# Patient Record
Sex: Female | Born: 1935 | Race: White | Hispanic: No | Marital: Married | Smoking: Never smoker
Health system: Southern US, Community
[De-identification: ages and names within clinical notes are randomized; demographics above are authoritative.]

## PROBLEM LIST (undated history)

## (undated) DIAGNOSIS — N189 Chronic kidney disease, unspecified: Secondary | ICD-10-CM

## (undated) DIAGNOSIS — K5900 Constipation, unspecified: Secondary | ICD-10-CM

## (undated) DIAGNOSIS — E079 Disorder of thyroid, unspecified: Secondary | ICD-10-CM

## (undated) DIAGNOSIS — G4700 Insomnia, unspecified: Secondary | ICD-10-CM

## (undated) DIAGNOSIS — E039 Hypothyroidism, unspecified: Secondary | ICD-10-CM

## (undated) DIAGNOSIS — H409 Unspecified glaucoma: Secondary | ICD-10-CM

## (undated) DIAGNOSIS — D649 Anemia, unspecified: Secondary | ICD-10-CM

## (undated) DIAGNOSIS — C539 Malignant neoplasm of cervix uteri, unspecified: Secondary | ICD-10-CM

## (undated) DIAGNOSIS — K219 Gastro-esophageal reflux disease without esophagitis: Secondary | ICD-10-CM

## (undated) DIAGNOSIS — I10 Essential (primary) hypertension: Secondary | ICD-10-CM

## (undated) DIAGNOSIS — M4800 Spinal stenosis, site unspecified: Secondary | ICD-10-CM

## (undated) DIAGNOSIS — I509 Heart failure, unspecified: Secondary | ICD-10-CM

## (undated) HISTORY — DX: Gastro-esophageal reflux disease without esophagitis: K21.9

## (undated) HISTORY — DX: Malignant neoplasm of cervix uteri, unspecified: C53.9

## (undated) HISTORY — DX: Chronic kidney disease, unspecified: N18.9

## (undated) HISTORY — PX: JOINT REPLACEMENT: SHX530

## (undated) HISTORY — DX: Constipation, unspecified: K59.00

## (undated) HISTORY — PX: ABDOMINAL HYSTERECTOMY: SHX81

## (undated) HISTORY — PX: HIP SURGERY: SHX245

## (undated) HISTORY — DX: Insomnia, unspecified: G47.00

## (undated) HISTORY — DX: Heart failure, unspecified: I50.9

## (undated) HISTORY — PX: BACK SURGERY: SHX140

## (undated) HISTORY — PX: PACEMAKER INSERTION: SHX728

## (undated) HISTORY — PX: SPINE SURGERY: SHX786

---

## 2003-06-04 ENCOUNTER — Encounter: Payer: Self-pay | Admitting: Neurosurgery

## 2003-06-04 ENCOUNTER — Encounter: Admission: RE | Admit: 2003-06-04 | Discharge: 2003-06-04 | Payer: Self-pay | Admitting: Neurosurgery

## 2003-06-19 ENCOUNTER — Encounter: Admission: RE | Admit: 2003-06-19 | Discharge: 2003-06-19 | Payer: Self-pay | Admitting: Neurosurgery

## 2003-06-19 ENCOUNTER — Encounter: Payer: Self-pay | Admitting: Neurosurgery

## 2004-03-11 ENCOUNTER — Ambulatory Visit (HOSPITAL_COMMUNITY): Admission: RE | Admit: 2004-03-11 | Discharge: 2004-03-11 | Payer: Self-pay | Admitting: Neurosurgery

## 2004-05-10 ENCOUNTER — Inpatient Hospital Stay (HOSPITAL_COMMUNITY): Admission: AD | Admit: 2004-05-10 | Discharge: 2004-05-12 | Payer: Self-pay | Admitting: Neurosurgery

## 2004-11-22 ENCOUNTER — Ambulatory Visit: Payer: Self-pay | Admitting: Gastroenterology

## 2005-06-21 ENCOUNTER — Ambulatory Visit: Payer: Self-pay | Admitting: Gastroenterology

## 2005-07-21 ENCOUNTER — Ambulatory Visit: Payer: Self-pay | Admitting: Internal Medicine

## 2006-07-23 ENCOUNTER — Ambulatory Visit: Payer: Self-pay | Admitting: Internal Medicine

## 2006-10-02 ENCOUNTER — Other Ambulatory Visit: Payer: Self-pay

## 2006-10-02 ENCOUNTER — Ambulatory Visit: Payer: Self-pay | Admitting: Unknown Physician Specialty

## 2007-07-04 ENCOUNTER — Ambulatory Visit: Payer: Self-pay

## 2007-08-05 ENCOUNTER — Ambulatory Visit: Payer: Self-pay

## 2007-09-10 ENCOUNTER — Ambulatory Visit: Payer: Self-pay | Admitting: Internal Medicine

## 2007-12-12 ENCOUNTER — Ambulatory Visit: Payer: Self-pay

## 2008-10-07 ENCOUNTER — Ambulatory Visit: Payer: Self-pay | Admitting: Internal Medicine

## 2009-02-25 ENCOUNTER — Ambulatory Visit: Payer: Self-pay | Admitting: Internal Medicine

## 2009-07-07 ENCOUNTER — Ambulatory Visit: Payer: Self-pay | Admitting: Internal Medicine

## 2009-12-08 ENCOUNTER — Ambulatory Visit: Payer: Self-pay | Admitting: Internal Medicine

## 2010-07-28 ENCOUNTER — Ambulatory Visit: Payer: Self-pay | Admitting: Internal Medicine

## 2010-08-02 ENCOUNTER — Ambulatory Visit: Payer: Self-pay | Admitting: Internal Medicine

## 2010-11-10 ENCOUNTER — Ambulatory Visit: Payer: Self-pay | Admitting: Internal Medicine

## 2011-01-30 ENCOUNTER — Ambulatory Visit: Payer: Self-pay | Admitting: Internal Medicine

## 2011-04-13 ENCOUNTER — Ambulatory Visit: Payer: Self-pay | Admitting: Vascular Surgery

## 2011-04-19 ENCOUNTER — Ambulatory Visit: Payer: Self-pay | Admitting: Vascular Surgery

## 2011-04-26 ENCOUNTER — Inpatient Hospital Stay: Payer: Self-pay | Admitting: Vascular Surgery

## 2011-04-28 LAB — PATHOLOGY REPORT

## 2011-08-04 ENCOUNTER — Ambulatory Visit: Payer: Self-pay | Admitting: Internal Medicine

## 2011-09-20 ENCOUNTER — Ambulatory Visit: Payer: Self-pay | Admitting: Internal Medicine

## 2011-09-25 ENCOUNTER — Ambulatory Visit: Payer: Self-pay | Admitting: Internal Medicine

## 2011-09-30 ENCOUNTER — Ambulatory Visit: Payer: Self-pay | Admitting: Internal Medicine

## 2011-11-20 ENCOUNTER — Ambulatory Visit: Payer: Self-pay | Admitting: Internal Medicine

## 2011-11-20 LAB — IRON AND TIBC
Iron Saturation: 27 %
Unbound Iron-Bind.Cap.: 275 ug/dL

## 2011-11-20 LAB — FERRITIN: Ferritin (ARMC): 66 ng/mL (ref 8–388)

## 2011-11-20 LAB — CANCER CENTER HEMOGLOBIN: HGB: 12.4 g/dL (ref 12.0–16.0)

## 2011-12-01 ENCOUNTER — Ambulatory Visit: Payer: Self-pay | Admitting: Internal Medicine

## 2011-12-24 ENCOUNTER — Observation Stay: Payer: Self-pay | Admitting: Internal Medicine

## 2011-12-24 LAB — CBC
HCT: 35.3 % (ref 35.0–47.0)
MCH: 30.6 pg (ref 26.0–34.0)
MCHC: 34 g/dL (ref 32.0–36.0)
MCV: 90 fL (ref 80–100)

## 2011-12-24 LAB — BASIC METABOLIC PANEL
BUN: 26 mg/dL — ABNORMAL HIGH (ref 7–18)
Chloride: 100 mmol/L (ref 98–107)
Co2: 25 mmol/L (ref 21–32)
Glucose: 95 mg/dL (ref 65–99)

## 2011-12-24 LAB — URINALYSIS, COMPLETE
Blood: NEGATIVE
Nitrite: NEGATIVE
Ph: 6 (ref 4.5–8.0)
RBC,UR: 1 /HPF (ref 0–5)
Squamous Epithelial: 10

## 2011-12-24 LAB — TROPONIN I: Troponin-I: <0.02 ng/mL

## 2011-12-25 LAB — LIPID PANEL
Cholesterol: 202 mg/dL — ABNORMAL HIGH (ref 0–200)
Ldl Cholesterol, Calc: 103 mg/dL — ABNORMAL HIGH (ref 0–100)
Triglycerides: 48 mg/dL (ref 0–200)

## 2011-12-25 LAB — BASIC METABOLIC PANEL
BUN: 16 mg/dL (ref 7–18)
Calcium, Total: 8.6 mg/dL (ref 8.5–10.1)
Chloride: 103 mmol/L (ref 98–107)
EGFR (Non-African Amer.): 59 — ABNORMAL LOW
Osmolality: 277 (ref 275–301)
Potassium: 4.5 mmol/L (ref 3.5–5.1)
Sodium: 139 mmol/L (ref 136–145)

## 2011-12-25 LAB — TROPONIN I: Troponin-I: <0.02 ng/mL

## 2012-01-15 ENCOUNTER — Ambulatory Visit: Payer: Self-pay | Admitting: Internal Medicine

## 2012-01-15 LAB — CBC CANCER CENTER
Basophil #: 0 x10 3/mm (ref 0.0–0.1)
Eosinophil #: 0.2 x10 3/mm (ref 0.0–0.7)
Lymphocyte #: 1.1 x10 3/mm (ref 1.0–3.6)
MCH: 30.8 pg (ref 26.0–34.0)
MCHC: 34.3 g/dL (ref 32.0–36.0)
MCV: 90 fL (ref 80–100)
Monocyte #: 0.4 x10 3/mm (ref 0.0–0.7)
Neutrophil #: 3.9 x10 3/mm (ref 1.4–6.5)
Platelet: 269 x10 3/mm (ref 150–440)
RDW: 13.7 % (ref 11.5–14.5)
WBC: 5.6 x10 3/mm (ref 3.6–11.0)

## 2012-01-15 LAB — RETICULOCYTES
Absolute Retic Count: 0.025 10*6/uL (ref 0.024–0.084)
Reticulocyte: 0.7 % (ref 0.5–1.5)

## 2012-01-15 LAB — IRON AND TIBC
Iron Saturation: 37 %
Iron: 136 ug/dL (ref 50–170)

## 2012-01-15 LAB — FERRITIN: Ferritin (ARMC): 64 ng/mL (ref 8–388)

## 2012-01-29 ENCOUNTER — Ambulatory Visit: Payer: Self-pay | Admitting: Internal Medicine

## 2012-01-31 ENCOUNTER — Ambulatory Visit: Payer: Self-pay | Admitting: Internal Medicine

## 2012-03-14 ENCOUNTER — Ambulatory Visit: Payer: Self-pay | Admitting: Internal Medicine

## 2012-06-21 IMAGING — CT CT HEAD WITHOUT CONTRAST
2 series · 16 of 30 positions shown, 20 images · non-contrast
Comparison: none

REASON FOR EXAM: syncope
COMMENTS:

PROCEDURE:     CT  - CT HEAD WITHOUT CONTRAST  - December 24, 2011 [DATE]
RESULT:     Comparison:  MRI of the brain 07/07/2009
TECHNIQUE: Multiple axial images from the foramen magnum to the vertex were
obtained without IV contrast.

[Series 2: without · axial · non-contrast · 0.40mm/px · z∈[+314,+434]mm · 13 of 30 slices shown, 17 images]
[im 3/30  brain]
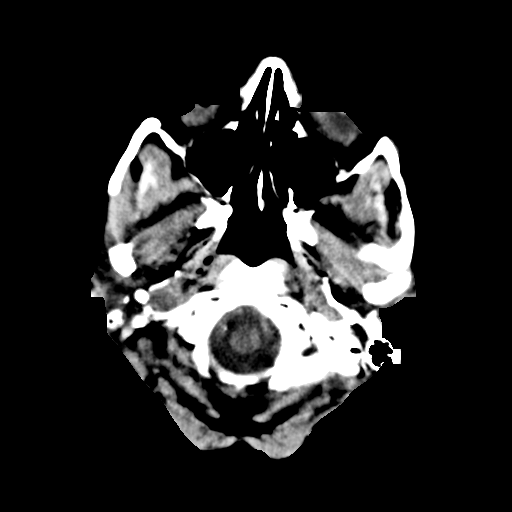
[im 3/30  bone]
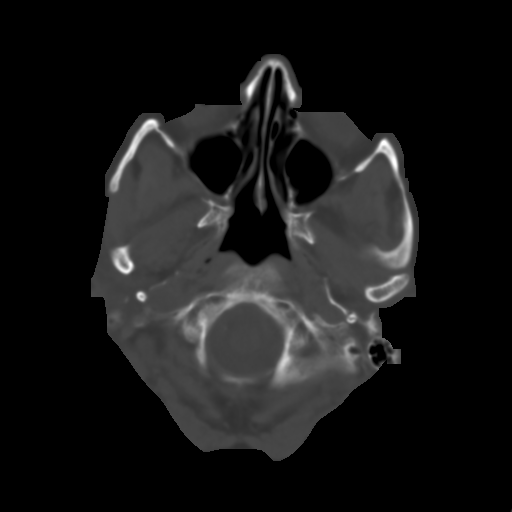
[im 5/30  brain]
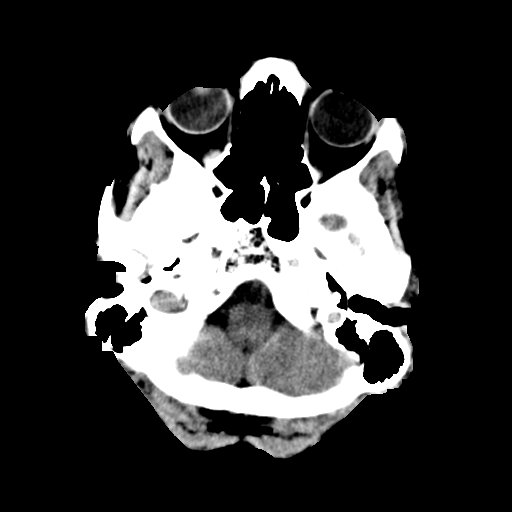
[im 7/30  brain]
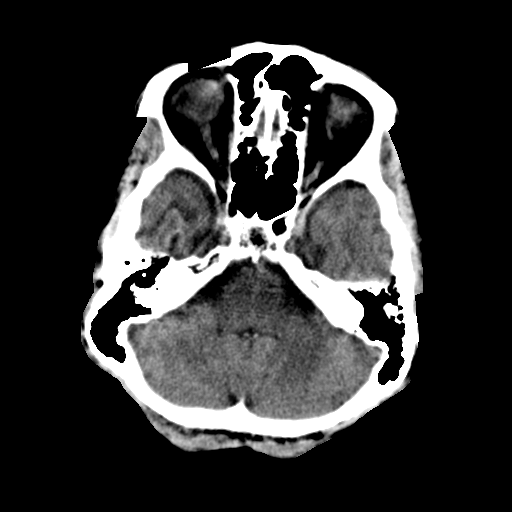
[im 9/30  brain]
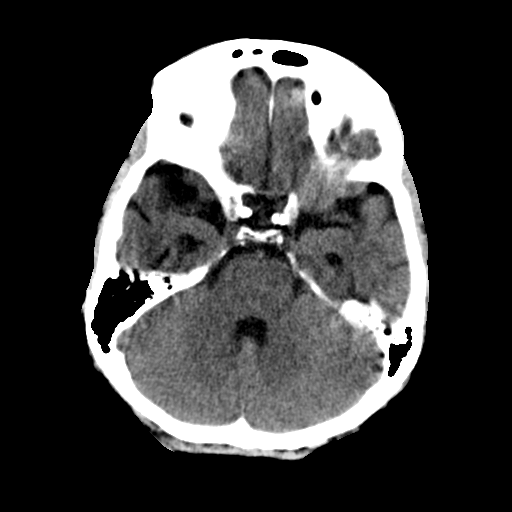
[im 11/30  brain]
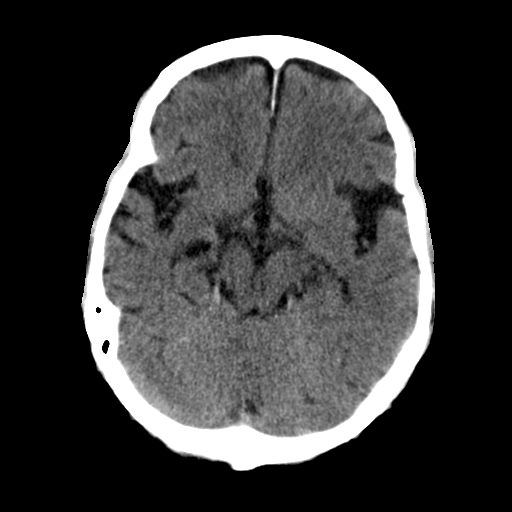
[im 11/30  bone]
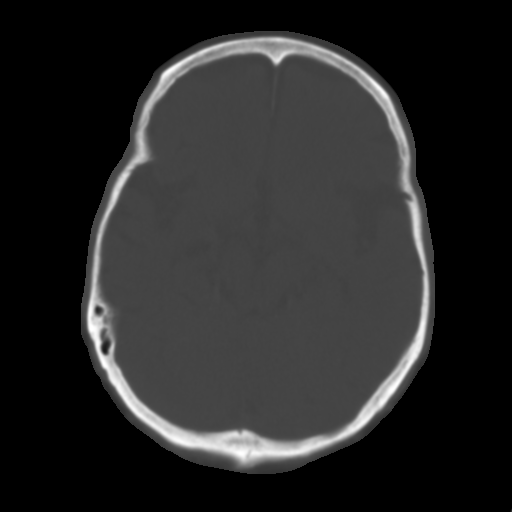
[im 13/30  brain]
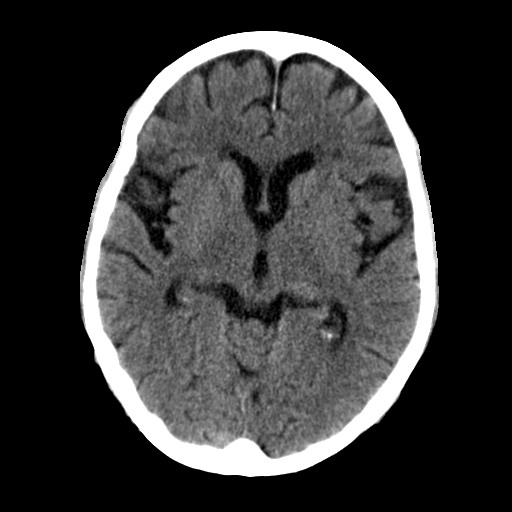
[im 15/30  brain]
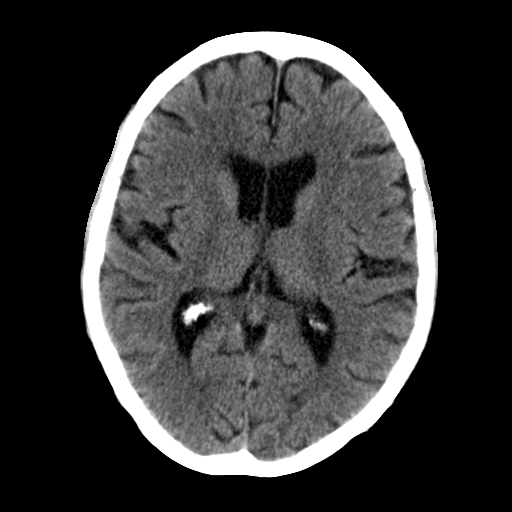
[im 17/30  brain]
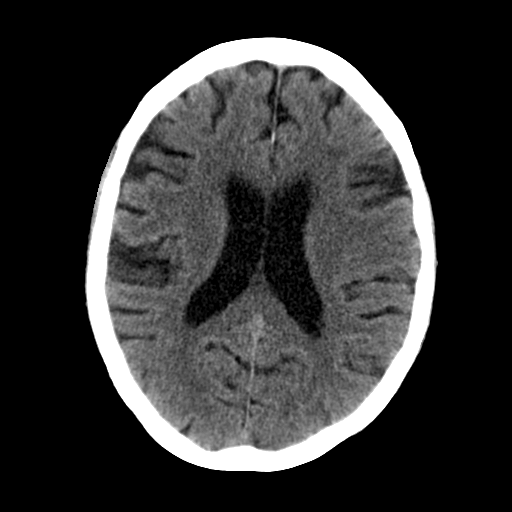
[im 19/30  brain]
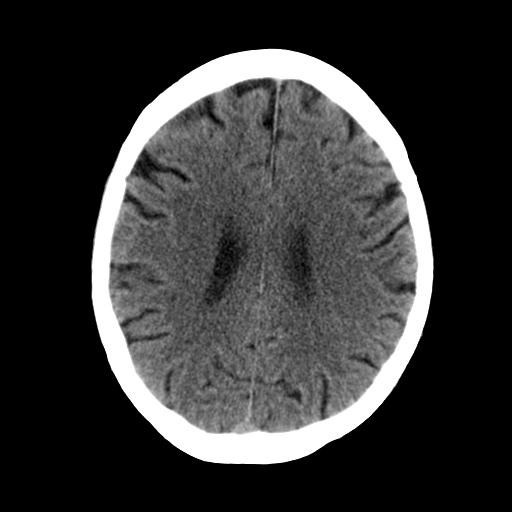
[im 19/30  bone]
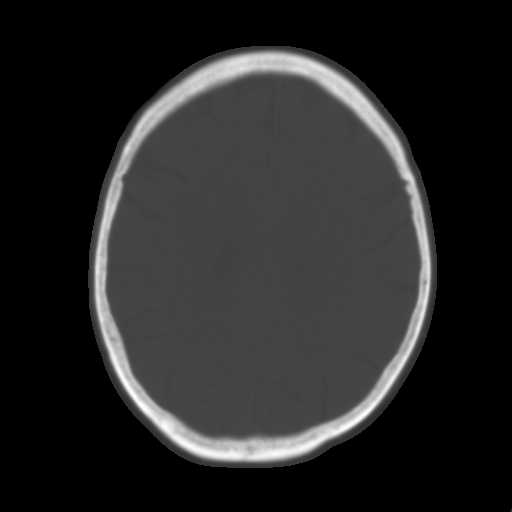
[im 21/30  brain]
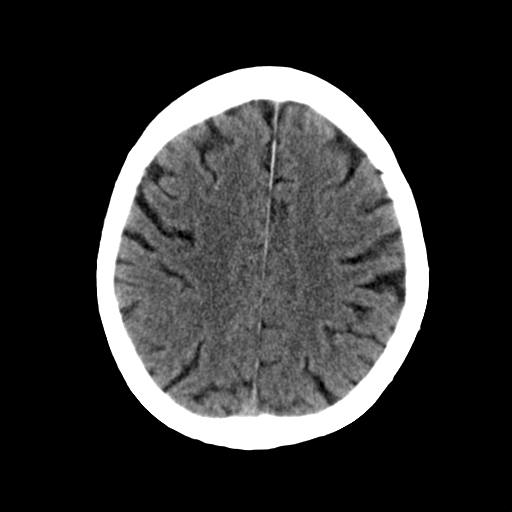
[im 23/30  brain]
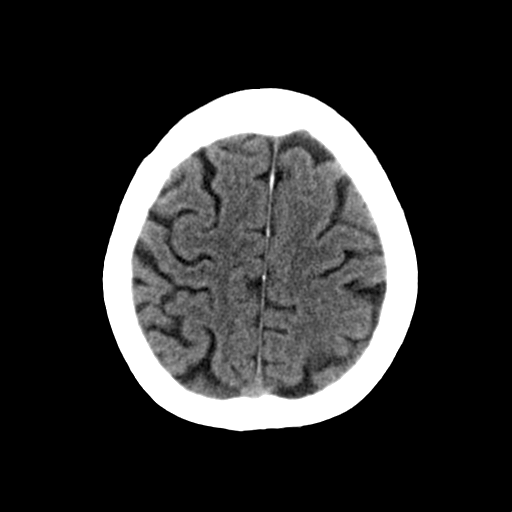
[im 25/30  brain]
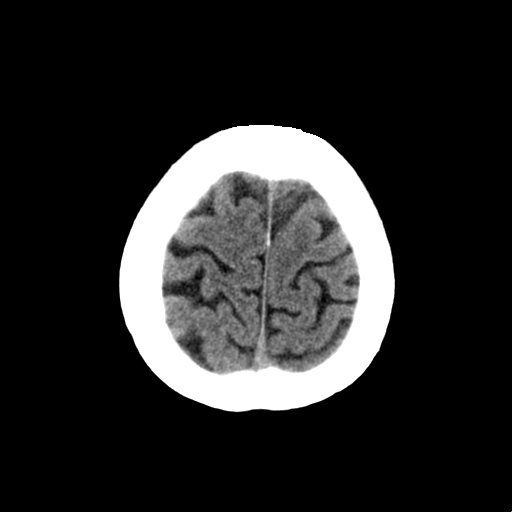
[im 27/30  brain]
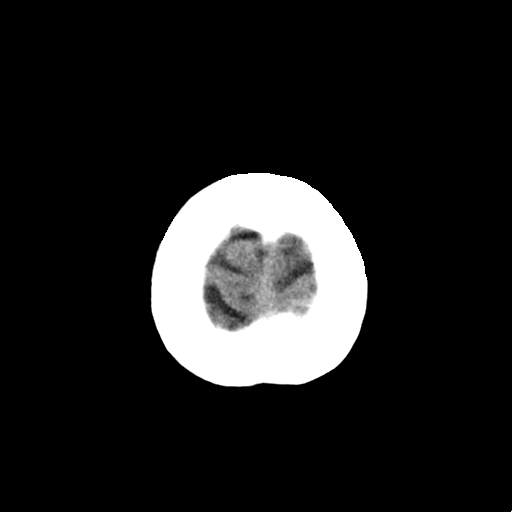
[im 27/30  bone]
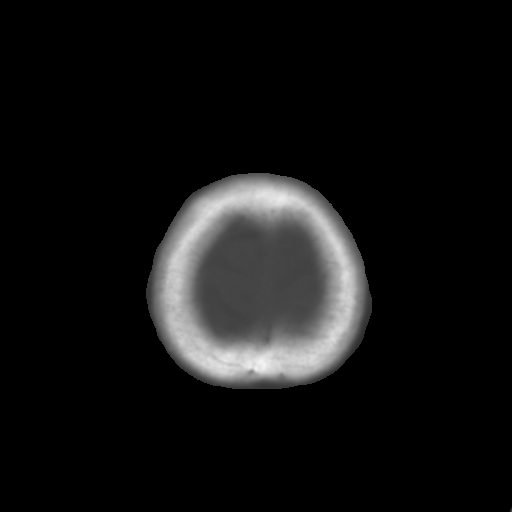

[Series 3: bone · axial · 0.40mm/px · z∈[+314,+354]mm · 3 of 30 slices shown]
[im 3/30  bone]
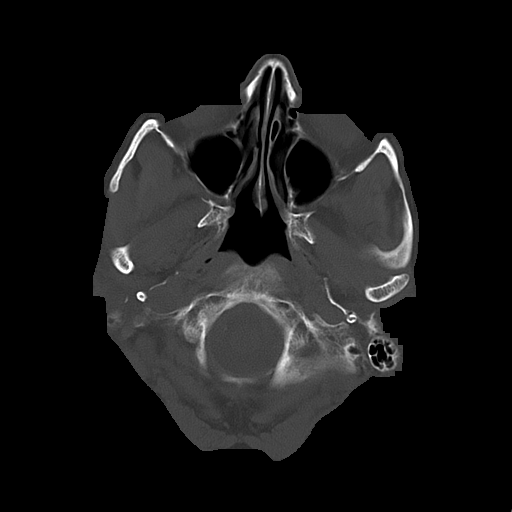
[im 7/30  bone]
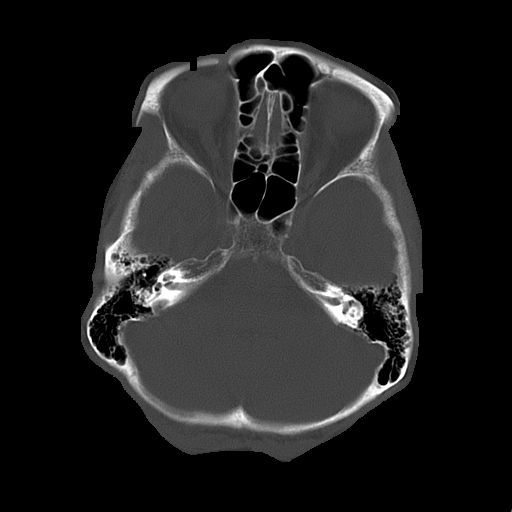
[im 11/30  bone]
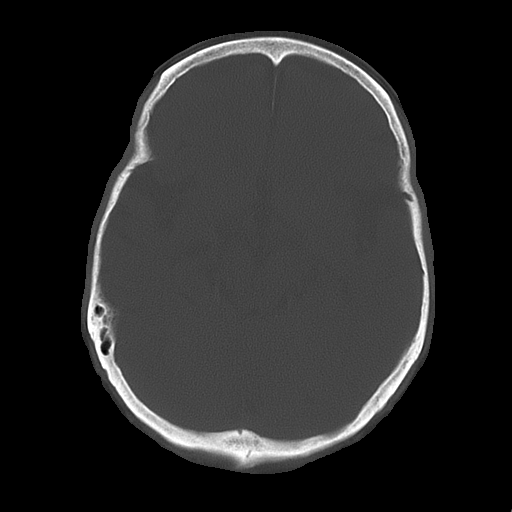

[16 of 30 positions shown; findings below may reference images not displayed]

FINDINGS: There is no evidence for mass effect, midline shift, or extra-axial fluid
collections. There is no evidence for space-occupying lesion, intracranial
hemorrhage, or cortical-based area of infarction. Mild periventricular and
subcortical hypoattenuation is likely sequela of chronic microangiopathy.

The osseous structures are unremarkable.
IMPRESSION: No acute intracranial process.

## 2012-06-24 ENCOUNTER — Ambulatory Visit: Payer: Self-pay | Admitting: Gastroenterology

## 2013-01-28 DEATH — deceased

## 2013-02-03 ENCOUNTER — Ambulatory Visit: Payer: Self-pay | Admitting: Internal Medicine

## 2013-08-13 ENCOUNTER — Encounter: Payer: Self-pay | Admitting: Otolaryngology

## 2013-08-30 ENCOUNTER — Encounter: Payer: Self-pay | Admitting: Otolaryngology

## 2013-10-01 ENCOUNTER — Ambulatory Visit: Payer: Self-pay | Admitting: Podiatry

## 2013-10-15 ENCOUNTER — Ambulatory Visit: Payer: Self-pay | Admitting: Physician Assistant

## 2013-12-26 ENCOUNTER — Inpatient Hospital Stay: Payer: Self-pay | Admitting: Internal Medicine

## 2013-12-26 LAB — CBC
HCT: 31.1 % — ABNORMAL LOW (ref 35.0–47.0)
HGB: 10.6 g/dL — AB (ref 12.0–16.0)
MCH: 30.6 pg (ref 26.0–34.0)
MCHC: 34.2 g/dL (ref 32.0–36.0)
MCV: 89 fL (ref 80–100)
Platelet: 265 10*3/uL (ref 150–440)
RBC: 3.48 10*6/uL — ABNORMAL LOW (ref 3.80–5.20)
RDW: 13.8 % (ref 11.5–14.5)
WBC: 15.5 10*3/uL — AB (ref 3.6–11.0)

## 2013-12-26 LAB — COMPREHENSIVE METABOLIC PANEL
ALBUMIN: 3 g/dL — AB (ref 3.4–5.0)
ALK PHOS: 157 U/L — AB
ALT: 58 U/L (ref 12–78)
ANION GAP: 8 (ref 7–16)
BUN: 23 mg/dL — ABNORMAL HIGH (ref 7–18)
Bilirubin,Total: 0.6 mg/dL (ref 0.2–1.0)
CHLORIDE: 94 mmol/L — AB (ref 98–107)
CO2: 24 mmol/L (ref 21–32)
Calcium, Total: 8.5 mg/dL (ref 8.5–10.1)
Creatinine: 1.3 mg/dL (ref 0.60–1.30)
EGFR (African American): 44 — ABNORMAL LOW
EGFR (Non-African Amer.): 38 — ABNORMAL LOW
Glucose: 154 mg/dL — ABNORMAL HIGH (ref 65–99)
Osmolality: 260 (ref 275–301)
POTASSIUM: 4.1 mmol/L (ref 3.5–5.1)
SGOT(AST): 73 U/L — ABNORMAL HIGH (ref 15–37)
Sodium: 126 mmol/L — ABNORMAL LOW (ref 136–145)
Total Protein: 7 g/dL (ref 6.4–8.2)

## 2013-12-26 LAB — URINALYSIS, COMPLETE
BILIRUBIN, UR: NEGATIVE
GLUCOSE, UR: NEGATIVE mg/dL (ref 0–75)
Ketone: NEGATIVE
NITRITE: NEGATIVE
Ph: 5 (ref 4.5–8.0)
RBC,UR: 25 /HPF (ref 0–5)
SPECIFIC GRAVITY: 1.011 (ref 1.003–1.030)
Squamous Epithelial: 1
WBC UR: 363 /HPF (ref 0–5)

## 2013-12-26 LAB — TROPONIN I
TROPONIN-I: 0.1 ng/mL — AB
TROPONIN-I: 0.22 ng/mL — AB

## 2013-12-26 LAB — CK TOTAL AND CKMB (NOT AT ARMC)
CK, TOTAL: 84 U/L
CK, TOTAL: 106 U/L
CK-MB: 0.7 ng/mL (ref 0.5–3.6)
CK-MB: 1.4 ng/mL (ref 0.5–3.6)

## 2013-12-27 LAB — CBC WITH DIFFERENTIAL/PLATELET
BASOS PCT: 0.3 %
Basophil #: 0 10*3/uL (ref 0.0–0.1)
EOS PCT: 0.2 %
Eosinophil #: 0 10*3/uL (ref 0.0–0.7)
HCT: 27.5 % — AB (ref 35.0–47.0)
HGB: 9.1 g/dL — ABNORMAL LOW (ref 12.0–16.0)
LYMPHS PCT: 8 %
Lymphocyte #: 1.2 10*3/uL (ref 1.0–3.6)
MCH: 29.6 pg (ref 26.0–34.0)
MCHC: 33.1 g/dL (ref 32.0–36.0)
MCV: 89 fL (ref 80–100)
MONOS PCT: 9.7 %
Monocyte #: 1.4 x10 3/mm — ABNORMAL HIGH (ref 0.2–0.9)
NEUTROS PCT: 81.8 %
Neutrophil #: 11.9 10*3/uL — ABNORMAL HIGH (ref 1.4–6.5)
PLATELETS: 219 10*3/uL (ref 150–440)
RBC: 3.07 10*6/uL — AB (ref 3.80–5.20)
RDW: 13.7 % (ref 11.5–14.5)
WBC: 14.5 10*3/uL — ABNORMAL HIGH (ref 3.6–11.0)

## 2013-12-27 LAB — BASIC METABOLIC PANEL
Anion Gap: 8 (ref 7–16)
BUN: 22 mg/dL — AB (ref 7–18)
CALCIUM: 7.9 mg/dL — AB (ref 8.5–10.1)
CHLORIDE: 99 mmol/L (ref 98–107)
Co2: 21 mmol/L (ref 21–32)
Creatinine: 1.31 mg/dL — ABNORMAL HIGH (ref 0.60–1.30)
GFR CALC AF AMER: 44 — AB
GFR CALC NON AF AMER: 38 — AB
GLUCOSE: 117 mg/dL — AB (ref 65–99)
OSMOLALITY: 261 (ref 275–301)
Potassium: 4.1 mmol/L (ref 3.5–5.1)
SODIUM: 128 mmol/L — AB (ref 136–145)

## 2013-12-27 LAB — CK TOTAL AND CKMB (NOT AT ARMC)
CK, Total: 139 U/L
CK-MB: 2.1 ng/mL (ref 0.5–3.6)

## 2013-12-27 LAB — TROPONIN I: TROPONIN-I: 0.14 ng/mL — AB

## 2013-12-28 LAB — BASIC METABOLIC PANEL
ANION GAP: 9 (ref 7–16)
BUN: 11 mg/dL (ref 7–18)
CALCIUM: 8.1 mg/dL — AB (ref 8.5–10.1)
CO2: 21 mmol/L (ref 21–32)
CREATININE: 1.06 mg/dL (ref 0.60–1.30)
Chloride: 100 mmol/L (ref 98–107)
EGFR (African American): 57 — ABNORMAL LOW
EGFR (Non-African Amer.): 49 — ABNORMAL LOW
Glucose: 89 mg/dL (ref 65–99)
OSMOLALITY: 260 (ref 275–301)
POTASSIUM: 3.7 mmol/L (ref 3.5–5.1)
Sodium: 130 mmol/L — ABNORMAL LOW (ref 136–145)

## 2013-12-28 LAB — CBC WITH DIFFERENTIAL/PLATELET
BASOS ABS: 0 10*3/uL (ref 0.0–0.1)
Basophil %: 0.3 %
Eosinophil #: 0.1 10*3/uL (ref 0.0–0.7)
Eosinophil %: 0.7 %
HCT: 27.4 % — AB (ref 35.0–47.0)
HGB: 9.1 g/dL — AB (ref 12.0–16.0)
Lymphocyte #: 1 10*3/uL (ref 1.0–3.6)
Lymphocyte %: 8.3 %
MCH: 29.7 pg (ref 26.0–34.0)
MCHC: 33.3 g/dL (ref 32.0–36.0)
MCV: 89 fL (ref 80–100)
MONO ABS: 1 x10 3/mm — AB (ref 0.2–0.9)
MONOS PCT: 8.6 %
Neutrophil #: 9.7 10*3/uL — ABNORMAL HIGH (ref 1.4–6.5)
Neutrophil %: 82.1 %
PLATELETS: 251 10*3/uL (ref 150–440)
RBC: 3.07 10*6/uL — ABNORMAL LOW (ref 3.80–5.20)
RDW: 14 % (ref 11.5–14.5)
WBC: 11.8 10*3/uL — ABNORMAL HIGH (ref 3.6–11.0)

## 2013-12-28 LAB — URINE CULTURE

## 2013-12-29 LAB — BASIC METABOLIC PANEL
ANION GAP: 7 (ref 7–16)
BUN: 11 mg/dL (ref 7–18)
CALCIUM: 8.5 mg/dL (ref 8.5–10.1)
CO2: 24 mmol/L (ref 21–32)
CREATININE: 1.05 mg/dL (ref 0.60–1.30)
Chloride: 104 mmol/L (ref 98–107)
EGFR (Non-African Amer.): 49 — ABNORMAL LOW
GFR CALC AF AMER: 57 — AB
GLUCOSE: 82 mg/dL (ref 65–99)
OSMOLALITY: 269 (ref 275–301)
Potassium: 3.6 mmol/L (ref 3.5–5.1)
Sodium: 135 mmol/L — ABNORMAL LOW (ref 136–145)

## 2013-12-29 LAB — CBC WITH DIFFERENTIAL/PLATELET
BASOS ABS: 0 10*3/uL (ref 0.0–0.1)
BASOS PCT: 0.4 %
EOS ABS: 0.4 10*3/uL (ref 0.0–0.7)
EOS PCT: 5.2 %
HCT: 26.3 % — ABNORMAL LOW (ref 35.0–47.0)
HGB: 9.2 g/dL — ABNORMAL LOW (ref 12.0–16.0)
Lymphocyte #: 1.2 10*3/uL (ref 1.0–3.6)
Lymphocyte %: 14.8 %
MCH: 30.9 pg (ref 26.0–34.0)
MCHC: 35 g/dL (ref 32.0–36.0)
MCV: 88 fL (ref 80–100)
Monocyte #: 0.9 x10 3/mm (ref 0.2–0.9)
Monocyte %: 11.1 %
NEUTROS ABS: 5.8 10*3/uL (ref 1.4–6.5)
NEUTROS PCT: 68.5 %
PLATELETS: 240 10*3/uL (ref 150–440)
RBC: 2.98 10*6/uL — AB (ref 3.80–5.20)
RDW: 13.8 % (ref 11.5–14.5)
WBC: 8.4 10*3/uL (ref 3.6–11.0)

## 2013-12-31 LAB — CULTURE, BLOOD (SINGLE)

## 2014-03-26 ENCOUNTER — Encounter: Payer: Self-pay | Admitting: Internal Medicine

## 2014-03-30 ENCOUNTER — Encounter: Payer: Self-pay | Admitting: Internal Medicine

## 2014-03-31 LAB — COMPREHENSIVE METABOLIC PANEL
ALBUMIN: 2.8 g/dL — AB (ref 3.4–5.0)
ALK PHOS: 74 U/L
ANION GAP: 8 (ref 7–16)
BUN: 17 mg/dL (ref 7–18)
Bilirubin,Total: 0.7 mg/dL (ref 0.2–1.0)
CALCIUM: 8.9 mg/dL (ref 8.5–10.1)
CHLORIDE: 103 mmol/L (ref 98–107)
Co2: 26 mmol/L (ref 21–32)
Creatinine: 1.02 mg/dL (ref 0.60–1.30)
EGFR (African American): 59 — ABNORMAL LOW
GFR CALC NON AF AMER: 51 — AB
Glucose: 91 mg/dL (ref 65–99)
Osmolality: 275 (ref 275–301)
POTASSIUM: 4 mmol/L (ref 3.5–5.1)
SGOT(AST): 82 U/L — ABNORMAL HIGH (ref 15–37)
SGPT (ALT): 108 U/L — ABNORMAL HIGH (ref 12–78)
SODIUM: 137 mmol/L (ref 136–145)
TOTAL PROTEIN: 6.1 g/dL — AB (ref 6.4–8.2)

## 2014-03-31 LAB — CBC WITH DIFFERENTIAL/PLATELET
Basophil #: 0 10*3/uL (ref 0.0–0.1)
Basophil %: 0.4 %
EOS ABS: 0.1 10*3/uL (ref 0.0–0.7)
EOS PCT: 2.4 %
HCT: 26.9 % — AB (ref 35.0–47.0)
HGB: 9.1 g/dL — ABNORMAL LOW (ref 12.0–16.0)
LYMPHS PCT: 28 %
Lymphocyte #: 1.8 10*3/uL (ref 1.0–3.6)
MCH: 30.2 pg (ref 26.0–34.0)
MCHC: 33.8 g/dL (ref 32.0–36.0)
MCV: 89 fL (ref 80–100)
MONO ABS: 0.6 x10 3/mm (ref 0.2–0.9)
Monocyte %: 9.2 %
Neutrophil #: 3.8 10*3/uL (ref 1.4–6.5)
Neutrophil %: 60 %
PLATELETS: 366 10*3/uL (ref 150–440)
RBC: 3.01 10*6/uL — AB (ref 3.80–5.20)
RDW: 13.6 % (ref 11.5–14.5)
WBC: 6.3 10*3/uL (ref 3.6–11.0)

## 2014-03-31 LAB — TSH: Thyroid Stimulating Horm: 0.697 u[IU]/mL

## 2014-04-28 ENCOUNTER — Encounter: Payer: Self-pay | Admitting: Orthopedic Surgery

## 2014-04-29 ENCOUNTER — Encounter: Payer: Self-pay | Admitting: Orthopedic Surgery

## 2014-05-30 ENCOUNTER — Encounter: Payer: Self-pay | Admitting: Orthopedic Surgery

## 2014-06-30 ENCOUNTER — Encounter: Payer: Self-pay | Admitting: Orthopedic Surgery

## 2014-07-28 ENCOUNTER — Ambulatory Visit: Payer: Self-pay | Admitting: Internal Medicine

## 2014-07-30 ENCOUNTER — Encounter: Payer: Self-pay | Admitting: Orthopedic Surgery

## 2014-08-13 ENCOUNTER — Ambulatory Visit: Payer: Self-pay | Admitting: Internal Medicine

## 2014-12-09 ENCOUNTER — Ambulatory Visit: Payer: Self-pay | Admitting: Ophthalmology

## 2014-12-24 ENCOUNTER — Ambulatory Visit: Payer: Self-pay | Admitting: Internal Medicine

## 2015-02-20 NOTE — H&P (Signed)
PATIENT NAME:  Courtney Manning, BEAUDOIN MR#:  [id number] DATE OF BIRTH:  22-Jun-1929  DATE OF ADMISSION:  12/26/2013  Addendum  Troponin is 0.10 and also has some ST depression in lateral leads, likely demand ischemia with her sepsis. The patient will have 2 more sets of troponins, along with CK and wee will order echocardiogram. If the patient's troponins go up, the patient needs cardiology evaluation, and she will be started on small dose beta blockers and also aspirin. The patient had surgery of her back in December, but aspirin was never started. I think it can be started now. It is already 2 months since her surgery.     ____________________________ Epifanio Lesches, MD sk:dmm D: 12/26/2013 18:25:18 ET T: 12/26/2013 19:40:26 ET JOB#: [id number]  cc: Epifanio Lesches, MD, <Dictator> Epifanio Lesches MD ELECTRONICALLY SIGNED 01/12/2014 12:35

## 2015-02-20 NOTE — Discharge Summary (Signed)
PATIENT NAME:  Courtney Manning, Courtney Manning MR#:  [id number] DATE OF BIRTH:  [birth year]  DATE OF ADMISSION:  12/26/2013 DATE OF DISCHARGE:  12/29/2013  REASON FOR ADMISSION: Fever with urinary tract infection.   HISTORY OF PRESENT ILLNESS: Please see the dictated HPI done by Dr. Vianne Bulls on 12/26/2013.   PAST MEDICAL HISTORY:  1. Benign hypertension.  2. Glaucoma.  3. Hypothyroidism.  4. Depression.  5. Anxiety.  6. Status post hysterectomy.  7. Status post appendectomy.  8. Peripheral vascular disease, status post right carotid endarterectomy.  9. Status post recent spinal surgery.   MEDICATIONS ON ADMISSION: Please see admission note.   ALLERGIES: CODEINE, MORPHINE, OMEPRAZOLE, PANTOPRAZOLE AND PROPOXYPHENE.   SOCIAL HISTORY, FAMILY HISTORY AND REVIEW OF SYSTEMS: As per admission note.   PHYSICAL EXAMINATION:  GENERAL: The patient was in no acute distress.  VITAL SIGNS: Remarkable for temperature of 99.9 with a heart rate of 129, blood pressure of 177/69, saturating 97% on room air.  HEENT: Unremarkable.  NECK: Supple, without JVD.  LUNGS: Clear.  CARDIAC: Revealed a rapid rate with a regular rhythm. Normal S1, S2.  ABDOMEN: Soft and nontender.  EXTREMITIES: Without edema.  NEUROLOGIC EXAM: Grossly nonfocal.   HOSPITAL COURSE: The patient was admitted with SIRS due to UTI. She was started on IV fluids with IV antibiotics. The patient improved with conservative therapy. She did have a mildly elevated troponin, which was felt to be due to demand ischemia, with no evidence of an acute MI. The patient did well with conservative therapy. No chest pain or shortness of breath. Her urine culture grew out E. coli. Blood cultures negative. She was switched to oral antibiotics and did well. By 12/29/2013, the patient was stable and ready for discharge.   DISCHARGE DIAGNOSES:  1. Systemic inflammatory response syndrome.  2. Escherichia coli urinary tract infection.  3. Chronic anemia.  4.  Hypothyroidism.  5. Demand ischemia with elevated troponin.   DISCHARGE MEDICATIONS:  1. Metoprolol 25 mg p.o. b.i.d.  2. Armour Thyroid 60 mg p.o. daily.  3. Ceftin 250 mg p.o. b.i.d. for 10 days.  4. Zofran 4 mg p.o. q.4 hours p.r.n. nausea and vomiting.   FOLLOWUP PLANS AND APPOINTMENTS: The patient was discharged on a low-sodium diet. She will follow up with me in the office in 3 to 4 days, sooner if needed.   ____________________________ Leonie Douglas Doy Hutching, MD jds:lb D: 01/06/2014 12:16:48 ET T: 01/06/2014 13:16:24 ET JOB#: [id number]  cc: Leonie Douglas. Doy Hutching, MD, <Dictator> JEFFREY Lennice Sites MD ELECTRONICALLY SIGNED 01/06/2014 15:22

## 2015-02-20 NOTE — H&P (Signed)
PATIENT NAME:  Courtney Manning, Courtney Manning MR#:  [id number] DATE OF BIRTH:  June 26, 1929  DATE OF ADMISSION:  12/26/2013  PRIMARY CARE PHYSICIAN: Leonie Douglas. Doy Hutching, MD   EMERGENCY ROOM PHYSICIAN: Verdia Kuba. Paduchowski, MD  REASON FOR ADMISSION: Fever and UTI.   HISTORY OF PRESENT ILLNESS: The patient is an 81 year old female patient who came in because of fever, chills, and possible urinary infection. The patient has been having some dysuria, incontinence. Went to Dr. Doy Hutching' office 2 days ago. Today he called in the antibiotics, but the patient had a fever of 102 with chills, so Dr. Doy Hutching asked her to come to the Emergency Room. In the ER, the patient's heart rate was in 120s when she came, and the patient also had white count elevated up to 15 and sodium was 126. The patient's urine culture showed E. coli, and Dr. Doy Hutching says it is pansensitive. The patient right now says she is feeling a little better. Denies any nausea or vomiting. No abdominal pain. Did have some urinary incontinence and fever today, and the patient had lots of chills and fever.    PAST MEDICAL HISTORY: Significant for history of hypertension, glaucoma, hypothyroidism.  ALLERGIES: CODEINE, MORPHINE, OMEPRAZOLE, PANTOPRAZOLE,   PROPOXYPHENE.   PAST SURGICAL HISTORY: Significant for appendectomy, hysterectomy, back surgery, right carotid endarterectomy. Surgical history also includes recent spinal surgery in December, and she is taking oxycodone for that.   SOCIAL HISTORY: No smoking. No drinking. Lives with her husband.   FAMILY HISTORY: Brother had diabetes.  MEDICATIONS: Include:  1.  Thyroid medicine 60 mg daily. 2.  Calcium with magnesium 1 gram/500 mg and vitamin D 400 international units daily.  3.  Losartan 100 mg p.o. daily.  4.  Melatonin 3 mg p.o. daily.  5.  Probiotic capsules.  6.  She also says that she takes oxycodone for back pain.  7.  Her medication includes aspirin and alpha-1 eye drops and progesterone, but she  says that she does not take aspirin. She does not take eye drops anymore. She also does not take progesterone.   REVIEW OF SYSTEMS:  CONSTITUTIONAL: Had a fever and chills today.  EYES: No blurred vision. Has a history of glaucoma. Her recent eye pressure was 6 according to her husband.  EARS, NOSE, THROAT: No tinnitus. No epistaxis. No difficulty swallowing.  RESPIRATORY: No cough. No wheezing.  CARDIOVASCULAR: No chest pain, no orthopnea, no PND, no pedal edema.  GASTROINTESTINAL: No nausea. No vomiting. No abdominal pain.  GENITOURINARY: The patient had dysuria and also incontinence of urine.  ENDOCRINE: No polyuria or nocturia.  HEMATOLOGIC: No anemia. INTEGUMENTARY: No skin rashes.  MUSCULOSKELETAL: Has back pain.  NEUROLOGIC: No numbness or weakness.  PSYCHIATRIC: No anxiety or insomnia.   PHYSICAL EXAMINATION: VITAL SIGNS: Temperature 99.9 in the ER. Heart rate is 129, blood pressure 177/69, sats 97% on room air.  GENERAL: She is alert, awake, oriented female, not in distress, answering questions appropriately.  HEAD: Normocephalic, atraumatic.  EYES: Pupils equally reacting to light. No conjunctival pallor.  NOSE: No nasal lesions. No drainage.  MOUTH: No lesions. No drainage.  NECK: Supple. No JVD. No carotid bruit. Normal range of motion.  RESPIRATORY: Clear to auscultation. No wheeze. No rales.  CARDIOVASCULAR: S1, S2, regular, slightly tachycardic. No murmurs are heard.  GASTROINTESTINAL: Abdomen is soft, nontender, nondistended. Bowel sounds present. The patient does not have suprapubic tenderness.  MUSCULOSKELETAL: The patient does have lower LS-spine tenderness, especially on the left side, but range of motion  is normal.  SKIN: Within normal limits, but decreased skin turgor.  LYMPHATICS: No lymphadenopathy.  NEUROLOGIC: Cranial nerves II through XII intact. Power 5/5 in upper and lower extremities. Sensations are intact. DTRs are 2+ bilaterally. PSYCHIATRIC: Mood and  affect are within normal limits.   LABORATORY DATA: WBC 15.5, hemoglobin 10.6, hematocrit 31.1, platelets 265. Electrolytes: Sodium 126, potassium 4.1, chloride 94, bicarbonate 24, BUN 23, creatinine 1.30,glucose 154. AST is slightly up at 73. Troponin is 0.10. EKG shows ST depression in lateral leads V4, V5, and V6, sinus tachycardia, 128 beats per minute.   ASSESSMENT AND PLAN: The patient is an 81 year old female patient with:  1.  Systemic inflammatory response syndrome  secondary to urinary tract infection. The patient is going to be admitted to the hospitalist service on telemetry. Continue IV fluids, normal saline at 100 mL/h. Follow urine cultures.  2.  Urinary tract infection: The patient reportedly had urine culture showing Escherichia coli, so we are going to start her on Rocephin and Dr. Doy Hutching will resume the care tomorrow.  3.  Hyponatremia secondary to systemic inflammatory response syndrome and urinary tract infection: Hold the losartan. Continue fluids. Check met-B tomorrow morning.  4.  Chronic back pain: The patient is on oxycodone. We can continue that.  5.  Hypothyroidism: Continue Armour Thyroid.  6.  Gastrointestinal prophylaxis and deep vein thrombosis prophylaxis.   TIME SPENT ON THIS ADMISSION: 60 minutes. Will turn over the case to Dr. Doy Hutching.   ____________________________ Epifanio Lesches, MD sk:jcm D: 12/26/2013 18:19:54 ET T: 12/26/2013 19:50:55 ET JOB#: [id number]  cc: Epifanio Lesches, MD, <Dictator> Epifanio Lesches MD ELECTRONICALLY SIGNED 01/12/2014 12:40

## 2015-02-21 NOTE — H&P (Signed)
PATIENT NAME:  Courtney Manning, Courtney Manning MR#:  [id number] DATE OF BIRTH:  17-Jan-1929  DATE OF ADMISSION:  12/24/2011  PRIMARY CARE PHYSICIAN:   Dr. Doy Hutching.  REFERRING PHYSICIAN: Dr. Reita Cliche.   CHIEF COMPLAINT: Passed out today.   HISTORY OF PRESENT ILLNESS: The patient is an 81 year old Caucasian female with a history of hypertension, carotid stenosis on the right side status post endarterectomy, hyperlipidemia and hypothyroidism who presented to the ED with above chief complaint. The patient was in church this morning about 10:00. She passed out for about 15 minutes, but she does not know what happened to her. According to her husband, the patient was fine until she passed out. He did not notice any abnormal issue about her before the syncope episode. He only noticed that she felt warm and took off her clothes. The syncope lasted about 15 minutes, but no seizure, incontinence, shaking or tremor. After the patient woke up, she could not stand on her own. The patient denies any headache but has dizziness. She said she has hypertension and is taking hypertension medication depending on her blood pressure. Sometimes her blood pressure decreased to 90's , so she did not take medication. This morning she did not take any hypertension medication. She did mention that she had funny feeling in her chest this morning, but denies any chest pain, palpitation, orthopnea or nocturnal dyspnea. No cough, sputum, or hematemesis. She denies any other symptoms. The patient was sent to the ED for further evaluation. According to ED physician, Dr. Reita Cliche, work-up so far is negative, but since the patient has never had syncope and cardiac work-up, she admitted the patient for further work-up.   PAST MEDICAL HISTORY:  1. Hypertension.  2. Carotid stenosis. 3. Hyperlipidemia. 4. Hypothyroidism.   PAST SURGICAL HISTORY:  1. The patient has multiple surgeries including right carotid endarterectomy last June.  2. Appendectomy.   3. Hysterectomy. 4. Back surgery.   SOCIAL HISTORY: Denies any smoking, alcohol drinking, or illicit drugs.   FAMILY HISTORY: Brother has diabetes.   ALLERGIES: Codeine, morphine, Omeprazole, pantoprazole, Propoxyphene.    MEDICATIONS: Please see medication list.   REVIEW OF SYSTEMS: CONSTITUTIONAL: The patient denies any fever, chills. No headache, but has dizziness and weakness. HEENT: No double vision, blurred vision, epistaxis or discharge from ear or nose. No postnasal drip. No dysphagia or slurred speech. RESPIRATORY: No cough, sputum, shortness of breath or hemoptysis. CARDIOVASCULAR: No chest pain, palpitation, orthopnea, or nocturnal dyspnea. No leg edema. GASTROINTESTINAL: No nausea, vomiting, abdominal pain or diarrhea. No melena. No bloody stool. GU: No dysuria or hematuria. No incontinence. ENDOCRINE: No polyuria, polydipsia, or heat or cold intolerance. HEMATOLOGY: No easy bruising or bleeding. NEUROLOGY: Positive for syncope, loss of consciousness, but no seizure. PSYCH: No depression or anxiety.   PHYSICAL EXAMINATION:  VITAL SIGNS: Temperature 97.7, blood pressure 158/58 on lying position, on sitting position blood pressure 162/63, on standing position blood pressure decreased to 116/64.   GENERAL: The patient is alert, awake, oriented, in no acute distress.   HEENT: Pupils are round, equal, and reactive to light and accommodation. Moist oral mucosa. Clear oropharynx.   NECK: Supple. No JVD, but has carotid bruits on the left side. No thyromegaly. No lymph node.   CARDIOVASCULAR: S1, S2 regular rate and rhythm. No murmurs or gallops.   PULMONARY: Bilateral air entry. No wheezing or rales.   ABDOMEN: Soft. No distention or tenderness. No organomegaly. Bowel sounds present.   EXTREMITIES: No edema, clubbing, or cyanosis. No calf tenderness.  Strong bilateral pedal pulses.   SKIN: No rash or jaundice.   NEUROLOGY: Alert and oriented x3. No focal deficit. Power five out  of five. Sensation intact. Deep tendon reflexes 2+. Babinski negative.   LABORATORY, DIAGNOSTIC, AND RADIOLOGICAL DATA: Urinalysis negative. Chest x-ray: Findings of chronic obstructive pulmonary disease, but no acute cardiopulmonary disease. CT scan of head unremarkable. WBC 5.5, hemoglobin 12.0, platelet 239. Glucose 95, BUN 26, creatinine 1.21, sodium 136, potassium 4.8, chloride 100, bicarbonate 25, troponin level less than 0.02. EKG shows sinus rhythm at 69 beats per minute.   IMPRESSION:  1. Syncope, possibly due to orthostatic hypotension and dehydration.  2. Possible carotid stenosis on the left side.  3. Hypertension, uncontrolled.  4. Hypothyroidism.  5. Hyperlipidemia.   PLAN OF TREATMENT:  1. The patient will be placed for observation. We will continue telemetry monitor and get a stress test due to the patient's chest discomfort this morning and syncope episode.  2. We will get a carotid duplex on the left side and check lipid panel, troponin level.  3. Continue aspirin, losartan and start Zocor.  4. For dehydration, we will give half normal saline and follow-up BMP.  5. Deep vein thrombosis prophylaxis. No PPI due to the patient's allergy to omeprazole and pantoprazole.   Discussed the patient's situation and plan of treatment with the patient and the patient's husband. They voiced understanding and agreed to the current treatment plan.         TIME SPENT: 60 minutes.   ____________________________ Demetrios Loll, MD qc:ap D: 12/24/2011 14:42:16 ET T: 12/24/2011 15:07:00 ET JOB#: [id number]  cc: Demetrios Loll, MD, <Dictator> Leonie Douglas. Doy Hutching, MD Demetrios Loll MD ELECTRONICALLY SIGNED 12/24/2011 20:21

## 2015-04-12 ENCOUNTER — Other Ambulatory Visit: Payer: Self-pay | Admitting: Physical Medicine and Rehabilitation

## 2015-04-12 DIAGNOSIS — M5416 Radiculopathy, lumbar region: Secondary | ICD-10-CM

## 2015-04-16 ENCOUNTER — Ambulatory Visit
Admission: RE | Admit: 2015-04-16 | Discharge: 2015-04-16 | Disposition: A | Discharge status: Home / Self Care | Payer: Medicare Other | Source: Ambulatory Visit | Attending: Physical Medicine and Rehabilitation | Admitting: Physical Medicine and Rehabilitation

## 2015-04-16 DIAGNOSIS — M47816 Spondylosis without myelopathy or radiculopathy, lumbar region: Secondary | ICD-10-CM | POA: Insufficient documentation

## 2015-04-16 DIAGNOSIS — M1288 Other specific arthropathies, not elsewhere classified, other specified site: Secondary | ICD-10-CM | POA: Diagnosis not present

## 2015-04-16 DIAGNOSIS — M4856XA Collapsed vertebra, not elsewhere classified, lumbar region, initial encounter for fracture: Secondary | ICD-10-CM | POA: Diagnosis not present

## 2015-04-16 DIAGNOSIS — M5416 Radiculopathy, lumbar region: Secondary | ICD-10-CM

## 2015-04-16 DIAGNOSIS — M545 Low back pain: Secondary | ICD-10-CM | POA: Diagnosis present

## 2015-06-08 ENCOUNTER — Emergency Department: Payer: Medicare Other

## 2015-06-08 ENCOUNTER — Encounter: Payer: Self-pay | Admitting: Urgent Care

## 2015-06-08 ENCOUNTER — Emergency Department
Admission: EM | Admit: 2015-06-08 | Discharge: 2015-06-09 | Disposition: A | Discharge status: Home / Self Care | Payer: Medicare Other | Attending: Emergency Medicine | Admitting: Emergency Medicine

## 2015-06-08 DIAGNOSIS — Z7982 Long term (current) use of aspirin: Secondary | ICD-10-CM | POA: Insufficient documentation

## 2015-06-08 DIAGNOSIS — R51 Headache: Secondary | ICD-10-CM | POA: Diagnosis not present

## 2015-06-08 DIAGNOSIS — I160 Hypertensive urgency: Secondary | ICD-10-CM

## 2015-06-08 DIAGNOSIS — Z79899 Other long term (current) drug therapy: Secondary | ICD-10-CM | POA: Insufficient documentation

## 2015-06-08 DIAGNOSIS — I10 Essential (primary) hypertension: Secondary | ICD-10-CM | POA: Diagnosis present

## 2015-06-08 HISTORY — DX: Disorder of thyroid, unspecified: E07.9

## 2015-06-08 HISTORY — DX: Essential (primary) hypertension: I10

## 2015-06-08 HISTORY — DX: Spinal stenosis, site unspecified: M48.00

## 2015-06-08 LAB — CBC
HCT: 32.1 % — ABNORMAL LOW (ref 35.0–47.0)
HEMOGLOBIN: 10.4 g/dL — AB (ref 12.0–16.0)
MCH: 28.1 pg (ref 26.0–34.0)
MCHC: 32.4 g/dL (ref 32.0–36.0)
MCV: 86.5 fL (ref 80.0–100.0)
Platelets: 245 10*3/uL (ref 150–440)
RBC: 3.71 MIL/uL — ABNORMAL LOW (ref 3.80–5.20)
RDW: 14.3 % (ref 11.5–14.5)
WBC: 7.2 10*3/uL (ref 3.6–11.0)

## 2015-06-08 LAB — TROPONIN I: Troponin I: <0.03 ng/mL (ref <0.031)

## 2015-06-08 LAB — BASIC METABOLIC PANEL
Anion gap: 9 (ref 5–15)
BUN: 19 mg/dL (ref 6–20)
CHLORIDE: 95 mmol/L — AB (ref 101–111)
CO2: 26 mmol/L (ref 22–32)
Calcium: 9.2 mg/dL (ref 8.9–10.3)
Creatinine, Ser: 0.98 mg/dL (ref 0.44–1.00)
GFR, EST AFRICAN AMERICAN: 60 mL/min — AB (ref >60)
GFR, EST NON AFRICAN AMERICAN: 52 mL/min — AB (ref >60)
GLUCOSE: 104 mg/dL — AB (ref 65–99)
POTASSIUM: 4.2 mmol/L (ref 3.5–5.1)
Sodium: 130 mmol/L — ABNORMAL LOW (ref 135–145)

## 2015-06-08 MED ORDER — LABETALOL HCL 5 MG/ML IV SOLN
10.0000 mg | Freq: Once | INTRAVENOUS | Status: AC
  Administered 2015-06-08: 10 mg via INTRAVENOUS
  Filled 2015-06-08: qty 4

## 2015-06-08 NOTE — ED Notes (Signed)
Pt presents to ED c/o headache pain and hypertension. She takes lisinopril in the morning and took another dose this evening at 1830 because her BP was still high. Pt also has some nausea, but denies vomiting and diarrhea. Pt alert & oriented with warm, dry skin. NAD noted.

## 2015-06-08 NOTE — Discharge Instructions (Signed)

## 2015-06-08 NOTE — ED Notes (Signed)
Patient presents with c/o a generalized headache and elevated BP that she cannot get to come down. Patient took an extra Lisinopril at 1830 without an reduction in her pressure per her report.

## 2015-06-08 NOTE — ED Provider Notes (Signed)
[facility] Emergency Department Provider Note  ____________________________________________  Time seen: 11:15 AM  I have reviewed the triage vital signs and the nursing notes.   HISTORY  Chief Complaint Headache and Hypertension      HPI Courtney Manning is a 81 y.o. female presents with generalize 7 out of 10 headache and nausea noted today. Patient markedly elevated BP presentation to the emergency department to 215/76. Patient denies any weakness no numbness no gait instability. Patient also denies any visual disturbance.   Past Medical History  Diagnosis Date  . Hypertension   . Spinal stenosis   . Thyroid disease     There are no active problems to display for this patient.   Past Surgical History  Procedure Laterality Date  . Spine surgery    . Hip surgery    . Pacemaker insertion      Current Outpatient Rx  Name  Route  Sig  Dispense  Refill  . aspirin EC 81 MG tablet   Oral   Take 1 tablet by mouth daily.         . Coenzyme Q10 100 MG capsule   Oral   Take 1 capsule by mouth daily.         . diphenhydrAMINE (SOMINEX) 25 MG tablet   Oral   Take 25 mg by mouth at bedtime as needed for sleep.         Marland Kitchen lisinopril (PRINIVIL,ZESTRIL) 5 MG tablet   Oral   Take 5 mg by mouth daily.         . Melatonin 3 MG TABS   Oral   Take 1 tablet by mouth daily as needed.         Marland Kitchen oxyCODONE (OXY IR/ROXICODONE) 5 MG immediate release tablet   Oral   Take 1 tablet by mouth 4 (four) times daily as needed.         . thyroid (ARMOUR) 120 MG tablet   Oral   Take 120 mg by mouth daily before breakfast.         . vitamin E 200 UNIT capsule   Oral   Take 1 capsule by mouth daily.           Allergies Brimonidine tartrate; Fluoxetine; Morphine; Omeprazole; Ciprofloxacin; and Gabapentin  No family history on file.  Social History History  Substance Use Topics  . Smoking status: Never Smoker   . Smokeless tobacco: Not on  file  . Alcohol Use: No    Review of Systems  Constitutional: Negative for fever. Eyes: Negative for visual changes. ENT: Negative for sore throat. Cardiovascular: Negative for chest pain. Respiratory: Negative for shortness of breath. Gastrointestinal: Negative for abdominal pain, vomiting and diarrhea. Genitourinary: Negative for dysuria. Musculoskeletal: Negative for back pain. Skin: Negative for rash. Neurological: Positive for headaches, negative for focal weakness or numbness.   10-point ROS otherwise negative.  ____________________________________________   PHYSICAL EXAM:  VITAL SIGNS: ED Triage Vitals  Enc Vitals Group     BP 06/08/15 2155 215/76 mmHg     Pulse Rate 06/08/15 2155 73     Resp 06/08/15 2155 16     Temp 06/08/15 2155 98.4 F (36.9 C)     Temp Source 06/08/15 2155 Oral     SpO2 06/08/15 2155 99 %     Weight 06/08/15 2155 115 lb (52.164 kg)     Height 06/08/15 2155 5\' 3"  (1.6 m)     Head Cir --  Peak Flow --      Pain Score 06/08/15 2155 8     Pain Loc --      Pain Edu? --      Excl. in [place]? --     Constitutional: Alert and oriented. Well appearing and in no distress. Eyes: Conjunctivae are normal. PERRL. Normal extraocular movements. ENT   Head: Normocephalic and atraumatic.   Nose: No congestion/rhinnorhea.   Mouth/Throat: Mucous membranes are moist.   Neck: No stridor. Cardiovascular: Normal rate, regular rhythm. Normal and symmetric distal pulses are present in all extremities. No murmurs, rubs, or gallops. Respiratory: Normal respiratory effort without tachypnea nor retractions. Breath sounds are clear and equal bilaterally. No wheezes/rales/rhonchi. Gastrointestinal: Soft and nontender. No distention. There is no CVA tenderness. Genitourinary: deferred Musculoskeletal: Nontender with normal range of motion in all extremities. No joint effusions.  No lower extremity tenderness nor edema. Neurologic:  Normal speech and  language. No gross focal neurologic deficits are appreciated. Speech is normal.  Skin:  Skin is warm, dry and intact. No rash noted. Psychiatric: Mood and affect are normal. Speech and behavior are normal. Patient exhibits appropriate insight and judgment.  ____________________________________________    LABS (pertinent positives/negatives)  Labs Reviewed  CBC - Abnormal; Notable for the following:    RBC 3.71 (*)    Hemoglobin 10.4 (*)    HCT 32.1 (*)    All other components within normal limits  BASIC METABOLIC PANEL - Abnormal; Notable for the following:    Sodium 130 (*)    Chloride 95 (*)    Glucose, Bld 104 (*)    GFR calc non Af Amer 52 (*)    GFR calc Af Amer 60 (*)    All other components within normal limits  TROPONIN I     ____________________________________________   EKG  ED ECG REPORT I, BROWN, [place] N, the attending physician, personally viewed and interpreted this ECG.   Date: 06/08/2015  EKG Time: 00:33  Rate: 72  Rhythm: Normal sinus rhythm  Axis: None  Intervals: Normal  ST&T Change: None   ____________________________________________    RADIOLOGY  CT scan of the head revealed:    CT Head Wo Contrast (Final result) Result time: 06/08/15 23:21:08   Final result by Rad Results In Interface (06/08/15 23:21:08)   Narrative:   CLINICAL DATA: Headache and hypertension  EXAM: CT HEAD WITHOUT CONTRAST  TECHNIQUE: Contiguous axial images were obtained from the base of the skull through the vertex without intravenous contrast.  COMPARISON: December 24, 2014  FINDINGS: Mild diffuse atrophy is stable. There is no intracranial mass, hemorrhage, extra-axial fluid collection, or midline shift. Patchy small vessel disease in the centra semiovale bilaterally is stable. There is no new gray-white compartment lesion. No acute infarct evident. The bony calvarium appears intact. The mastoid air cells are clear.  IMPRESSION: Stable  atrophy with patchy periventricular small vessel disease. No intracranial mass, hemorrhage, or acute appearing infarct.   Electronically Signed By: Lowella Grip III M.D. On: 06/08/2015 23:21       INITIAL IMPRESSION / ASSESSMENT AND PLAN / ED COURSE  Pertinent labs & imaging results that were available during my care of the patient were reviewed by me and considered in my medical decision making (see chart for details).  Patient received labetalol 20 mg IV and lisinopril 10 mg with improvement in blood pressure from 215/76 to 187/82 status post intervention. Patient advised to follow-up with Dr. Doy Hutching today for appropriate blood pressure control.  ____________________________________________   FINAL CLINICAL IMPRESSION(S) / ED DIAGNOSES  Final diagnoses:  Hypertensive urgency      Gregor Hams, MD 06/10/15 [phone]

## 2015-06-09 MED ORDER — IBUPROFEN 600 MG PO TABS
600.0000 mg | ORAL_TABLET | Freq: Once | ORAL | Status: AC
  Administered 2015-06-09: 600 mg via ORAL

## 2015-06-09 MED ORDER — IBUPROFEN 600 MG PO TABS
ORAL_TABLET | ORAL | Status: AC
  Administered 2015-06-09: 600 mg via ORAL
  Filled 2015-06-09: qty 1

## 2015-06-09 MED ORDER — LISINOPRIL 20 MG PO TABS
10.0000 mg | ORAL_TABLET | Freq: Once | ORAL | Status: AC
Start: 2015-06-09 — End: 2015-06-09
  Administered 2015-06-09: 10 mg via ORAL

## 2015-06-09 MED ORDER — LABETALOL HCL 5 MG/ML IV SOLN
10.0000 mg | Freq: Once | INTRAVENOUS | Status: AC
  Administered 2015-06-09: 10 mg via INTRAVENOUS

## 2015-06-09 MED ORDER — LISINOPRIL 20 MG PO TABS
ORAL_TABLET | ORAL | Status: AC
  Administered 2015-06-09: 10 mg via ORAL
  Filled 2015-06-09: qty 1

## 2015-06-15 ENCOUNTER — Other Ambulatory Visit: Payer: Self-pay | Admitting: Internal Medicine

## 2015-06-15 DIAGNOSIS — R1012 Left upper quadrant pain: Secondary | ICD-10-CM

## 2015-06-17 ENCOUNTER — Ambulatory Visit
Admission: RE | Admit: 2015-06-17 | Discharge: 2015-06-17 | Disposition: A | Discharge status: Home / Self Care | Payer: Medicare Other | Source: Ambulatory Visit | Attending: Orthopedic Surgery | Admitting: Orthopedic Surgery

## 2015-06-17 ENCOUNTER — Encounter: Payer: Self-pay | Admitting: *Deleted

## 2015-06-17 ENCOUNTER — Ambulatory Visit: Payer: Medicare Other | Admitting: Anesthesiology

## 2015-06-17 ENCOUNTER — Ambulatory Visit: Payer: Medicare Other

## 2015-06-17 ENCOUNTER — Encounter
Admission: RE | Disposition: A | Discharge status: Home / Self Care | Payer: Self-pay | Source: Ambulatory Visit | Attending: Orthopedic Surgery

## 2015-06-17 DIAGNOSIS — Z9889 Other specified postprocedural states: Secondary | ICD-10-CM | POA: Insufficient documentation

## 2015-06-17 DIAGNOSIS — Z9049 Acquired absence of other specified parts of digestive tract: Secondary | ICD-10-CM | POA: Diagnosis not present

## 2015-06-17 DIAGNOSIS — E039 Hypothyroidism, unspecified: Secondary | ICD-10-CM | POA: Diagnosis not present

## 2015-06-17 DIAGNOSIS — M545 Low back pain: Secondary | ICD-10-CM | POA: Diagnosis not present

## 2015-06-17 DIAGNOSIS — Z885 Allergy status to narcotic agent status: Secondary | ICD-10-CM | POA: Insufficient documentation

## 2015-06-17 DIAGNOSIS — Z888 Allergy status to other drugs, medicaments and biological substances status: Secondary | ICD-10-CM | POA: Diagnosis not present

## 2015-06-17 DIAGNOSIS — M4854XA Collapsed vertebra, not elsewhere classified, thoracic region, initial encounter for fracture: Secondary | ICD-10-CM | POA: Insufficient documentation

## 2015-06-17 DIAGNOSIS — Z79891 Long term (current) use of opiate analgesic: Secondary | ICD-10-CM | POA: Diagnosis not present

## 2015-06-17 DIAGNOSIS — Z883 Allergy status to other anti-infective agents status: Secondary | ICD-10-CM | POA: Diagnosis not present

## 2015-06-17 DIAGNOSIS — Z419 Encounter for procedure for purposes other than remedying health state, unspecified: Secondary | ICD-10-CM

## 2015-06-17 DIAGNOSIS — H409 Unspecified glaucoma: Secondary | ICD-10-CM | POA: Insufficient documentation

## 2015-06-17 DIAGNOSIS — F5104 Psychophysiologic insomnia: Secondary | ICD-10-CM | POA: Diagnosis not present

## 2015-06-17 DIAGNOSIS — Z833 Family history of diabetes mellitus: Secondary | ICD-10-CM | POA: Diagnosis not present

## 2015-06-17 DIAGNOSIS — Z8249 Family history of ischemic heart disease and other diseases of the circulatory system: Secondary | ICD-10-CM | POA: Diagnosis not present

## 2015-06-17 DIAGNOSIS — I10 Essential (primary) hypertension: Secondary | ICD-10-CM | POA: Insufficient documentation

## 2015-06-17 DIAGNOSIS — M810 Age-related osteoporosis without current pathological fracture: Secondary | ICD-10-CM | POA: Insufficient documentation

## 2015-06-17 DIAGNOSIS — Z8542 Personal history of malignant neoplasm of other parts of uterus: Secondary | ICD-10-CM | POA: Insufficient documentation

## 2015-06-17 DIAGNOSIS — Z79899 Other long term (current) drug therapy: Secondary | ICD-10-CM | POA: Diagnosis not present

## 2015-06-17 HISTORY — PX: KYPHOPLASTY: SHX5884

## 2015-06-17 SURGERY — KYPHOPLASTY
Anesthesia: Monitor Anesthesia Care

## 2015-06-17 MED ORDER — SODIUM CHLORIDE 0.9 % IV SOLN: INTRAVENOUS | Status: DC

## 2015-06-17 MED ORDER — ONDANSETRON HCL 4 MG PO TABS: 4.0000 mg | ORAL_TABLET | Freq: Four times a day (QID) | ORAL | Status: DC | PRN

## 2015-06-17 MED ORDER — LIDOCAINE HCL (PF) 1 % IJ SOLN
INTRAMUSCULAR | Status: AC
  Filled 2015-06-17: qty 60

## 2015-06-17 MED ORDER — METOCLOPRAMIDE HCL 10 MG PO TABS: 5.0000 mg | ORAL_TABLET | Freq: Three times a day (TID) | ORAL | Status: DC | PRN

## 2015-06-17 MED ORDER — METOCLOPRAMIDE HCL 5 MG/ML IJ SOLN: 5.0000 mg | Freq: Three times a day (TID) | INTRAMUSCULAR | Status: DC | PRN

## 2015-06-17 MED ORDER — FAMOTIDINE 20 MG PO TABS
20.0000 mg | ORAL_TABLET | Freq: Once | ORAL | Status: AC
  Administered 2015-06-17: 20 mg via ORAL

## 2015-06-17 MED ORDER — PROPOFOL INFUSION 10 MG/ML OPTIME
INTRAVENOUS | Status: DC | PRN
  Administered 2015-06-17: 50 ug/kg/min via INTRAVENOUS

## 2015-06-17 MED ORDER — FAMOTIDINE 20 MG PO TABS
ORAL_TABLET | ORAL | Status: AC
  Administered 2015-06-17: 20 mg via ORAL
  Filled 2015-06-17: qty 1

## 2015-06-17 MED ORDER — LACTATED RINGERS IV SOLN
INTRAVENOUS | Status: DC
  Administered 2015-06-17: 10:00:00 via INTRAVENOUS

## 2015-06-17 MED ORDER — CEFAZOLIN SODIUM-DEXTROSE 2-3 GM-% IV SOLR
INTRAVENOUS | Status: AC
  Filled 2015-06-17: qty 50

## 2015-06-17 MED ORDER — ONDANSETRON HCL 4 MG/2ML IJ SOLN: 4.0000 mg | Freq: Four times a day (QID) | INTRAMUSCULAR | Status: DC | PRN

## 2015-06-17 MED ORDER — LIDOCAINE HCL (CARDIAC) 20 MG/ML IV SOLN
INTRAVENOUS | Status: DC | PRN
  Administered 2015-06-17: 60 mg via INTRAVENOUS

## 2015-06-17 MED ORDER — KETAMINE HCL 50 MG/ML IJ SOLN
INTRAMUSCULAR | Status: DC | PRN
  Administered 2015-06-17: 20 mg via INTRAMUSCULAR

## 2015-06-17 MED ORDER — BUPIVACAINE-EPINEPHRINE (PF) 0.5% -1:200000 IJ SOLN
INTRAMUSCULAR | Status: AC
  Filled 2015-06-17: qty 30

## 2015-06-17 MED ORDER — ACETAMINOPHEN 10 MG/ML IV SOLN
INTRAVENOUS | Status: DC | PRN
  Administered 2015-06-17: 1000 mg via INTRAVENOUS

## 2015-06-17 MED ORDER — LABETALOL HCL 5 MG/ML IV SOLN
INTRAVENOUS | Status: DC | PRN
  Administered 2015-06-17: 10 mg via INTRAVENOUS

## 2015-06-17 MED ORDER — HYDROCODONE-ACETAMINOPHEN 5-325 MG PO TABS: 1.0000 | ORAL_TABLET | Freq: Four times a day (QID) | ORAL | Status: DC | PRN

## 2015-06-17 MED ORDER — PROPOFOL 10 MG/ML IV BOLUS
INTRAVENOUS | Status: DC | PRN
  Administered 2015-06-17 (×2): 10 mg via INTRAVENOUS
  Administered 2015-06-17: 30 mg via INTRAVENOUS

## 2015-06-17 MED ORDER — FENTANYL CITRATE (PF) 100 MCG/2ML IJ SOLN: 25.0000 ug | INTRAMUSCULAR | Status: DC | PRN

## 2015-06-17 MED ORDER — CEFAZOLIN SODIUM-DEXTROSE 2-3 GM-% IV SOLR
2.0000 g | Freq: Once | INTRAVENOUS | Status: AC
  Administered 2015-06-17: 2 g via INTRAVENOUS

## 2015-06-17 MED ORDER — LIDOCAINE HCL 1 % IJ SOLN
INTRAMUSCULAR | Status: DC | PRN
  Administered 2015-06-17: 30 mL

## 2015-06-17 MED ORDER — IOPAMIDOL (ISOVUE-300) INJECTION 61%
INTRAVENOUS | Status: DC | PRN
  Administered 2015-06-17: 50 mL via INTRAVENOUS

## 2015-06-17 MED ORDER — BUPIVACAINE-EPINEPHRINE (PF) 0.5% -1:200000 IJ SOLN
INTRAMUSCULAR | Status: DC | PRN
  Administered 2015-06-17: 20 mL via PERINEURAL

## 2015-06-17 MED ORDER — HYDROCODONE-ACETAMINOPHEN 5-325 MG PO TABS: 1.0000 | ORAL_TABLET | ORAL | Status: DC | PRN

## 2015-06-17 MED ORDER — ACETAMINOPHEN 10 MG/ML IV SOLN
INTRAVENOUS | Status: AC
  Filled 2015-06-17: qty 100

## 2015-06-17 MED ORDER — ONDANSETRON HCL 4 MG/2ML IJ SOLN: 4.0000 mg | Freq: Once | INTRAMUSCULAR | Status: DC | PRN

## 2015-06-17 SURGICAL SUPPLY — 16 items
CEMENT BONE KYPHON CDS (Cement) ×3 IMPLANT
DEVICE BIOPSY BONE KYPHX (INSTRUMENTS) ×3 IMPLANT
DRAPE C-ARM XRAY 36X54 (DRAPES) ×3 IMPLANT
DURAPREP 26ML APPLICATOR (WOUND CARE) ×3 IMPLANT
GLOVE SURG CUT RESIS 520032000 (GLOVE) ×3 IMPLANT
GLOVE SURG ORTHO 9.0 STRL STRW (GLOVE) ×3 IMPLANT
GOWN SPECIALTY ULTRA XL (MISCELLANEOUS) ×3 IMPLANT
GOWN STRL REUS W/ TWL LRG LVL3 (GOWN DISPOSABLE) ×1 IMPLANT
GOWN STRL REUS W/TWL LRG LVL3 (GOWN DISPOSABLE) ×2
LIQUID BAND (GAUZE/BANDAGES/DRESSINGS) ×3 IMPLANT
NDL SAFETY 18GX1.5 (NEEDLE) ×3 IMPLANT
PACK KYPHOPLASTY (MISCELLANEOUS) ×3 IMPLANT
STRAP SAFETY BODY (MISCELLANEOUS) ×3 IMPLANT
TRAY KYPHOPAK 15/2 EXPRESS (KITS) ×3 IMPLANT
TRAY KYPHOPAK 15/3 EXPRESS 1ST (MISCELLANEOUS) IMPLANT
TRAY KYPHOPAK 20/3 EXPRESS 1ST (MISCELLANEOUS) IMPLANT

## 2015-06-17 NOTE — Discharge Instructions (Signed)

## 2015-06-17 NOTE — Anesthesia Postprocedure Evaluation (Signed)
  Anesthesia Post-op Note  Patient: Courtney Manning  Procedure(s) Performed: Procedure(s): KYPHOPLASTY (N/A)  Anesthesia type:MAC  Patient location: PACU  Post pain: Pain level controlled  Post assessment: Post-op Vital signs reviewed, Patient's Cardiovascular Status Stable, Respiratory Function Stable, Patent Airway and No signs of Nausea or vomiting  Post vital signs: Reviewed and stable  Last Vitals:  Filed Vitals:   06/17/15 1213  BP: 172/69  Pulse: 58  Temp: 35.8 C  Resp: 12    Level of consciousness: awake, alert  and patient cooperative  Complications: No apparent anesthesia complications

## 2015-06-17 NOTE — Op Note (Signed)
06/17/2015  12:01 PM  PATIENT:  Courtney Manning  81 y.o. female  PRE-OPERATIVE DIAGNOSIS:  low back pain, T12  POST-OPERATIVE DIAGNOSIS:  low back pain  PROCEDURE:  Procedure(s): KYPHOPLASTY (N/A) T 12  SURGEON: Laurene Footman, MD  ASSISTANTS: none  ANESTHESIA:   MAC  EBL:  Total I/O In: 400 [I.V.:400] Out: -   BLOOD ADMINISTERED:none  DRAINS: none   LOCAL MEDICATIONS USED:  MARCAINE    and LIDOCAINE   SPECIMEN:  Source of Specimen:  T 12 biopsy  DISPOSITION OF SPECIMEN:  PATHOLOGY  COUNTS:  YES  TOURNIQUET:  * No tourniquets in log *  IMPLANTS: bone cement   DICTATION: .Dragon Dictation patient brought to the operating room and after adequate sedation was obtained she is placed prone. AP and lateral imaging of the affected vertebral body T12 was obtained. Timeout procedure completed and 10 cc of 1% Xylocaine infiltrated subcutaneously for initial local. The back was then prepped and draped in sterile fashion and repeat timeout procedure carried out. Spinal needle was then advanced onto the pedicle on both the left and right sides and a mixture of  half percent  Marcaine and lidocaine with epinephrine was infiltrated down to the bone. After allowing this to set small incisions were made trochars advanced in a transpedicular approach with biopsy obtained of the T12 vertebral body drilling carried out and then inflation of the balloons first the right side than the left with significant correction of her kyphotic deformity there is almost complete vertebral plana at the start of the case but most of the height was restored with the inflation. Cement was mixed and then inserted with approximate 5 cc filling the vertebral body without extravasation. After this was completed and the cemented set trochars were removed. Wounds closed with Dermabond and then a Band-Aid  PLAN OF CARE: Discharge to home after PACU  PATIENT DISPOSITION:  PACU - hemodynamically stable.

## 2015-06-17 NOTE — Anesthesia Preprocedure Evaluation (Signed)
Anesthesia Evaluation  Patient identified by MRN, date of birth, ID band Patient awake    Reviewed: Allergy & Precautions, NPO status , Patient's Chart, lab work & pertinent test results  Airway Mallampati: III  TM Distance: >3 FB Neck ROM: Limited    Dental  (+) Chipped, Caps   Pulmonary neg pulmonary ROS,  breath sounds clear to auscultation  Pulmonary exam normal       Cardiovascular hypertension, Normal cardiovascular exam+ pacemaker     Neuro/Psych negative neurological ROS  negative psych ROS   GI/Hepatic negative GI ROS, Neg liver ROS,   Endo/Other  Hypothyroidism   Renal/GU negative Renal ROS  negative genitourinary   Musculoskeletal negative musculoskeletal ROS (+) Arthritis -, Osteoarthritis,    Abdominal Normal abdominal exam  (+)   Peds negative pediatric ROS (+)  Hematology negative hematology ROS (+)   Anesthesia Other Findings   Reproductive/Obstetrics                             Anesthesia Physical Anesthesia Plan  ASA: III  Anesthesia Plan: MAC   Post-op Pain Management:    Induction: Intravenous  Airway Management Planned: Nasal Cannula  Additional Equipment:   Intra-op Plan:   Post-operative Plan:   Informed Consent: I have reviewed the patients History and Physical, chart, labs and discussed the procedure including the risks, benefits and alternatives for the proposed anesthesia with the patient or authorized representative who has indicated his/her understanding and acceptance.   Dental advisory given  Plan Discussed with: CRNA and Surgeon  Anesthesia Plan Comments:         Anesthesia Quick Evaluation

## 2015-06-17 NOTE — Transfer of Care (Signed)
Immediate Anesthesia Transfer of Care Note  Patient: Courtney Manning  Procedure(s) Performed: Procedure(s): KYPHOPLASTY (N/A)  Patient Location: PACU  Anesthesia Type:MAC  Level of Consciousness: awake, alert  and oriented  Airway & Oxygen Therapy: Patient Spontanous Breathing and Patient connected to nasal cannula oxygen  Post-op Assessment: Report given to RN and Post -op Vital signs reviewed and stable  Post vital signs: Reviewed and stable  Last Vitals:  Filed Vitals:   06/17/15 1213  BP: 172/69  Pulse: 58  Temp: 35.8 C  Resp: 12    Complications: No apparent anesthesia complications

## 2015-06-17 NOTE — H&P (Signed)
Reviewed paper H+P, will be scanned into chart. No changes noted.  

## 2015-06-18 LAB — SURGICAL PATHOLOGY

## 2015-06-21 ENCOUNTER — Ambulatory Visit: Payer: Medicare Other

## 2016-05-16 ENCOUNTER — Encounter: Payer: Self-pay | Admitting: Internal Medicine

## 2016-05-16 ENCOUNTER — Inpatient Hospital Stay: Payer: Medicare Other | Attending: Internal Medicine | Admitting: Internal Medicine

## 2016-05-16 ENCOUNTER — Inpatient Hospital Stay: Payer: Medicare Other

## 2016-05-16 VITALS — BP 156/64 | HR 85 | Temp 97.3°F | Resp 18 | Wt 112.4 lb

## 2016-05-16 DIAGNOSIS — E079 Disorder of thyroid, unspecified: Secondary | ICD-10-CM

## 2016-05-16 DIAGNOSIS — R5383 Other fatigue: Secondary | ICD-10-CM | POA: Diagnosis not present

## 2016-05-16 DIAGNOSIS — D649 Anemia, unspecified: Secondary | ICD-10-CM

## 2016-05-16 DIAGNOSIS — Z7982 Long term (current) use of aspirin: Secondary | ICD-10-CM | POA: Insufficient documentation

## 2016-05-16 DIAGNOSIS — R0609 Other forms of dyspnea: Secondary | ICD-10-CM | POA: Diagnosis not present

## 2016-05-16 DIAGNOSIS — Z79899 Other long term (current) drug therapy: Secondary | ICD-10-CM | POA: Insufficient documentation

## 2016-05-16 DIAGNOSIS — K219 Gastro-esophageal reflux disease without esophagitis: Secondary | ICD-10-CM | POA: Insufficient documentation

## 2016-05-16 DIAGNOSIS — Z8541 Personal history of malignant neoplasm of cervix uteri: Secondary | ICD-10-CM | POA: Diagnosis not present

## 2016-05-16 DIAGNOSIS — Z950 Presence of cardiac pacemaker: Secondary | ICD-10-CM | POA: Diagnosis not present

## 2016-05-16 DIAGNOSIS — I10 Essential (primary) hypertension: Secondary | ICD-10-CM | POA: Insufficient documentation

## 2016-05-16 DIAGNOSIS — D6489 Other specified anemias: Secondary | ICD-10-CM | POA: Insufficient documentation

## 2016-05-16 LAB — COMPREHENSIVE METABOLIC PANEL
ALBUMIN: 3.9 g/dL (ref 3.5–5.0)
ALK PHOS: 81 U/L (ref 38–126)
ALT: 20 U/L (ref 14–54)
ANION GAP: 4 — AB (ref 5–15)
AST: 21 U/L (ref 15–41)
BUN: 35 mg/dL — ABNORMAL HIGH (ref 6–20)
CHLORIDE: 102 mmol/L (ref 101–111)
CO2: 26 mmol/L (ref 22–32)
Calcium: 9 mg/dL (ref 8.9–10.3)
Creatinine, Ser: 1.34 mg/dL — ABNORMAL HIGH (ref 0.44–1.00)
GFR calc non Af Amer: 35 mL/min — ABNORMAL LOW (ref >60)
GFR, EST AFRICAN AMERICAN: 41 mL/min — AB (ref >60)
Glucose, Bld: 104 mg/dL — ABNORMAL HIGH (ref 65–99)
POTASSIUM: 4.9 mmol/L (ref 3.5–5.1)
SODIUM: 132 mmol/L — AB (ref 135–145)
TOTAL PROTEIN: 7 g/dL (ref 6.5–8.1)
Total Bilirubin: 0.5 mg/dL (ref 0.3–1.2)

## 2016-05-16 LAB — IRON AND TIBC
Iron: 13 ug/dL — ABNORMAL LOW (ref 28–170)
SATURATION RATIOS: 3 % — AB (ref 10.4–31.8)
TIBC: 440 ug/dL (ref 250–450)
UIBC: 427 ug/dL

## 2016-05-16 LAB — CBC WITH DIFFERENTIAL/PLATELET
BASOS ABS: 0.1 10*3/uL (ref 0–0.1)
Basophils Relative: 1 %
Eosinophils Absolute: 0.3 10*3/uL (ref 0–0.7)
HEMATOCRIT: 24.6 % — AB (ref 35.0–47.0)
HEMOGLOBIN: 8.1 g/dL — AB (ref 12.0–16.0)
LYMPHS ABS: 1.2 10*3/uL (ref 1.0–3.6)
MCH: 26.4 pg (ref 26.0–34.0)
MCHC: 32.8 g/dL (ref 32.0–36.0)
MCV: 80.5 fL (ref 80.0–100.0)
Monocytes Absolute: 0.5 10*3/uL (ref 0.2–0.9)
Monocytes Relative: 8 %
NEUTROS ABS: 4.6 10*3/uL (ref 1.4–6.5)
Neutrophils Relative %: 68 %
PLATELETS: 296 10*3/uL (ref 150–440)
RBC: 3.06 MIL/uL — AB (ref 3.80–5.20)
RDW: 15 % — ABNORMAL HIGH (ref 11.5–14.5)
WBC: 6.8 10*3/uL (ref 3.6–11.0)

## 2016-05-16 LAB — RETICULOCYTES
RBC.: 2.95 MIL/uL — AB (ref 3.80–5.20)
RETIC COUNT ABSOLUTE: 17.7 10*3/uL — AB (ref 19.0–183.0)
Retic Ct Pct: 0.6 % (ref 0.4–3.1)

## 2016-05-16 LAB — URINALYSIS COMPLETE WITH MICROSCOPIC (ARMC ONLY)
Bacteria, UA: NONE SEEN
Bilirubin Urine: NEGATIVE
GLUCOSE, UA: NEGATIVE mg/dL
HGB URINE DIPSTICK: NEGATIVE
KETONES UR: NEGATIVE mg/dL
Leukocytes, UA: NEGATIVE
NITRITE: NEGATIVE
Protein, ur: NEGATIVE mg/dL
RBC / HPF: NONE SEEN RBC/hpf (ref 0–5)
SPECIFIC GRAVITY, URINE: 1.01 (ref 1.005–1.030)
WBC UA: NONE SEEN WBC/hpf (ref 0–5)
pH: 5.5 (ref 5.0–8.0)

## 2016-05-16 LAB — LACTATE DEHYDROGENASE: LDH: 233 U/L — ABNORMAL HIGH (ref 98–192)

## 2016-05-16 LAB — FERRITIN: FERRITIN: 9 ng/mL — AB (ref 11–307)

## 2016-05-16 NOTE — Progress Notes (Signed)
Patient here today as new evaluation regarding anemia.  Referred by Dr. Doy Hutching.  Patient states she has had back surgery and now has back pain when she stands or walks.  She states she is fine when sitting.

## 2016-05-16 NOTE — Assessment & Plan Note (Signed)
Chronic anemia at least since February 2016. Unclear etiology. Repeat CBC CMP and LDH and studies ferritin and reticulocyte count. Also check urinalysis stool occult card. Check haptoglobin and monoclonal workup.  # Discussed the patient has been that further options- would depend upon the results of the above workup. They agree.  # She follow with me approximately 2 weeks/ based upon above workup.  Thank you Dr.Sparks  for allowing me to participate in the care of your pleasant patient. Please do not hesitate to contact me with questions or concerns in the interim.

## 2016-05-16 NOTE — Progress Notes (Signed)
[place] NOTE  Patient Care Team: Idelle Crouch, MD as PCP - General (Internal Medicine)  CHIEF COMPLAINTS/PURPOSE OF CONSULTATION:   # FEB 2016- ANEMIA-   HISTORY OF PRESENTING ILLNESS:  Courtney Manning 82 y.o.  female has been referred to [place] for further evaluation of anemia.  Review of patient's blood counts show- patient has been progressively been anemic since February 2016; normal white count and platelets. Patient admits to having colonoscopy many years ago.  Patient denies any blood in stools or black stools. She is not on iron pills. She has lost some weight over many years but not recently. Denies any abdominal pain or nausea vomiting. Denies any unusual constipation or diarrhea.  Complains of fatigue/shortness of breath on exertion. Denies any night sweats or fevers.   ROS: A complete 10 point review of system is done which is negative except mentioned above in history of present illness  MEDICAL HISTORY:  Past Medical History  Diagnosis Date  . Hypertension   . Spinal stenosis   . Thyroid disease   . Cervical cancer ([place])   . Insomnia   . GERD (gastroesophageal reflux disease)   . Constipation     SURGICAL HISTORY: Past Surgical History  Procedure Laterality Date  . Spine surgery    . Hip surgery    . Pacemaker insertion    . Kyphoplasty N/A 06/17/2015    Procedure: KYPHOPLASTY;  Surgeon: Hessie Knows, MD;  Location: [facility] ORS;  Service: Orthopedics;  Laterality: N/A;  . Abdominal hysterectomy    . Back surgery      SOCIAL HISTORY: No smoking or alcohol. Social History   Social History  . Marital Status: Married    Spouse Name: N/A  . Number of Children: N/A  . Years of Education: N/A   Occupational History  . Not on file.   Social History Main Topics  . Smoking status: Never Smoker   . Smokeless tobacco: Not on file  . Alcohol Use: No  . Drug Use: Not on file  . Sexual Activity: Not on file   Other Topics Concern  .  Not on file   Social History Narrative    FAMILY HISTORY:Denies any family history of cancer. No family history on file.  ALLERGIES:  is allergic to brimonidine tartrate; fluoxetine; morphine; omeprazole; ciprofloxacin; and gabapentin.  MEDICATIONS:  Current Outpatient Prescriptions  Medication Sig Dispense Refill  . aspirin EC 81 MG tablet Take 1 tablet by mouth daily.    . Cholecalciferol (VITAMIN D3) 100000 UNIT/GM POWD Take by mouth.    . Coenzyme Q10 100 MG capsule Take 1 capsule by mouth daily.    Marland Kitchen lisinopril (PRINIVIL,ZESTRIL) 5 MG tablet Take 5 mg by mouth daily.    . Melatonin 3 MG TABS Take 1 tablet by mouth daily as needed.    . thyroid (ARMOUR) 120 MG tablet Take 120 mg by mouth daily before breakfast.    . traZODone (DESYREL) 50 MG tablet Take by mouth.     No current facility-administered medications for this visit.     PHYSICAL EXAMINATION:   Filed Vitals:   05/16/16 1359  BP: 156/64  Pulse: 85  Temp: 97.3 F (36.3 C)  Resp: 18   Filed Weights   05/16/16 1359  Weight: 112 lb 7 oz (51 kg)    GENERAL: Well-nourished well-developed; Alert, no distress and comfortable.  Accompanied by her husband. She walks with a cane. EYES: no pallor or icterus OROPHARYNX: no  thrush or ulceration; good dentition  NECK: supple, no masses felt LYMPH:  no palpable lymphadenopathy in the cervical, axillary or inguinal regions LUNGS: clear to auscultation and  No wheeze or crackles HEART/CVS: regular rate & rhythm and no murmurs; No lower extremity edema ABDOMEN: abdomen soft, non-tender and normal bowel sounds Musculoskeletal:no cyanosis of digits and no clubbing  PSYCH: alert & oriented x 3 with fluent speech NEURO: no focal motor/sensory deficits SKIN:  no rashes or significant lesions  LABORATORY DATA:  I have reviewed the data as listed Lab Results  Component Value Date   WBC 6.8 05/16/2016   HGB 8.1* 05/16/2016   HCT 24.6* 05/16/2016   MCV 80.5 05/16/2016    PLT 296 05/16/2016    Recent Labs  06/08/15 2226 05/16/16 1447  NA 130* 132*  K 4.2 4.9  CL 95* 102  CO2 26 26  GLUCOSE 104* 104*  BUN 19 35*  CREATININE 0.98 1.34*  CALCIUM 9.2 9.0  GFRNONAA 52* 35*  GFRAA 60* 41*  PROT  --  7.0  ALBUMIN  --  3.9  AST  --  21  ALT  --  20  ALKPHOS  --  81  BILITOT  --  0.5    RADIOGRAPHIC STUDIES: I have personally reviewed the radiological images as listed and agreed with the findings in the report. No results found.  ASSESSMENT & PLAN:  Anemia due to other cause Chronic anemia at least since February 2016. Unclear etiology. Repeat CBC CMP and LDH and studies ferritin and reticulocyte count. Also check urinalysis stool occult card. Check haptoglobin and monoclonal workup.  # Discussed the patient has been that further options- would depend upon the results of the above workup. They agree.  # She follow with me approximately 2 weeks/ based upon above workup.  Thank you Dr.Sparks  for allowing me to participate in the care of your pleasant patient. Please do not hesitate to contact me with questions or concerns in the interim.   All questions were answered. The patient knows to call the clinic with any problems, questions or concerns.    [name], MD 05/16/2016 4:13 PM

## 2016-05-17 DIAGNOSIS — D649 Anemia, unspecified: Secondary | ICD-10-CM | POA: Diagnosis not present

## 2016-05-17 LAB — FOLATE: Folate: 51.3 ng/mL (ref >5.9)

## 2016-05-17 LAB — KAPPA/LAMBDA LIGHT CHAINS
KAPPA FREE LGHT CHN: 39.9 mg/L — AB (ref 3.3–19.4)
Kappa, lambda light chain ratio: 1.34 (ref 0.26–1.65)
LAMDA FREE LIGHT CHAINS: 29.7 mg/L — AB (ref 5.7–26.3)

## 2016-05-17 LAB — HAPTOGLOBIN: HAPTOGLOBIN: 132 mg/dL (ref 34–200)

## 2016-05-18 ENCOUNTER — Other Ambulatory Visit: Payer: Self-pay | Admitting: *Deleted

## 2016-05-18 ENCOUNTER — Telehealth: Payer: Self-pay | Admitting: *Deleted

## 2016-05-18 ENCOUNTER — Ambulatory Visit: Payer: Medicare Other

## 2016-05-18 DIAGNOSIS — D6489 Other specified anemias: Secondary | ICD-10-CM

## 2016-05-18 LAB — OCCULT BLOOD X 1 CARD TO LAB, STOOL
FECAL OCCULT BLD: NEGATIVE
FECAL OCCULT BLD: NEGATIVE

## 2016-05-18 LAB — MULTIPLE MYELOMA PANEL, SERUM
ALBUMIN/GLOB SERPL: 1.4 (ref 0.7–1.7)
ALPHA2 GLOB SERPL ELPH-MCNC: 0.7 g/dL (ref 0.4–1.0)
Albumin SerPl Elph-Mcnc: 3.9 g/dL (ref 2.9–4.4)
Alpha 1: 0.3 g/dL (ref 0.0–0.4)
B-Globulin SerPl Elph-Mcnc: 0.9 g/dL (ref 0.7–1.3)
Gamma Glob SerPl Elph-Mcnc: 1 g/dL (ref 0.4–1.8)
Globulin, Total: 2.8 g/dL (ref 2.2–3.9)
IGA: 104 mg/dL (ref 64–422)
IGG (IMMUNOGLOBIN G), SERUM: 948 mg/dL (ref 700–1600)
IGM, SERUM: 110 mg/dL (ref 26–217)
Total Protein ELP: 6.7 g/dL (ref 6.0–8.5)

## 2016-05-18 NOTE — Telephone Encounter (Signed)
-----   Message from [name], MD sent at 05/17/2016  4:11 PM EDT ----- Needs IV venofer weekly x4; also EGD/colo; refer to GI/or let dr. Doy Hutching office do the referal -Thx

## 2016-05-19 NOTE — Telephone Encounter (Signed)
Called patient to inform her that MD recommends IV venofer weekly for 4 weeks.  Also recommends she have a EGD/colonoscopy.  Dr Doy Hutching office has been contacted and they will be making that referral for patient.  Verbalized understanding.

## 2016-05-23 ENCOUNTER — Ambulatory Visit: Payer: Medicare Other

## 2016-05-23 ENCOUNTER — Other Ambulatory Visit: Payer: Self-pay | Admitting: Internal Medicine

## 2016-05-23 DIAGNOSIS — D500 Iron deficiency anemia secondary to blood loss (chronic): Secondary | ICD-10-CM | POA: Insufficient documentation

## 2016-05-23 DIAGNOSIS — D508 Other iron deficiency anemias: Secondary | ICD-10-CM

## 2016-05-24 ENCOUNTER — Inpatient Hospital Stay: Payer: Medicare Other

## 2016-05-24 VITALS — BP 138/65 | HR 88 | Temp 97.0°F | Resp 18

## 2016-05-24 DIAGNOSIS — D649 Anemia, unspecified: Secondary | ICD-10-CM | POA: Diagnosis not present

## 2016-05-24 DIAGNOSIS — D508 Other iron deficiency anemias: Secondary | ICD-10-CM

## 2016-05-24 MED ORDER — SODIUM CHLORIDE 0.9 % IV SOLN
Freq: Once | INTRAVENOUS | Status: AC
  Administered 2016-05-24: 11:00:00 via INTRAVENOUS
  Filled 2016-05-24: qty 1000

## 2016-05-24 MED ORDER — SODIUM CHLORIDE 0.9 % IV SOLN
200.0000 mg | Freq: Once | INTRAVENOUS | Status: AC
  Administered 2016-05-24: 200 mg via INTRAVENOUS
  Filled 2016-05-24: qty 10

## 2016-05-29 ENCOUNTER — Other Ambulatory Visit: Payer: Self-pay | Admitting: *Deleted

## 2016-05-29 DIAGNOSIS — D509 Iron deficiency anemia, unspecified: Secondary | ICD-10-CM

## 2016-05-30 ENCOUNTER — Ambulatory Visit: Payer: Medicare Other

## 2016-05-30 ENCOUNTER — Inpatient Hospital Stay: Payer: Medicare Other

## 2016-05-30 ENCOUNTER — Inpatient Hospital Stay: Payer: Medicare Other | Attending: Internal Medicine | Admitting: Internal Medicine

## 2016-05-30 VITALS — BP 168/73 | HR 79 | Temp 97.7°F | Resp 18

## 2016-05-30 VITALS — BP 147/54 | HR 78 | Temp 98.1°F | Resp 18 | Wt 112.7 lb

## 2016-05-30 DIAGNOSIS — E079 Disorder of thyroid, unspecified: Secondary | ICD-10-CM | POA: Diagnosis not present

## 2016-05-30 DIAGNOSIS — D508 Other iron deficiency anemias: Secondary | ICD-10-CM

## 2016-05-30 DIAGNOSIS — Z8541 Personal history of malignant neoplasm of cervix uteri: Secondary | ICD-10-CM | POA: Insufficient documentation

## 2016-05-30 DIAGNOSIS — D509 Iron deficiency anemia, unspecified: Secondary | ICD-10-CM

## 2016-05-30 DIAGNOSIS — Z79899 Other long term (current) drug therapy: Secondary | ICD-10-CM | POA: Diagnosis not present

## 2016-05-30 DIAGNOSIS — K219 Gastro-esophageal reflux disease without esophagitis: Secondary | ICD-10-CM | POA: Insufficient documentation

## 2016-05-30 DIAGNOSIS — R5383 Other fatigue: Secondary | ICD-10-CM | POA: Insufficient documentation

## 2016-05-30 DIAGNOSIS — I10 Essential (primary) hypertension: Secondary | ICD-10-CM | POA: Insufficient documentation

## 2016-05-30 DIAGNOSIS — Z950 Presence of cardiac pacemaker: Secondary | ICD-10-CM | POA: Insufficient documentation

## 2016-05-30 DIAGNOSIS — Z7982 Long term (current) use of aspirin: Secondary | ICD-10-CM | POA: Diagnosis not present

## 2016-05-30 DIAGNOSIS — R0609 Other forms of dyspnea: Secondary | ICD-10-CM | POA: Diagnosis not present

## 2016-05-30 MED ORDER — SODIUM CHLORIDE 0.9 % IV SOLN
200.0000 mg | Freq: Once | INTRAVENOUS | Status: AC
  Administered 2016-05-30: 200 mg via INTRAVENOUS
  Filled 2016-05-30: qty 10

## 2016-05-30 MED ORDER — SODIUM CHLORIDE 0.9 % IV SOLN
Freq: Once | INTRAVENOUS | Status: AC
  Administered 2016-05-30: 13:00:00 via INTRAVENOUS
  Filled 2016-05-30: qty 1000

## 2016-05-30 NOTE — Progress Notes (Signed)
Patient states she is having problems with reflux.  States she spits up her food.

## 2016-05-30 NOTE — Progress Notes (Signed)
[place] NOTE  Patient Care Team: Idelle Crouch, MD as PCP - General (Internal Medicine)  CHIEF COMPLAINTS/PURPOSE OF CONSULTATION:   # July 2017  IRON DEFICEINCY ANEMIA [since Feb 2016] IV vennofer; recm Gi eval [Dr.Elliot remote colo]  HISTORY OF PRESENTING ILLNESS:  Courtney Manning 82 y.o.  female is here to review the blood work ordered for anemia.   Patient has received 1 IV iron infusion last week. Has not noted any significant improvement in her energy levels.   Patient denies any blood in stools or black stools.  Denies any abdominal pain or nausea vomiting. Denies any unusual constipation or diarrhea.  Complains of fatigue/shortness of breath on exertion. Denies any night sweats or fevers.   ROS: A complete 10 point review of system is done which is negative except mentioned above in history of present illness  MEDICAL HISTORY:  Past Medical History:  Diagnosis Date  . Cervical cancer ([place])   . Constipation   . GERD (gastroesophageal reflux disease)   . Hypertension   . Insomnia   . Spinal stenosis   . Thyroid disease     SURGICAL HISTORY: Past Surgical History:  Procedure Laterality Date  . ABDOMINAL HYSTERECTOMY    . BACK SURGERY    . HIP SURGERY    . KYPHOPLASTY N/A 06/17/2015   Procedure: KYPHOPLASTY;  Surgeon: Hessie Knows, MD;  Location: [facility] ORS;  Service: Orthopedics;  Laterality: N/A;  . PACEMAKER INSERTION    . SPINE SURGERY      SOCIAL HISTORY: No smoking or alcohol. Social History   Social History  . Marital status: Married    Spouse name: N/A  . Number of children: N/A  . Years of education: N/A   Occupational History  . Not on file.   Social History Main Topics  . Smoking status: Never Smoker  . Smokeless tobacco: Not on file  . Alcohol use No  . Drug use: Unknown  . Sexual activity: Not on file   Other Topics Concern  . Not on file   Social History Narrative  . No narrative on file    FAMILY  HISTORY:Denies any family history of cancer. No family history on file.  ALLERGIES:  is allergic to brimonidine tartrate; fluoxetine; morphine; omeprazole; ciprofloxacin; and gabapentin.  MEDICATIONS:  Current Outpatient Prescriptions  Medication Sig Dispense Refill  . aspirin EC 81 MG tablet Take 1 tablet by mouth daily.    . Cholecalciferol (VITAMIN D3) 100000 UNIT/GM POWD Take by mouth.    . Coenzyme Q10 100 MG capsule Take 1 capsule by mouth daily.    Marland Kitchen lisinopril (PRINIVIL,ZESTRIL) 5 MG tablet Take 5 mg by mouth daily.    . Melatonin 3 MG TABS Take 1 tablet by mouth daily as needed.    . thyroid (ARMOUR) 120 MG tablet Take 120 mg by mouth daily before breakfast.    . traZODone (DESYREL) 50 MG tablet Take by mouth.     No current facility-administered medications for this visit.    Facility-Administered Medications Ordered in Other Visits  Medication Dose Route Frequency Provider Last Rate Last Dose  . iron sucrose (VENOFER) 200 mg in sodium chloride 0.9 % 100 mL IVPB  200 mg Intravenous Once [name], MD 146.7 mL/hr at 05/30/16 1255 200 mg at 05/30/16 1255     PHYSICAL EXAMINATION:   Vitals:   05/30/16 1130  BP: (!) 147/54  Pulse: 78  Resp: 18  Temp: 98.1 F (36.7  C)   Filed Weights   05/30/16 1130  Weight: 112 lb 10.5 oz (51.1 kg)    GENERAL: Well-nourished well-developed; Alert, no distress and comfortable.  Accompanied by her husband. She walks with a cane. EYES: no pallor or icterus OROPHARYNX: no thrush or ulceration; good dentition  NECK: supple, no masses felt LYMPH:  no palpable lymphadenopathy in the cervical, axillary or inguinal regions LUNGS: clear to auscultation and  No wheeze or crackles HEART/CVS: regular rate & rhythm and no murmurs; No lower extremity edema ABDOMEN: abdomen soft, non-tender and normal bowel sounds Musculoskeletal:no cyanosis of digits and no clubbing  PSYCH: alert & oriented x 3 with fluent speech NEURO: no focal  motor/sensory deficits SKIN:  no rashes or significant lesions  LABORATORY DATA:  I have reviewed the data as listed Lab Results  Component Value Date   WBC 6.8 05/16/2016   HGB 8.1 (L) 05/16/2016   HCT 24.6 (L) 05/16/2016   MCV 80.5 05/16/2016   PLT 296 05/16/2016    Recent Labs  06/08/15 2226 05/16/16 1447  NA 130* 132*  K 4.2 4.9  CL 95* 102  CO2 26 26  GLUCOSE 104* 104*  BUN 19 35*  CREATININE 0.98 1.34*  CALCIUM 9.2 9.0  GFRNONAA 52* 35*  GFRAA 60* 41*  PROT  --  7.0  ALBUMIN  --  3.9  AST  --  21  ALT  --  20  ALKPHOS  --  81  BILITOT  --  0.5    RADIOGRAPHIC STUDIES: I have personally reviewed the radiological images as listed and agreed with the findings in the report. No results found.  ASSESSMENT & PLAN:  Other iron deficiency anemias IDA- etiology is unclear. Recommend GI evaluation. Our office had contacted PCPs office. We will make a referral to Dr. Vira Agar.  # Continue with IV iron weekly Venofer. No issues with infusion so far.  # Recommend follow-up in 4 weeks CBC.   All questions were answered. The patient knows to call the clinic with any problems, questions or concerns.    [name], MD 05/30/2016 1:32 PM

## 2016-05-30 NOTE — Assessment & Plan Note (Signed)
IDA- etiology is unclear. Recommend GI evaluation. Our office had contacted PCPs office. We will make a referral to Dr. Vira Agar.  # Continue with IV iron weekly Venofer. No issues with infusion so far.  # Recommend follow-up in 4 weeks CBC.

## 2016-05-31 ENCOUNTER — Inpatient Hospital Stay: Payer: Medicare Other

## 2016-06-06 ENCOUNTER — Ambulatory Visit: Payer: Medicare Other

## 2016-06-07 ENCOUNTER — Inpatient Hospital Stay: Payer: Medicare Other

## 2016-06-07 VITALS — BP 170/79 | HR 75 | Temp 97.4°F | Resp 18

## 2016-06-07 DIAGNOSIS — D509 Iron deficiency anemia, unspecified: Secondary | ICD-10-CM | POA: Diagnosis not present

## 2016-06-07 DIAGNOSIS — D508 Other iron deficiency anemias: Secondary | ICD-10-CM

## 2016-06-07 MED ORDER — SODIUM CHLORIDE 0.9 % IV SOLN
200.0000 mg | Freq: Once | INTRAVENOUS | Status: AC
  Administered 2016-06-07: 200 mg via INTRAVENOUS
  Filled 2016-06-07: qty 10

## 2016-06-07 MED ORDER — SODIUM CHLORIDE 0.9 % IV SOLN
Freq: Once | INTRAVENOUS | Status: AC
  Administered 2016-06-07: 11:00:00 via INTRAVENOUS
  Filled 2016-06-07: qty 1000

## 2016-06-13 ENCOUNTER — Ambulatory Visit: Payer: Medicare Other

## 2016-06-14 ENCOUNTER — Inpatient Hospital Stay: Payer: Medicare Other

## 2016-06-14 VITALS — BP 149/67 | HR 74 | Temp 97.2°F | Resp 17

## 2016-06-14 DIAGNOSIS — D509 Iron deficiency anemia, unspecified: Secondary | ICD-10-CM | POA: Diagnosis not present

## 2016-06-14 DIAGNOSIS — D508 Other iron deficiency anemias: Secondary | ICD-10-CM

## 2016-06-14 MED ORDER — SODIUM CHLORIDE 0.9 % IV SOLN
Freq: Once | INTRAVENOUS | Status: AC
  Administered 2016-06-14: 10:00:00 via INTRAVENOUS
  Filled 2016-06-14: qty 1000

## 2016-06-14 MED ORDER — SODIUM CHLORIDE 0.9 % IV SOLN
200.0000 mg | Freq: Once | INTRAVENOUS | Status: AC
  Administered 2016-06-14: 200 mg via INTRAVENOUS
  Filled 2016-06-14: qty 10

## 2016-06-27 ENCOUNTER — Inpatient Hospital Stay (HOSPITAL_BASED_OUTPATIENT_CLINIC_OR_DEPARTMENT_OTHER): Payer: Medicare Other | Admitting: Internal Medicine

## 2016-06-27 ENCOUNTER — Inpatient Hospital Stay: Payer: Medicare Other

## 2016-06-27 VITALS — BP 145/65 | HR 77 | Temp 97.9°F | Resp 18 | Wt 112.2 lb

## 2016-06-27 DIAGNOSIS — I10 Essential (primary) hypertension: Secondary | ICD-10-CM | POA: Diagnosis not present

## 2016-06-27 DIAGNOSIS — E079 Disorder of thyroid, unspecified: Secondary | ICD-10-CM | POA: Diagnosis not present

## 2016-06-27 DIAGNOSIS — Z79899 Other long term (current) drug therapy: Secondary | ICD-10-CM

## 2016-06-27 DIAGNOSIS — D509 Iron deficiency anemia, unspecified: Secondary | ICD-10-CM | POA: Diagnosis not present

## 2016-06-27 DIAGNOSIS — R0609 Other forms of dyspnea: Secondary | ICD-10-CM

## 2016-06-27 DIAGNOSIS — D508 Other iron deficiency anemias: Secondary | ICD-10-CM

## 2016-06-27 DIAGNOSIS — Z8541 Personal history of malignant neoplasm of cervix uteri: Secondary | ICD-10-CM

## 2016-06-27 DIAGNOSIS — K219 Gastro-esophageal reflux disease without esophagitis: Secondary | ICD-10-CM

## 2016-06-27 DIAGNOSIS — Z7982 Long term (current) use of aspirin: Secondary | ICD-10-CM

## 2016-06-27 DIAGNOSIS — Z950 Presence of cardiac pacemaker: Secondary | ICD-10-CM

## 2016-06-27 LAB — COMPREHENSIVE METABOLIC PANEL
ALK PHOS: 86 U/L (ref 38–126)
ALT: 23 U/L (ref 14–54)
AST: 24 U/L (ref 15–41)
Albumin: 4.3 g/dL (ref 3.5–5.0)
Anion gap: 6 (ref 5–15)
BUN: 36 mg/dL — AB (ref 6–20)
CALCIUM: 9.2 mg/dL (ref 8.9–10.3)
CHLORIDE: 104 mmol/L (ref 101–111)
CO2: 26 mmol/L (ref 22–32)
CREATININE: 1.52 mg/dL — AB (ref 0.44–1.00)
GFR calc non Af Amer: 30 mL/min — ABNORMAL LOW (ref >60)
GFR, EST AFRICAN AMERICAN: 35 mL/min — AB (ref >60)
GLUCOSE: 98 mg/dL (ref 65–99)
Potassium: 5 mmol/L (ref 3.5–5.1)
Sodium: 136 mmol/L (ref 135–145)
Total Bilirubin: 0.4 mg/dL (ref 0.3–1.2)
Total Protein: 7.2 g/dL (ref 6.5–8.1)

## 2016-06-27 LAB — RETICULOCYTES
RBC.: 3.39 MIL/uL — ABNORMAL LOW (ref 3.80–5.20)
Retic Count, Absolute: 30.5 10*3/uL (ref 19.0–183.0)
Retic Ct Pct: 0.9 % (ref 0.4–3.1)

## 2016-06-27 LAB — CBC WITH DIFFERENTIAL/PLATELET
Basophils Absolute: 0 10*3/uL (ref 0–0.1)
Basophils Relative: 1 %
Eosinophils Absolute: 0.5 10*3/uL (ref 0–0.7)
Eosinophils Relative: 8 %
HEMATOCRIT: 30.1 % — AB (ref 35.0–47.0)
HEMOGLOBIN: 9.9 g/dL — AB (ref 12.0–16.0)
LYMPHS ABS: 1 10*3/uL (ref 1.0–3.6)
LYMPHS PCT: 15 %
MCH: 28.3 pg (ref 26.0–34.0)
MCHC: 32.8 g/dL (ref 32.0–36.0)
MCV: 86.5 fL (ref 80.0–100.0)
MONOS PCT: 7 %
Monocytes Absolute: 0.5 10*3/uL (ref 0.2–0.9)
NEUTROS PCT: 69 %
Neutro Abs: 4.8 10*3/uL (ref 1.4–6.5)
Platelets: 254 10*3/uL (ref 150–440)
RBC: 3.48 MIL/uL — AB (ref 3.80–5.20)
RDW: 18.6 % — ABNORMAL HIGH (ref 11.5–14.5)
WBC: 6.9 10*3/uL (ref 3.6–11.0)

## 2016-06-27 LAB — LACTATE DEHYDROGENASE: LDH: 200 U/L — ABNORMAL HIGH (ref 98–192)

## 2016-06-27 LAB — FERRITIN: FERRITIN: 160 ng/mL (ref 11–307)

## 2016-06-27 MED ORDER — SODIUM CHLORIDE 0.9 % IV SOLN
Freq: Once | INTRAVENOUS | Status: AC
  Administered 2016-06-27: 11:00:00 via INTRAVENOUS
  Filled 2016-06-27: qty 1000

## 2016-06-27 MED ORDER — SODIUM CHLORIDE 0.9 % IV SOLN
200.0000 mg | Freq: Once | INTRAVENOUS | Status: AC
  Administered 2016-06-27: 200 mg via INTRAVENOUS
  Filled 2016-06-27: qty 10

## 2016-06-27 NOTE — Progress Notes (Signed)
Patient states when she is sitting she is not having pain.  When she is standing or walking she has pain in back and legs.  Appetite poor. Also states Dr. Doy Manning gave her Percocet to help her sleep.

## 2016-06-27 NOTE — Progress Notes (Signed)
[place] NOTE  Patient Care Team: Courtney Crouch, MD as PCP - General (Internal Medicine)  CHIEF COMPLAINTS/PURPOSE OF CONSULTATION:   # July 2017  IRON DEFICEINCY ANEMIA [since Feb 2016] IV vennofer; recm Gi eval [Dr.Elliot remote colo]  HISTORY OF PRESENTING ILLNESS:  Courtney Manning 82 y.o.  female is here post IV iron for iron deficiency anemia. Patient had a GI office visit/awaiting endoscopies in November 2017.  Patient currently status post IV Venofer 4; last infusion 2 weeks ago. Patient denies any blood in stools or black stools.  Denies any abdominal pain or nausea vomiting. Denies any unusual constipation or diarrhea.  Notes to  have some improvement of shortness of breath. Denies any night sweats or fevers.   ROS: A complete 10 point review of system is done which is negative except mentioned above in history of present illness  MEDICAL HISTORY:  Past Medical History:  Diagnosis Date  . Cervical cancer ([place])   . Constipation   . GERD (gastroesophageal reflux disease)   . Hypertension   . Insomnia   . Spinal stenosis   . Thyroid disease     SURGICAL HISTORY: Past Surgical History:  Procedure Laterality Date  . ABDOMINAL HYSTERECTOMY    . BACK SURGERY    . HIP SURGERY    . KYPHOPLASTY N/A 06/17/2015   Procedure: KYPHOPLASTY;  Surgeon: Courtney Knows, MD;  Location: [facility] ORS;  Service: Orthopedics;  Laterality: N/A;  . PACEMAKER INSERTION    . SPINE SURGERY      SOCIAL HISTORY: No smoking or alcohol. Social History   Social History  . Marital status: Married    Spouse name: N/A  . Number of children: N/A  . Years of education: N/A   Occupational History  . Not on file.   Social History Main Topics  . Smoking status: Never Smoker  . Smokeless tobacco: Not on file  . Alcohol use No  . Drug use: Unknown  . Sexual activity: Not on file   Other Topics Concern  . Not on file   Social History Narrative  . No narrative on file     FAMILY HISTORY:Denies any family history of cancer. No family history on file.  ALLERGIES:  is allergic to brimonidine tartrate; fluoxetine; morphine; omeprazole; ciprofloxacin; and gabapentin.  MEDICATIONS:  Current Outpatient Prescriptions  Medication Sig Dispense Refill  . aspirin EC 81 MG tablet Take 1 tablet by mouth daily.    . Cholecalciferol (VITAMIN D3) 100000 UNIT/GM POWD Take by mouth.    . Coenzyme Q10 100 MG capsule Take 1 capsule by mouth daily.    Marland Kitchen lisinopril (PRINIVIL,ZESTRIL) 5 MG tablet Take 5 mg by mouth daily.    . Melatonin 3 MG TABS Take 1 tablet by mouth daily as needed.    . thyroid (ARMOUR) 120 MG tablet Take 120 mg by mouth daily before breakfast.    . oxyCODONE-acetaminophen (PERCOCET/ROXICET) 5-325 MG tablet      No current facility-administered medications for this visit.      PHYSICAL EXAMINATION:   Vitals:   06/27/16 0943  BP: (!) 145/65  Pulse: 77  Resp: 18  Temp: 97.9 F (36.6 C)   Filed Weights   06/27/16 0943  Weight: 112 lb 3.4 oz (50.9 kg)    GENERAL: Well-nourished well-developed; Alert, no distress and comfortable.  Accompanied by her husband. She walks with a cane. EYES: no pallor or icterus OROPHARYNX: no thrush or ulceration; good dentition  NECK:  supple, no masses felt LYMPH:  no palpable lymphadenopathy in the cervical, axillary or inguinal regions LUNGS: clear to auscultation and  No wheeze or crackles HEART/CVS: regular rate & rhythm and no murmurs; No lower extremity edema ABDOMEN: abdomen soft, non-tender and normal bowel sounds Musculoskeletal:no cyanosis of digits and no clubbing  PSYCH: alert & oriented x 3 with fluent speech NEURO: no focal motor/sensory deficits SKIN:  no rashes or significant lesions  LABORATORY DATA:  I have reviewed the data as listed Lab Results  Component Value Date   WBC 6.9 06/27/2016   HGB 9.9 (L) 06/27/2016   HCT 30.1 (L) 06/27/2016   MCV 86.5 06/27/2016   PLT 254 06/27/2016     Recent Labs  05/16/16 1447 06/27/16 0914  NA 132* 136  K 4.9 5.0  CL 102 104  CO2 26 26  GLUCOSE 104* 98  BUN 35* 36*  CREATININE 1.34* 1.52*  CALCIUM 9.0 9.2  GFRNONAA 35* 30*  GFRAA 41* 35*  PROT 7.0 7.2  ALBUMIN 3.9 4.3  AST 21 24  ALT 20 23  ALKPHOS 81 86  BILITOT 0.5 0.4    RADIOGRAPHIC STUDIES: I have personally reviewed the radiological images as listed and agreed with the findings in the report. No results found.  ASSESSMENT & PLAN:  Other iron deficiency anemias IDA- etiology is unclear. Me with GI- awaiting endoscopies with Dr.Elliot. Patient's hemoglobin today is 9.9 improved from 8.1 status post IV iron 4. I would recommend- 4 more IV Venofer.   # Follow-up with me in approximately 3 months/ repeat CBC iron studies Venofer.if needed.   All questions were answered. The patient Manning to call the clinic with any problems, questions or concerns.    Courtney Sickle, MD 06/27/2016 1:05 PM

## 2016-06-27 NOTE — Assessment & Plan Note (Addendum)
IDA- etiology is unclear. Me with GI- awaiting endoscopies with Dr.Elliot. Patient's hemoglobin today is 9.9 improved from 8.1 status post IV iron 4. I would recommend- 4 more IV Venofer.   # Follow-up with me in approximately 3 months/ repeat CBC iron studies Venofer.if needed.

## 2016-09-06 ENCOUNTER — Encounter: Payer: Self-pay | Admitting: *Deleted

## 2016-09-07 ENCOUNTER — Ambulatory Visit
Admission: RE | Admit: 2016-09-07 | Discharge: 2016-09-07 | Disposition: A | Discharge status: Home / Self Care | Payer: Medicare Other | Source: Ambulatory Visit | Attending: Gastroenterology | Admitting: Gastroenterology

## 2016-09-07 ENCOUNTER — Ambulatory Visit: Payer: Medicare Other | Admitting: Anesthesiology

## 2016-09-07 ENCOUNTER — Encounter
Admission: RE | Disposition: A | Discharge status: Home / Self Care | Payer: Self-pay | Source: Ambulatory Visit | Attending: Gastroenterology

## 2016-09-07 DIAGNOSIS — E039 Hypothyroidism, unspecified: Secondary | ICD-10-CM | POA: Diagnosis not present

## 2016-09-07 DIAGNOSIS — K2940 Chronic atrophic gastritis without bleeding: Secondary | ICD-10-CM | POA: Diagnosis not present

## 2016-09-07 DIAGNOSIS — D509 Iron deficiency anemia, unspecified: Secondary | ICD-10-CM | POA: Insufficient documentation

## 2016-09-07 DIAGNOSIS — K219 Gastro-esophageal reflux disease without esophagitis: Secondary | ICD-10-CM | POA: Diagnosis not present

## 2016-09-07 DIAGNOSIS — K5730 Diverticulosis of large intestine without perforation or abscess without bleeding: Secondary | ICD-10-CM | POA: Diagnosis not present

## 2016-09-07 DIAGNOSIS — Z886 Allergy status to analgesic agent status: Secondary | ICD-10-CM | POA: Insufficient documentation

## 2016-09-07 DIAGNOSIS — K228 Other specified diseases of esophagus: Secondary | ICD-10-CM | POA: Diagnosis not present

## 2016-09-07 DIAGNOSIS — I495 Sick sinus syndrome: Secondary | ICD-10-CM | POA: Diagnosis not present

## 2016-09-07 DIAGNOSIS — Q438 Other specified congenital malformations of intestine: Secondary | ICD-10-CM | POA: Diagnosis not present

## 2016-09-07 DIAGNOSIS — Z888 Allergy status to other drugs, medicaments and biological substances status: Secondary | ICD-10-CM | POA: Insufficient documentation

## 2016-09-07 DIAGNOSIS — Z881 Allergy status to other antibiotic agents status: Secondary | ICD-10-CM | POA: Diagnosis not present

## 2016-09-07 DIAGNOSIS — Z885 Allergy status to narcotic agent status: Secondary | ICD-10-CM | POA: Insufficient documentation

## 2016-09-07 DIAGNOSIS — I10 Essential (primary) hypertension: Secondary | ICD-10-CM | POA: Diagnosis not present

## 2016-09-07 DIAGNOSIS — Z950 Presence of cardiac pacemaker: Secondary | ICD-10-CM | POA: Insufficient documentation

## 2016-09-07 DIAGNOSIS — K449 Diaphragmatic hernia without obstruction or gangrene: Secondary | ICD-10-CM | POA: Diagnosis not present

## 2016-09-07 DIAGNOSIS — K5520 Angiodysplasia of colon without hemorrhage: Secondary | ICD-10-CM | POA: Insufficient documentation

## 2016-09-07 DIAGNOSIS — Z7982 Long term (current) use of aspirin: Secondary | ICD-10-CM | POA: Insufficient documentation

## 2016-09-07 DIAGNOSIS — Z79899 Other long term (current) drug therapy: Secondary | ICD-10-CM | POA: Diagnosis not present

## 2016-09-07 DIAGNOSIS — G4700 Insomnia, unspecified: Secondary | ICD-10-CM | POA: Insufficient documentation

## 2016-09-07 HISTORY — DX: Hypothyroidism, unspecified: E03.9

## 2016-09-07 HISTORY — DX: Anemia, unspecified: D64.9

## 2016-09-07 HISTORY — PX: COLONOSCOPY WITH PROPOFOL: SHX5780

## 2016-09-07 HISTORY — PX: ESOPHAGOGASTRODUODENOSCOPY (EGD) WITH PROPOFOL: SHX5813

## 2016-09-07 HISTORY — DX: Unspecified glaucoma: H40.9

## 2016-09-07 SURGERY — ESOPHAGOGASTRODUODENOSCOPY (EGD) WITH PROPOFOL
Anesthesia: General

## 2016-09-07 MED ORDER — GLYCOPYRROLATE 0.2 MG/ML IJ SOLN
INTRAMUSCULAR | Status: DC | PRN
  Administered 2016-09-07 (×2): 0.1 mg via INTRAVENOUS

## 2016-09-07 MED ORDER — PROPOFOL 10 MG/ML IV BOLUS
INTRAVENOUS | Status: DC | PRN
  Administered 2016-09-07: 80 mg via INTRAVENOUS

## 2016-09-07 MED ORDER — SODIUM CHLORIDE 0.9 % IV SOLN
INTRAVENOUS | Status: DC
  Administered 2016-09-07: 09:00:00 via INTRAVENOUS

## 2016-09-07 MED ORDER — LIDOCAINE 2% (20 MG/ML) 5 ML SYRINGE
INTRAMUSCULAR | Status: DC | PRN
  Administered 2016-09-07: 30 mg via INTRAVENOUS

## 2016-09-07 MED ORDER — SODIUM CHLORIDE 0.9 % IV SOLN: INTRAVENOUS | Status: DC

## 2016-09-07 MED ORDER — FENTANYL CITRATE (PF) 100 MCG/2ML IJ SOLN
INTRAMUSCULAR | Status: DC | PRN
  Administered 2016-09-07: 20 ug via INTRAVENOUS
  Administered 2016-09-07: 30 ug via INTRAVENOUS

## 2016-09-07 MED ORDER — MIDAZOLAM HCL 5 MG/5ML IJ SOLN
INTRAMUSCULAR | Status: DC | PRN
  Administered 2016-09-07 (×2): 0.5 mg via INTRAVENOUS

## 2016-09-07 MED ORDER — PROPOFOL 500 MG/50ML IV EMUL
INTRAVENOUS | Status: DC | PRN
  Administered 2016-09-07: 120 ug/kg/min via INTRAVENOUS

## 2016-09-07 NOTE — Anesthesia Postprocedure Evaluation (Signed)
Anesthesia Post Note  Patient: Courtney Manning  Procedure(s) Performed: Procedure(s) (LRB): ESOPHAGOGASTRODUODENOSCOPY (EGD) WITH PROPOFOL (N/A) COLONOSCOPY WITH PROPOFOL (N/A)  Patient location during evaluation: PACU Anesthesia Type: General Level of consciousness: awake Pain management: pain level controlled Vital Signs Assessment: post-procedure vital signs reviewed and stable Respiratory status: nonlabored ventilation Cardiovascular status: stable Anesthetic complications: no    Last Vitals:  Vitals:   09/07/16 1140 09/07/16 1159  BP: (!) 160/63 (!) 175/73  Pulse:  87  Resp: 18 13  Temp:      Last Pain:  Vitals:   09/07/16 1120  TempSrc: Tympanic  PainSc:                  [name],[name]

## 2016-09-07 NOTE — Transfer of Care (Signed)
Immediate Anesthesia Transfer of Care Note  Patient: Courtney Manning  Procedure(s) Performed: Procedure(s): ESOPHAGOGASTRODUODENOSCOPY (EGD) WITH PROPOFOL (N/A) COLONOSCOPY WITH PROPOFOL (N/A)  Patient Location: PACU and Endoscopy Unit  Anesthesia Type:General  Level of Consciousness: sedated  Airway & Oxygen Therapy: Patient Spontanous Breathing and Patient connected to nasal cannula oxygen  Post-op Assessment: Report given to RN and Post -op Vital signs reviewed and stable  Post vital signs: Reviewed and stable  Last Vitals:  Vitals:   09/07/16 0919  BP: (!) 196/70  Pulse: 89  Resp: 17  Temp: 36.4 C    Last Pain:  Vitals:   09/07/16 0919  TempSrc: Tympanic  PainSc: 7       Patients Stated Pain Goal: 3 (83/77/93 [id number])  Complications: No apparent anesthesia complications

## 2016-09-07 NOTE — H&P (Signed)
Outpatient short stay form Pre-procedure 09/07/2016 9:59 AM Lollie Sails MD  Primary Physician: Dr. Fulton Reek  Reason for visit:  EGD and colonoscopy  History of present illness:  Patient is a 82 year old female presenting today as above. She has been sent from Dr Rogue Bussing for finding of iron deficiency anemia. She does have chronic heartburn symptoms but states of PPI did not work for very well. She does take Prevacid daily. She has no difficulty swallowing. She denies any abdominal symptoms otherwise. She has a remote history of colon polyps in 2006. She does have some problems with constipation that is chronic for which she takes occasional MiraLAX.    Current Facility-Administered Medications:  .  0.9 %  sodium chloride infusion, , Intravenous, Continuous, Lollie Sails, MD, Last Rate: 20 mL/hr at 09/07/16 0927 .  0.9 %  sodium chloride infusion, , Intravenous, Continuous, Lollie Sails, MD  Prescriptions Prior to Admission  Medication Sig Dispense Refill Last Dose  . ibuprofen (ADVIL,MOTRIN) 600 MG tablet Take 600 mg by mouth every 6 (six) hours as needed.     . lansoprazole (PREVACID) 30 MG capsule Take 30 mg by mouth daily at 12 noon.     Marland Kitchen lisinopril (PRINIVIL,ZESTRIL) 5 MG tablet Take 5 mg by mouth daily.   09/07/2016 at 0630  . traZODone (DESYREL) 50 MG tablet Take 50 mg by mouth at bedtime.     . triamcinolone cream (KENALOG) 0.1 % Apply 1 application topically as needed.     . vitamin E 200 UNIT capsule Take 200 Units by mouth daily.     Marland Kitchen aspirin EC 81 MG tablet Take 1 tablet by mouth daily.   Taking  . Cholecalciferol (VITAMIN D3) 100000 UNIT/GM POWD Take by mouth.   Taking  . Coenzyme Q10 100 MG capsule Take 1 capsule by mouth daily.   Taking  . Melatonin 3 MG TABS Take 1 tablet by mouth daily as needed.   Taking  . oxyCODONE-acetaminophen (PERCOCET/ROXICET) 5-325 MG tablet      . thyroid (ARMOUR) 120 MG tablet Take 120 mg by mouth daily before breakfast.    Taking     Allergies  Allergen Reactions  . Codeine Anaphylaxis    Not Specified  . Brimonidine Tartrate Itching  . Fluoxetine Other (See Comments)    Reaction: Unknown  . Lodine [Etodolac]   . Morphine Itching  . Omeprazole Nausea And Vomiting  . Ciprofloxacin Rash  . Gabapentin Rash     Past Medical History:  Diagnosis Date  . Anemia   . Cervical cancer ([place])    Uterine Cancer  . Constipation   . GERD (gastroesophageal reflux disease)   . Glaucoma   . Hypertension   . Hypothyroidism   . Insomnia   . Spinal stenosis   . Thyroid disease   Patient also has a history of a permanent pacemaker placed 2 years ago. This was done for history of pauses. She has done well since that time.  Review of systems:      Physical Exam    Heart and lungs: Regular rate and rhythm without rub or gallop, lungs are bilaterally clear.    HEENT: Normocephalic atraumatic eyes are anicteric    Other:     Pertinant exam for procedure: Soft nontender nondistended bowel sounds positive normoactive.    Planned proceedures: EGD, colonoscopy and indicated procedures. I have discussed the risks benefits and complications of procedures to include not limited to bleeding, infection, perforation and the  risk of sedation and the patient wishes to proceed.    Lollie Sails, MD Gastroenterology 09/07/2016  9:59 AM

## 2016-09-07 NOTE — Op Note (Signed)
[facility] Gastroenterology Patient Name: Courtney Manning Procedure Date: 09/07/2016 10:04 AM MRN: [id number] Account #: [id number] Date of Birth: 1929-07-24 Admit Type: Outpatient Age: 82 Room: [facility] ENDO ROOM 1 Gender: Female Note Status: Finalized Procedure:            Upper GI endoscopy Indications:          Iron deficiency anemia Providers:            Lollie Sails, MD Referring MD:         Leonie Douglas. Doy Hutching, MD (Referring MD) Medicines:            Monitored Anesthesia Care Complications:        No immediate complications. Procedure:            Pre-Anesthesia Assessment:                       - ASA Grade Assessment: III - A patient with severe                        systemic disease.                       After obtaining informed consent, the endoscope was                        passed under direct vision. Throughout the procedure,                        the patient's blood pressure, pulse, and oxygen                        saturations were monitored continuously. The Endoscope                        was introduced through the mouth, and advanced to the                        third part of duodenum. The upper GI endoscopy was                        accomplished without difficulty. The patient tolerated                        the procedure. Findings:      A small hiatal hernia was found. The Z-line was a variable distance from       incisors; the hiatal hernia was sliding. No evidence of      Cameron lesions.      The Z-line was variable. Biopsies were taken with a cold forceps for       histology.      The exam of the esophagus was otherwise normal.      Patchy mild inflammation characterized by congestion (edema) and       erythema was found in the gastric body. Biopsies were taken with a cold       forceps for histology.      The cardia and gastric fundus were normal on retroflexion otherwise.      The examined duodenum was normal. I was unable to pass  beyond the 2nd to       3rd region due to sharp angulation of the c  loop. Impression:           - Small hiatal hernia.                       - Z-line variable. Biopsied.                       - Atrophic gastritis. Biopsied.                       - Normal examined duodenum. Recommendation:       - Continue present medications.                       - Perform a colonoscopy today. Procedure Code(s):    --- Professional ---                       [phone], Esophagogastroduodenoscopy, flexible, transoral;                        with biopsy, single or multiple Diagnosis Code(s):    --- Professional ---                       K44.9, Diaphragmatic hernia without obstruction or                        gangrene                       K22.8, Other specified diseases of esophagus                       K29.40, Chronic atrophic gastritis without bleeding                       D50.9, Iron deficiency anemia, unspecified CPT copyright 2016 American Medical Association. All rights reserved. The codes documented in this report are preliminary and upon coder review may  be revised to meet current compliance requirements. Lollie Sails, MD 09/07/2016 10:34:19 AM This report has been signed electronically. Number of Addenda: 0 Note Initiated On: 09/07/2016 10:04 AM      [facility]

## 2016-09-07 NOTE — Anesthesia Preprocedure Evaluation (Signed)
Anesthesia Evaluation  Patient identified by MRN, date of birth, ID band Patient awake    Reviewed: Allergy & Precautions, NPO status , Patient's Chart, lab work & pertinent test results  Airway Mallampati: II       Dental  (+) Teeth Intact   Pulmonary neg pulmonary ROS,    breath sounds clear to auscultation       Cardiovascular Exercise Tolerance: Good hypertension, Pt. on medications  Rhythm:Regular Rate:Normal     Neuro/Psych negative psych ROS   GI/Hepatic Neg liver ROS, GERD  ,  Endo/Other  Hypothyroidism   Renal/GU negative Renal ROS     Musculoskeletal   Abdominal Normal abdominal exam  (+)   Peds negative pediatric ROS (+)  Hematology  (+) anemia ,   Anesthesia Other Findings   Reproductive/Obstetrics                             Anesthesia Physical Anesthesia Plan  ASA: II  Anesthesia Plan: General   Post-op Pain Management:    Induction: Intravenous  Airway Management Planned: Natural Airway and Nasal Cannula  Additional Equipment:   Intra-op Plan:   Post-operative Plan:   Informed Consent: I have reviewed the patients History and Physical, chart, labs and discussed the procedure including the risks, benefits and alternatives for the proposed anesthesia with the patient or authorized representative who has indicated his/her understanding and acceptance.     Plan Discussed with: CRNA  Anesthesia Plan Comments:         Anesthesia Quick Evaluation

## 2016-09-07 NOTE — Op Note (Signed)
[facility] Gastroenterology Patient Name: Courtney Manning Procedure Date: 09/07/2016 10:03 AM MRN: [id number] Account #: [id number] Date of Birth: 03/20/1929 Admit Type: Outpatient Age: 82 Room: [facility] ENDO ROOM 1 Gender: Female Note Status: Finalized Procedure:            Colonoscopy Indications:          Iron deficiency anemia Providers:            Lollie Sails, MD Referring MD:         Leonie Douglas. Doy Hutching, MD (Referring MD) Medicines:            Monitored Anesthesia Care Complications:        No immediate complications. Procedure:            Pre-Anesthesia Assessment:                       - ASA Grade Assessment: III - A patient with severe                        systemic disease.                       After obtaining informed consent, the colonoscope was                        passed under direct vision. Throughout the procedure,                        the patient's blood pressure, pulse, and oxygen                        saturations were monitored continuously. The                        Colonoscope was introduced through the anus and                        advanced to the the cecum, identified by appendiceal                        orifice and ileocecal valve. The colonoscopy was                        unusually difficult due to a tortuous colon. Successful                        completion of the procedure was aided by changing the                        patient to a supine position and using manual pressure.                        The patient tolerated the procedure well. The quality                        of the bowel preparation was good. Findings:      The sigmoid colon, descending colon and transverse colon were       significantly redundant.      Multiple small-mouthed diverticula were found in the sigmoid colon and  descending colon.      A single small localized angiodysplastic lesion with typical       arborization was found in the transverse  colon, with several small loops       close to the surgace of the mucosa. Coagulation for tissue destruction       using argon plasma at 0.5 liters/minute and 20 watts was successful.      The digital rectal exam was normal. Impression:           - Redundant colon.                       - Diverticulosis in the sigmoid colon and in the                        descending colon.                       - A single colonic angiodysplastic lesion. Treated with                        argon plasma coagulation (APC).                       - No specimens collected. Recommendation:       - Discharge patient to home.                       - Full liquid diet for 2 days, then advance as                        tolerated to soft diet for 2 days. Procedure Code(s):    --- Professional ---                       [phone], Colonoscopy, flexible; with ablation of                        tumor(s), polyp(s), or other lesion(s) (includes pre-                        and post-dilation and guide wire passage, when                        performed) Diagnosis Code(s):    --- Professional ---                       K55.20, Angiodysplasia of colon without hemorrhage                       D50.9, Iron deficiency anemia, unspecified                       K57.30, Diverticulosis of large intestine without                        perforation or abscess without bleeding                       Q43.8, Other specified congenital malformations of                        intestine CPT copyright 2016 American  Medical Association. All rights reserved. The codes documented in this report are preliminary and upon coder review may  be revised to meet current compliance requirements. Lollie Sails, MD 09/07/2016 11:23:02 AM This report has been signed electronically. Number of Addenda: 0 Note Initiated On: 09/07/2016 10:03 AM Scope Withdrawal Time: 0 hours 18 minutes 7 seconds  Total Procedure Duration: 0 hours 38 minutes 54 seconds        [facility]

## 2016-09-08 ENCOUNTER — Encounter: Payer: Self-pay | Admitting: Gastroenterology

## 2016-09-11 LAB — SURGICAL PATHOLOGY

## 2016-09-12 ENCOUNTER — Other Ambulatory Visit: Payer: Self-pay | Admitting: Gastroenterology

## 2016-09-12 DIAGNOSIS — K5520 Angiodysplasia of colon without hemorrhage: Secondary | ICD-10-CM

## 2016-09-18 ENCOUNTER — Ambulatory Visit
Admission: RE | Admit: 2016-09-18 | Discharge: 2016-09-18 | Disposition: A | Discharge status: Home / Self Care | Payer: Medicare Other | Source: Ambulatory Visit | Attending: Gastroenterology | Admitting: Gastroenterology

## 2016-09-18 DIAGNOSIS — Q2733 Arteriovenous malformation of digestive system vessel: Secondary | ICD-10-CM | POA: Diagnosis not present

## 2016-09-18 DIAGNOSIS — K558 Other vascular disorders of intestine: Secondary | ICD-10-CM | POA: Diagnosis present

## 2016-09-18 DIAGNOSIS — K5520 Angiodysplasia of colon without hemorrhage: Secondary | ICD-10-CM

## 2016-09-19 ENCOUNTER — Inpatient Hospital Stay: Payer: Medicare Other | Attending: Internal Medicine

## 2016-09-19 DIAGNOSIS — D508 Other iron deficiency anemias: Secondary | ICD-10-CM

## 2016-09-19 DIAGNOSIS — E039 Hypothyroidism, unspecified: Secondary | ICD-10-CM | POA: Insufficient documentation

## 2016-09-19 DIAGNOSIS — H409 Unspecified glaucoma: Secondary | ICD-10-CM | POA: Diagnosis not present

## 2016-09-19 DIAGNOSIS — Z8541 Personal history of malignant neoplasm of cervix uteri: Secondary | ICD-10-CM | POA: Diagnosis not present

## 2016-09-19 DIAGNOSIS — I10 Essential (primary) hypertension: Secondary | ICD-10-CM | POA: Insufficient documentation

## 2016-09-19 DIAGNOSIS — R300 Dysuria: Secondary | ICD-10-CM | POA: Insufficient documentation

## 2016-09-19 DIAGNOSIS — D500 Iron deficiency anemia secondary to blood loss (chronic): Secondary | ICD-10-CM | POA: Diagnosis present

## 2016-09-19 DIAGNOSIS — K2940 Chronic atrophic gastritis without bleeding: Secondary | ICD-10-CM | POA: Insufficient documentation

## 2016-09-19 DIAGNOSIS — Z7982 Long term (current) use of aspirin: Secondary | ICD-10-CM | POA: Insufficient documentation

## 2016-09-19 DIAGNOSIS — M5136 Other intervertebral disc degeneration, lumbar region: Secondary | ICD-10-CM | POA: Insufficient documentation

## 2016-09-19 DIAGNOSIS — N179 Acute kidney failure, unspecified: Secondary | ICD-10-CM | POA: Insufficient documentation

## 2016-09-19 DIAGNOSIS — G8929 Other chronic pain: Secondary | ICD-10-CM | POA: Insufficient documentation

## 2016-09-19 DIAGNOSIS — K219 Gastro-esophageal reflux disease without esophagitis: Secondary | ICD-10-CM | POA: Insufficient documentation

## 2016-09-19 DIAGNOSIS — Q2733 Arteriovenous malformation of digestive system vessel: Secondary | ICD-10-CM | POA: Diagnosis not present

## 2016-09-19 DIAGNOSIS — R0602 Shortness of breath: Secondary | ICD-10-CM | POA: Insufficient documentation

## 2016-09-19 DIAGNOSIS — M549 Dorsalgia, unspecified: Secondary | ICD-10-CM | POA: Diagnosis not present

## 2016-09-19 DIAGNOSIS — Z79899 Other long term (current) drug therapy: Secondary | ICD-10-CM | POA: Diagnosis not present

## 2016-09-19 LAB — CBC WITH DIFFERENTIAL/PLATELET
Basophils Absolute: 0 10*3/uL (ref 0–0.1)
Basophils Relative: 1 %
EOS ABS: 0.5 10*3/uL (ref 0–0.7)
EOS PCT: 7 %
HCT: 33.4 % — ABNORMAL LOW (ref 35.0–47.0)
HEMOGLOBIN: 11.2 g/dL — AB (ref 12.0–16.0)
LYMPHS ABS: 1.1 10*3/uL (ref 1.0–3.6)
LYMPHS PCT: 16 %
MCH: 29.4 pg (ref 26.0–34.0)
MCHC: 33.5 g/dL (ref 32.0–36.0)
MCV: 87.7 fL (ref 80.0–100.0)
MONOS PCT: 9 %
Monocytes Absolute: 0.6 10*3/uL (ref 0.2–0.9)
Neutro Abs: 4.5 10*3/uL (ref 1.4–6.5)
Neutrophils Relative %: 67 %
PLATELETS: 282 10*3/uL (ref 150–440)
RBC: 3.81 MIL/uL (ref 3.80–5.20)
RDW: 13.3 % (ref 11.5–14.5)
WBC: 6.6 10*3/uL (ref 3.6–11.0)

## 2016-09-19 LAB — FERRITIN: Ferritin: 295 ng/mL (ref 11–307)

## 2016-09-19 LAB — BASIC METABOLIC PANEL
Anion gap: 8 (ref 5–15)
BUN: 32 mg/dL — ABNORMAL HIGH (ref 6–20)
CALCIUM: 9.2 mg/dL (ref 8.9–10.3)
CO2: 27 mmol/L (ref 22–32)
CREATININE: 1.89 mg/dL — AB (ref 0.44–1.00)
Chloride: 97 mmol/L — ABNORMAL LOW (ref 101–111)
GFR calc Af Amer: 27 mL/min — ABNORMAL LOW (ref >60)
GFR, EST NON AFRICAN AMERICAN: 23 mL/min — AB (ref >60)
GLUCOSE: 99 mg/dL (ref 65–99)
Potassium: 5.1 mmol/L (ref 3.5–5.1)
Sodium: 132 mmol/L — ABNORMAL LOW (ref 135–145)

## 2016-09-19 LAB — IRON AND TIBC
Iron: 107 ug/dL (ref 28–170)
Saturation Ratios: 32 % — ABNORMAL HIGH (ref 10.4–31.8)
TIBC: 335 ug/dL (ref 250–450)
UIBC: 228 ug/dL

## 2016-09-26 ENCOUNTER — Telehealth: Payer: Self-pay

## 2016-09-26 ENCOUNTER — Inpatient Hospital Stay: Payer: Medicare Other

## 2016-09-26 ENCOUNTER — Other Ambulatory Visit: Payer: Self-pay | Admitting: Internal Medicine

## 2016-09-26 ENCOUNTER — Inpatient Hospital Stay (HOSPITAL_BASED_OUTPATIENT_CLINIC_OR_DEPARTMENT_OTHER): Payer: Medicare Other | Admitting: Internal Medicine

## 2016-09-26 VITALS — BP 136/71 | HR 82 | Temp 97.4°F | Resp 18 | Wt 107.0 lb

## 2016-09-26 DIAGNOSIS — Q2733 Arteriovenous malformation of digestive system vessel: Secondary | ICD-10-CM

## 2016-09-26 DIAGNOSIS — Z7982 Long term (current) use of aspirin: Secondary | ICD-10-CM

## 2016-09-26 DIAGNOSIS — G8929 Other chronic pain: Secondary | ICD-10-CM

## 2016-09-26 DIAGNOSIS — Z79899 Other long term (current) drug therapy: Secondary | ICD-10-CM

## 2016-09-26 DIAGNOSIS — N179 Acute kidney failure, unspecified: Secondary | ICD-10-CM

## 2016-09-26 DIAGNOSIS — D500 Iron deficiency anemia secondary to blood loss (chronic): Secondary | ICD-10-CM

## 2016-09-26 DIAGNOSIS — E039 Hypothyroidism, unspecified: Secondary | ICD-10-CM

## 2016-09-26 DIAGNOSIS — K2940 Chronic atrophic gastritis without bleeding: Secondary | ICD-10-CM

## 2016-09-26 DIAGNOSIS — R300 Dysuria: Secondary | ICD-10-CM

## 2016-09-26 DIAGNOSIS — R0602 Shortness of breath: Secondary | ICD-10-CM

## 2016-09-26 DIAGNOSIS — M5136 Other intervertebral disc degeneration, lumbar region: Secondary | ICD-10-CM

## 2016-09-26 DIAGNOSIS — H409 Unspecified glaucoma: Secondary | ICD-10-CM

## 2016-09-26 DIAGNOSIS — M549 Dorsalgia, unspecified: Secondary | ICD-10-CM

## 2016-09-26 DIAGNOSIS — Z8541 Personal history of malignant neoplasm of cervix uteri: Secondary | ICD-10-CM

## 2016-09-26 DIAGNOSIS — K219 Gastro-esophageal reflux disease without esophagitis: Secondary | ICD-10-CM

## 2016-09-26 DIAGNOSIS — I10 Essential (primary) hypertension: Secondary | ICD-10-CM

## 2016-09-26 LAB — BASIC METABOLIC PANEL
ANION GAP: 9 (ref 5–15)
BUN: 33 mg/dL — ABNORMAL HIGH (ref 6–20)
CHLORIDE: 101 mmol/L (ref 101–111)
CO2: 26 mmol/L (ref 22–32)
Calcium: 9.3 mg/dL (ref 8.9–10.3)
Creatinine, Ser: 1.69 mg/dL — ABNORMAL HIGH (ref 0.44–1.00)
GFR calc Af Amer: 31 mL/min — ABNORMAL LOW (ref >60)
GFR, EST NON AFRICAN AMERICAN: 26 mL/min — AB (ref >60)
GLUCOSE: 86 mg/dL (ref 65–99)
POTASSIUM: 4.9 mmol/L (ref 3.5–5.1)
Sodium: 136 mmol/L (ref 135–145)

## 2016-09-26 LAB — URINALYSIS COMPLETE WITH MICROSCOPIC (ARMC ONLY)
BILIRUBIN URINE: NEGATIVE
Glucose, UA: NEGATIVE mg/dL
KETONES UR: NEGATIVE mg/dL
Nitrite: NEGATIVE
PH: 6 (ref 5.0–8.0)
Protein, ur: 30 mg/dL — AB
SQUAMOUS EPITHELIAL / LPF: NONE SEEN
Specific Gravity, Urine: 1.015 (ref 1.005–1.030)

## 2016-09-26 MED ORDER — SULFAMETHOXAZOLE-TRIMETHOPRIM 800-160 MG PO TABS: 1.0000 | ORAL_TABLET | Freq: Every day | ORAL | 0 refills | Status: DC

## 2016-09-26 MED ORDER — AMLODIPINE BESYLATE 5 MG PO TABS: 5.0000 mg | ORAL_TABLET | Freq: Every day | ORAL | 3 refills | Status: DC

## 2016-09-26 NOTE — Telephone Encounter (Signed)
Called patient to let her know the medication has been sent in to the pharmacy.

## 2016-09-26 NOTE — Telephone Encounter (Signed)
-----   Message from [name], MD sent at 09/26/2016  1:56 PM EST ----- Please inform patient that she appears to have a UTI; awaiting on the cultures. Please inform that I called in a prescription for Bactrim x5 days; and she needs to follow up with pcp- if symptoms donot improve. Thx

## 2016-09-26 NOTE — Assessment & Plan Note (Addendum)
IDA-  Likely GI blood loss status st IV Venofer;  Most recent  Iron studies- saturation 30%/ hemgloin11;  Recommend by mouth ion at ths tme. No IV iro today.  #  GI blod loss-  : AvM status post- laser treatment.   # acute kidney injury creatinine 1.89;  Repeat BMP today.  Recommend stopping lisinopril.  recommed increasing fluid intake.  #  Dysuria-  Recommend UA today. If positive recommend antibiotics.  #   Hypertension-  recommend Norvasc 5 mg once a day check blood pressure frequently at home. And if elevated/ systolic greater than 672;  Diastolic greater than 90-  Recommend calling PCP's office. Will inform pt's pcp's office re: plan.   # recommend follow-up 4 months/labs/- 1 week prior Possible Venofer.

## 2016-09-26 NOTE — Progress Notes (Signed)
[facility] NOTE  Patient Care Team: Idelle Crouch, MD as PCP - General (Internal Medicine)  CHIEF COMPLAINTS/PURPOSE OF CONSULTATION:   # July 2017  IRON DEFICEINCY ANEMIA [since Feb 2016] IV vennofer; NOV 2017- colo- transverse colon AVM s/p APC; EGD- atrophic gastritis [Dr.Skulskie]  HISTORY OF PRESENTING ILLNESS:  Patient-  Poor historian/ vague.  Courtney Manning 82 y.o.  female is here post IV iron for iron deficiency anemia-  Patient stau post  EGD colonoscopy. Colonoscopy showedAVM status post laser.    patient complains of  Decreased by mouth intake. She denies any NSAIDs.  complints of burning pain during urination.  Also lower pelvic discomfort. No fevers. No worsening back pain. Patient has chronic bck paiDenies any unusual constipation or diarrhea.  Notes to  have some improvement of shortness of breath. Denies any night sweats or fevers.   ROS: A complete 10 point review of system is done which is negative except mentioned above in history of present illness  MEDICAL HISTORY:  Past Medical History:  Diagnosis Date  . Anemia   . Cervical cancer ([place])    Uterine Cancer  . Constipation   . GERD (gastroesophageal reflux disease)   . Glaucoma   . Hypertension   . Hypothyroidism   . Insomnia   . Spinal stenosis   . Thyroid disease     SURGICAL HISTORY: Past Surgical History:  Procedure Laterality Date  . ABDOMINAL HYSTERECTOMY    . BACK SURGERY    . COLONOSCOPY WITH PROPOFOL N/A 09/07/2016   Procedure: COLONOSCOPY WITH PROPOFOL;  Surgeon: Lollie Sails, MD;  Location[facility] ENDOSCOPY;  Service: Endoscopy;  Laterality: N/A;  . ESOPHAGOGASTRODUODENOSCOPY (EGD) WITH PROPOFOL N/A 09/07/2016   Procedure: ESOPHAGOGASTRODUODENOSCOPY (EGD) WITH PROPOFOL;  Surgeon: Lollie Sails, MD;  Location: [facility] ENDOSCOPY;  Service: Endoscopy;  Laterality: N/A;  . HIP SURGERY    . JOINT REPLACEMENT     Hip Replacement  . KYPHOPLASTY N/A 06/17/2015   Procedure: KYPHOPLASTY;  Surgeon: Hessie Knows, MD;  Location: [facility] ORS;  Service: Orthopedics;  Laterality: N/A;  . PACEMAKER INSERTION    . SPINE SURGERY      SOCIAL HISTORY: No smoking or alcohol. Social History   Social History  . Marital status: Married    Spouse name: N/A  . Number of children: N/A  . Years of education: N/A   Occupational History  . Not on file.   Social History Main Topics  . Smoking status: Never Smoker  . Smokeless tobacco: Never Used  . Alcohol use No  . Drug use: No  . Sexual activity: Not on file   Other Topics Concern  . Not on file   Social History Narrative  . No narrative on file    FAMILY HISTORY:Denies any family history of cancer. Family History  Problem Relation Age of Onset  . Diabetes Mellitus II Mother   . Diabetes Mellitus II Father   . Hypertension Father     ALLERGIES:  is allergic to codeine; brimonidine tartrate; fluoxetine; lodine [etodolac]; morphine; omeprazole; ciprofloxacin; and gabapentin.  MEDICATIONS:  Current Outpatient Prescriptions  Medication Sig Dispense Refill  . Cholecalciferol (VITAMIN D3) 100000 UNIT/GM POWD Take by mouth.    . Coenzyme Q10 100 MG capsule Take 1 capsule by mouth daily.    Marland Kitchen lisinopril (PRINIVIL,ZESTRIL) 5 MG tablet Take 5 mg by mouth daily.    . Melatonin 3 MG TABS Take 1 tablet by mouth daily as needed.    Marland Kitchen  oxyCODONE-acetaminophen (PERCOCET/ROXICET) 5-325 MG tablet Take 1 tablet by mouth 3 times/day as needed-between meals & bedtime.     Marland Kitchen thyroid (ARMOUR) 120 MG tablet Take 120 mg by mouth daily before breakfast.    . triamcinolone cream (KENALOG) 0.1 % Apply 1 application topically as needed.    . vitamin E 200 UNIT capsule Take 200 Units by mouth daily.    Marland Kitchen amLODipine (NORVASC) 5 MG tablet Take 1 tablet (5 mg total) by mouth daily. 30 tablet 3  . aspirin EC 81 MG tablet Take 1 tablet by mouth daily.    Marland Kitchen ibuprofen (ADVIL,MOTRIN) 600 MG tablet Take 600 mg by mouth every 6 (six)  hours as needed.    . lansoprazole (PREVACID) 30 MG capsule Take 30 mg by mouth daily at 12 noon.    . traZODone (DESYREL) 50 MG tablet Take 50 mg by mouth at bedtime.     No current facility-administered medications for this visit.      PHYSICAL EXAMINATION:   Vitals:   09/26/16 1003  BP: 136/71  Pulse: 82  Resp: 18  Temp: 97.4 F (36.3 C)   Filed Weights   09/26/16 1003  Weight: 107 lb 0.5 oz (48.5 kg)    GENERAL: Well-nourished well-developed; Alert, no distress and comfortable.  Accompanied by her husband. She walks with a cane. EYES: no pallor or icterus OROPHARYNX: no thrush or ulceration; good dentition  NECK: supple, no masses felt LYMPH:  no palpable lymphadenopathy in the cervical, axillary or inguinal regions LUNGS: clear to auscultation and  No wheeze or crackles HEART/CVS: regular rate & rhythm and no murmurs; No lower extremity edema ABDOMEN: abdomen soft, non-tender and normal bowel sounds Musculoskeletal:no cyanosis of digits and no clubbing  PSYCH: alert & oriented x 3 with fluent speech NEURO: no focal motor/sensory deficits SKIN:  no rashes or significant lesions  LABORATORY DATA:  I have reviewed the data as listed Lab Results  Component Value Date   WBC 6.6 09/19/2016   HGB 11.2 (L) 09/19/2016   HCT 33.4 (L) 09/19/2016   MCV 87.7 09/19/2016   PLT 282 09/19/2016    Recent Labs  05/16/16 1447 06/27/16 0914 09/19/16 1026  NA 132* 136 132*  K 4.9 5.0 5.1  CL 102 104 97*  CO2 26 26 27   GLUCOSE 104* 98 99  BUN 35* 36* 32*  CREATININE 1.34* 1.52* 1.89*  CALCIUM 9.0 9.2 9.2  GFRNONAA 35* 30* 23*  GFRAA 41* 35* 27*  PROT 7.0 7.2  --   ALBUMIN 3.9 4.3  --   AST 21 24  --   ALT 20 23  --   ALKPHOS 81 86  --   BILITOT 0.5 0.4  --     RADIOGRAPHIC STUDIES: I have personally reviewed the radiological images as listed and agreed with the findings in the report. Dg Small Bowel  Result Date: 09/18/2016 CLINICAL DATA:  Gastrointestinal  arteriovenous malformation EXAM: SMALL BOWEL SERIES COMPARISON:  None. TECHNIQUE: Following ingestion of thin barium, serial small bowel images were obtained including spot views of the terminal ileum. FLUOROSCOPY TIME:  Fluoroscopy Time:  0 minutes, 24 seconds Radiation Exposure Index (if provided by the fluoroscopic device): 428.60 micro Gy per meter square Number of Acquired Spot Images: 7 overhead images and 3 fluoro spot. FINDINGS: The scout film reveals a moderate colonic stool burden. No small or large bowel obstructive pattern is observed. The patient has undergone previous kyphoplasty at T12. There is multilevel degenerative disc disease  of the lumbar spine with loss of height of L3. There is dense calcification in the wall of the abdominal aorta and common iliac vessels. Over the course of 1 hour and 45 minutes the barium passed from the stomach to the right colon. The age the jejunum demonstrated normal feathery mucosal pattern. The ileal mucosal pattern was also normal. The terminal ileum was observed and appeared normal with normal peristalsis observed passing through it. No filling defects were observed within the bowel loops. IMPRESSION: Normal small bowel series. If it is felt that additional imaging would be contributory to the patient's care, CT enterography may be useful. Electronically Signed   By: David  Martinique M.D.   On: 09/18/2016 12:04    ASSESSMENT & PLAN:  Iron deficiency anemia due to chronic blood loss IDA-  Likely GI blood loss status st IV Venofer;  Most recent  Iron studies- saturation 30%/ hemgloin11;  Recommend by mouth ion at ths tme. No IV iro today.  #  GI blod loss-  : AvM status post- laser treatment.   # acute kidney injury creatinine 1.89;  Repeat BMP today.  Recommend stopping lisinopril.  recommed increasing fluid intake.  #  Dysuria-  Recommend UA today. If positive recommend antibiotics.  #   Hypertension-  recommend Norvasc 5 mg once a day check blood  pressure frequently at home. And if elevated/ systolic greater than 914;  Diastolic greater than 90-  Recommend calling PCP's office. Will inform pt's pcp's office re: plan.   # recommend follow-up 4 months/labs/- 1 week prior Possible Venofer.   All questions were answered. The patient knows to call the clinic with any problems, questions or concerns.    [name], MD 09/26/2016 10:30 AM

## 2016-09-26 NOTE — Progress Notes (Signed)
Patient is here for follow up, she does mention she has some Dysuria for the past week. She says it is burning right now. Would like to see if we can order a UA for her

## 2016-09-28 LAB — URINE CULTURE: Culture: >=100000 — AB

## 2016-10-02 ENCOUNTER — Other Ambulatory Visit: Payer: Self-pay

## 2016-10-02 ENCOUNTER — Inpatient Hospital Stay
Admission: EM | Admit: 2016-10-02 | Discharge: 2016-10-03 | DRG: 683 | Disposition: A | Discharge status: Home / Self Care | Payer: Medicare Other | Attending: Internal Medicine | Admitting: Internal Medicine

## 2016-10-02 ENCOUNTER — Encounter: Payer: Self-pay | Admitting: Emergency Medicine

## 2016-10-02 ENCOUNTER — Emergency Department: Payer: Medicare Other

## 2016-10-02 DIAGNOSIS — K219 Gastro-esophageal reflux disease without esophagitis: Secondary | ICD-10-CM | POA: Diagnosis present

## 2016-10-02 DIAGNOSIS — Z8541 Personal history of malignant neoplasm of cervix uteri: Secondary | ICD-10-CM | POA: Diagnosis not present

## 2016-10-02 DIAGNOSIS — Z888 Allergy status to other drugs, medicaments and biological substances status: Secondary | ICD-10-CM

## 2016-10-02 DIAGNOSIS — Z950 Presence of cardiac pacemaker: Secondary | ICD-10-CM | POA: Diagnosis not present

## 2016-10-02 DIAGNOSIS — R7989 Other specified abnormal findings of blood chemistry: Secondary | ICD-10-CM | POA: Diagnosis present

## 2016-10-02 DIAGNOSIS — N179 Acute kidney failure, unspecified: Principal | ICD-10-CM | POA: Diagnosis present

## 2016-10-02 DIAGNOSIS — Z885 Allergy status to narcotic agent status: Secondary | ICD-10-CM

## 2016-10-02 DIAGNOSIS — I129 Hypertensive chronic kidney disease with stage 1 through stage 4 chronic kidney disease, or unspecified chronic kidney disease: Secondary | ICD-10-CM | POA: Diagnosis present

## 2016-10-02 DIAGNOSIS — N183 Chronic kidney disease, stage 3 (moderate): Secondary | ICD-10-CM | POA: Diagnosis present

## 2016-10-02 DIAGNOSIS — D508 Other iron deficiency anemias: Secondary | ICD-10-CM | POA: Diagnosis present

## 2016-10-02 DIAGNOSIS — D631 Anemia in chronic kidney disease: Secondary | ICD-10-CM | POA: Diagnosis present

## 2016-10-02 DIAGNOSIS — Z96649 Presence of unspecified artificial hip joint: Secondary | ICD-10-CM | POA: Diagnosis present

## 2016-10-02 DIAGNOSIS — E871 Hypo-osmolality and hyponatremia: Secondary | ICD-10-CM | POA: Diagnosis present

## 2016-10-02 DIAGNOSIS — E860 Dehydration: Secondary | ICD-10-CM | POA: Diagnosis present

## 2016-10-02 DIAGNOSIS — Z90710 Acquired absence of both cervix and uterus: Secondary | ICD-10-CM | POA: Diagnosis not present

## 2016-10-02 DIAGNOSIS — R4182 Altered mental status, unspecified: Secondary | ICD-10-CM | POA: Diagnosis present

## 2016-10-02 DIAGNOSIS — Z8542 Personal history of malignant neoplasm of other parts of uterus: Secondary | ICD-10-CM | POA: Diagnosis not present

## 2016-10-02 DIAGNOSIS — R55 Syncope and collapse: Secondary | ICD-10-CM | POA: Diagnosis present

## 2016-10-02 DIAGNOSIS — E039 Hypothyroidism, unspecified: Secondary | ICD-10-CM | POA: Diagnosis present

## 2016-10-02 LAB — TSH: TSH: 0.939 u[IU]/mL (ref 0.350–4.500)

## 2016-10-02 LAB — URINALYSIS COMPLETE WITH MICROSCOPIC (ARMC ONLY)
BILIRUBIN URINE: NEGATIVE
Bacteria, UA: NONE SEEN
Glucose, UA: NEGATIVE mg/dL
HGB URINE DIPSTICK: NEGATIVE
KETONES UR: NEGATIVE mg/dL
LEUKOCYTES UA: NEGATIVE
NITRITE: NEGATIVE
PH: 7 (ref 5.0–8.0)
Protein, ur: NEGATIVE mg/dL
SPECIFIC GRAVITY, URINE: 1.006 (ref 1.005–1.030)
Squamous Epithelial / LPF: NONE SEEN

## 2016-10-02 LAB — COMPREHENSIVE METABOLIC PANEL
ALBUMIN: 3.9 g/dL (ref 3.5–5.0)
ALT: 28 U/L (ref 14–54)
ANION GAP: 9 (ref 5–15)
AST: 42 U/L — ABNORMAL HIGH (ref 15–41)
Alkaline Phosphatase: 60 U/L (ref 38–126)
BILIRUBIN TOTAL: 0.4 mg/dL (ref 0.3–1.2)
BUN: 23 mg/dL — ABNORMAL HIGH (ref 6–20)
CHLORIDE: 91 mmol/L — AB (ref 101–111)
CO2: 23 mmol/L (ref 22–32)
Calcium: 9.3 mg/dL (ref 8.9–10.3)
Creatinine, Ser: 1.9 mg/dL — ABNORMAL HIGH (ref 0.44–1.00)
GFR calc Af Amer: 27 mL/min — ABNORMAL LOW (ref >60)
GFR, EST NON AFRICAN AMERICAN: 23 mL/min — AB (ref >60)
GLUCOSE: 113 mg/dL — AB (ref 65–99)
POTASSIUM: 4.2 mmol/L (ref 3.5–5.1)
Sodium: 123 mmol/L — ABNORMAL LOW (ref 135–145)
TOTAL PROTEIN: 6.8 g/dL (ref 6.5–8.1)

## 2016-10-02 LAB — SODIUM, URINE, RANDOM: Sodium, Ur: 47 mmol/L

## 2016-10-02 LAB — OSMOLALITY, URINE: Osmolality, Ur: 227 mOsm/kg — ABNORMAL LOW (ref 300–900)

## 2016-10-02 LAB — CBC
HEMATOCRIT: 31.8 % — AB (ref 35.0–47.0)
Hemoglobin: 10.9 g/dL — ABNORMAL LOW (ref 12.0–16.0)
MCH: 29.9 pg (ref 26.0–34.0)
MCHC: 34.3 g/dL (ref 32.0–36.0)
MCV: 87.3 fL (ref 80.0–100.0)
PLATELETS: 229 10*3/uL (ref 150–440)
RBC: 3.64 MIL/uL — ABNORMAL LOW (ref 3.80–5.20)
RDW: 12.8 % (ref 11.5–14.5)
WBC: 5.1 10*3/uL (ref 3.6–11.0)

## 2016-10-02 LAB — TROPONIN I

## 2016-10-02 MED ORDER — THYROID 60 MG PO TABS
120.0000 mg | ORAL_TABLET | Freq: Every day | ORAL | Status: DC
  Administered 2016-10-03: 120 mg via ORAL
  Filled 2016-10-02: qty 2

## 2016-10-02 MED ORDER — SODIUM CHLORIDE 0.9 % IV BOLUS (SEPSIS)
1000.0000 mL | Freq: Once | INTRAVENOUS | Status: AC
  Administered 2016-10-02: 1000 mL via INTRAVENOUS

## 2016-10-02 MED ORDER — SODIUM CHLORIDE 0.9 % IV SOLN
INTRAVENOUS | Status: DC
  Administered 2016-10-02 – 2016-10-03 (×2): via INTRAVENOUS

## 2016-10-02 MED ORDER — AMLODIPINE BESYLATE 5 MG PO TABS
5.0000 mg | ORAL_TABLET | Freq: Every day | ORAL | Status: DC
  Administered 2016-10-03: 5 mg via ORAL
  Filled 2016-10-02: qty 1

## 2016-10-02 MED ORDER — HEPARIN SODIUM (PORCINE) 5000 UNIT/ML IJ SOLN
5000.0000 [IU] | Freq: Three times a day (TID) | INTRAMUSCULAR | Status: DC
  Administered 2016-10-02 – 2016-10-03 (×3): 5000 [IU] via SUBCUTANEOUS
  Filled 2016-10-02 (×3): qty 1

## 2016-10-02 MED ORDER — COENZYME Q10 100 MG PO CAPS: 1.0000 | ORAL_CAPSULE | Freq: Every day | ORAL | Status: DC

## 2016-10-02 MED ORDER — OXYCODONE-ACETAMINOPHEN 5-325 MG PO TABS
1.0000 | ORAL_TABLET | Freq: Three times a day (TID) | ORAL | Status: DC | PRN
  Administered 2016-10-03: 1 via ORAL
  Filled 2016-10-02: qty 1

## 2016-10-02 MED ORDER — ASPIRIN EC 81 MG PO TBEC
81.0000 mg | DELAYED_RELEASE_TABLET | Freq: Every day | ORAL | Status: DC
  Administered 2016-10-03: 81 mg via ORAL
  Filled 2016-10-02: qty 1

## 2016-10-02 NOTE — ED Notes (Addendum)
Report to [facility], pt prepared for transfer to floor.

## 2016-10-02 NOTE — H&P (Signed)
[facility] NAME: Courtney Manning    MR#:  [id number]  DATE OF BIRTH:  08/21/1929  DATE OF ADMISSION:  10/02/2016  PRIMARY CARE PHYSICIAN: SPARKS,JEFFREY D, MD   REQUESTING/REFERRING PHYSICIAN: paduchowski CHIEF COMPLAINT:   Chief Complaint  Patient presents with  . Loss of Consciousness    HISTORY OF PRESENT ILLNESS: Courtney Manning  is a 82 y.o. female with a known history of Cervical cancer, glaucoma, gastroesophageal reflux disease, hypertension, hypothyroidism, thyroid disease- lives with husband and independent in her day-to-day activity, healthy use a cane to move around. 2 weeks ago she was diagnosed having UTI and was treated with Bactrim. Today morning while she was having her breakfast she suddenly felt syncopal episode. She did not had any prodromal symptoms and husband was nearby so he held her. With help of her husband she was able to walk up to the bedroom, so it is questionable if she completely passed out. He made her sleep in the bed and after a few minutes when she completely became alert and oriented she did not remember much about the details. She denies any associated chest pain, palpitation, shortness of breath, focal weakness, headache. Concerned with this she was brought to emergency room and her sodium was noted low so she is given his admission for symptomatic hyponatremia.  PAST MEDICAL HISTORY:   Past Medical History:  Diagnosis Date  . Anemia   . Cervical cancer ([place])    Uterine Cancer  . Constipation   . GERD (gastroesophageal reflux disease)   . Glaucoma   . Hypertension   . Hypothyroidism   . Insomnia   . Spinal stenosis   . Thyroid disease     PAST SURGICAL HISTORY: Past Surgical History:  Procedure Laterality Date  . ABDOMINAL HYSTERECTOMY    . BACK SURGERY    . COLONOSCOPY WITH PROPOFOL N/A 09/07/2016   Procedure: COLONOSCOPY WITH PROPOFOL;  Surgeon: Lollie Sails, MD;  Location[facility] ENDOSCOPY;   Service: Endoscopy;  Laterality: N/A;  . ESOPHAGOGASTRODUODENOSCOPY (EGD) WITH PROPOFOL N/A 09/07/2016   Procedure: ESOPHAGOGASTRODUODENOSCOPY (EGD) WITH PROPOFOL;  Surgeon: Lollie Sails, MD;  Location: [facility] ENDOSCOPY;  Service: Endoscopy;  Laterality: N/A;  . HIP SURGERY    . JOINT REPLACEMENT     Hip Replacement  . KYPHOPLASTY N/A 06/17/2015   Procedure: KYPHOPLASTY;  Surgeon: Hessie Knows, MD;  Location: [facility] ORS;  Service: Orthopedics;  Laterality: N/A;  . PACEMAKER INSERTION    . SPINE SURGERY      SOCIAL HISTORY:  Social History  Substance Use Topics  . Smoking status: Never Smoker  . Smokeless tobacco: Never Used  . Alcohol use No    FAMILY HISTORY:  Family History  Problem Relation Age of Onset  . Diabetes Mellitus II Mother   . Diabetes Mellitus II Father   . Hypertension Father     DRUG ALLERGIES:  Allergies  Allergen Reactions  . Codeine Anaphylaxis    Not Specified  . Brimonidine Tartrate Itching  . Fluoxetine Other (See Comments)    Reaction: Unknown  . Lodine [Etodolac]   . Morphine Itching  . Omeprazole Nausea And Vomiting  . Ciprofloxacin Rash  . Gabapentin Rash    REVIEW OF SYSTEMS:   CONSTITUTIONAL: No fever, fatigue or weakness. Had syncopal episode. EYES: No blurred or double vision.  EARS, NOSE, AND THROAT: No tinnitus or ear pain.  RESPIRATORY: No cough, shortness of breath, wheezing or hemoptysis.  CARDIOVASCULAR: No  chest pain, orthopnea, edema.  GASTROINTESTINAL: No nausea, vomiting, diarrhea or abdominal pain.  GENITOURINARY: No dysuria, hematuria.  ENDOCRINE: No polyuria, nocturia,  HEMATOLOGY: No anemia, easy bruising or bleeding SKIN: No rash or lesion. MUSCULOSKELETAL: No joint pain or arthritis.   NEUROLOGIC: No tingling, numbness, weakness.  PSYCHIATRY: No anxiety or depression.   MEDICATIONS AT HOME:  Prior to Admission medications   Medication Sig Start Date End Date Taking? Authorizing Provider  amLODipine (NORVASC) 5  MG tablet Take 1 tablet (5 mg total) by mouth daily. 09/26/16  Yes [name], MD  aspirin EC 81 MG tablet Take 1 tablet by mouth daily.   Yes Historical Provider, MD  Cholecalciferol (VITAMIN D3) 100000 UNIT/GM POWD Take by mouth.   Yes Historical Provider, MD  Coenzyme Q10 100 MG capsule Take 1 capsule by mouth daily.   Yes Historical Provider, MD  oxyCODONE-acetaminophen (PERCOCET/ROXICET) 5-325 MG tablet Take 1 tablet by mouth 3 times/day as needed-between meals & bedtime.  06/14/16  Yes Historical Provider, MD  sulfamethoxazole-trimethoprim (BACTRIM DS,SEPTRA DS) 800-160 MG tablet Take 1 tablet by mouth daily. 09/26/16  Yes [name], MD  thyroid (ARMOUR) 120 MG tablet Take 120 mg by mouth daily before breakfast.   Yes Historical Provider, MD  triamcinolone cream (KENALOG) 0.1 % Apply 1 application topically as needed.   Yes Historical Provider, MD  vitamin E 200 UNIT capsule Take 200 Units by mouth daily.   Yes Historical Provider, MD  ibuprofen (ADVIL,MOTRIN) 600 MG tablet Take 600 mg by mouth every 6 (six) hours as needed.    Historical Provider, MD      PHYSICAL EXAMINATION:   VITAL SIGNS: Blood pressure (!) 157/54, pulse 75, temperature 98 F (36.7 C), temperature source Oral, resp. rate 18, height 5\' 3"  (1.6 m), weight 49 kg (108 lb), SpO2 98 %.  GENERAL:  82 y.o.-year-old patient lying in the bed with no acute distress.  EYES: Pupils equal, round, reactive to light and accommodation. No scleral icterus. Extraocular muscles intact.  HEENT: Head atraumatic, normocephalic. Oropharynx and nasopharynx clear.  NECK:  Supple, no jugular venous distention. No thyroid enlargement, no tenderness.  LUNGS: Normal breath sounds bilaterally, no wheezing, rales,rhonchi or crepitation. No use of accessory muscles of respiration.  CARDIOVASCULAR: S1, S2 normal. No murmurs, rubs, or gallops. Pacemaker present on chest. ABDOMEN: Soft, nontender, nondistended. Bowel sounds  present. No organomegaly or mass.  EXTREMITIES: No pedal edema, cyanosis, or clubbing.  NEUROLOGIC: Cranial nerves II through XII are intact. Muscle strength 5/5 in all extremities. Sensation intact. Gait not checked.  PSYCHIATRIC: The patient is alert and oriented x 3.  SKIN: No obvious rash, lesion, or ulcer.   LABORATORY PANEL:   CBC  Recent Labs Lab 10/02/16 1037  WBC 5.1  HGB 10.9*  HCT 31.8*  PLT 229  MCV 87.3  MCH 29.9  MCHC 34.3  RDW 12.8   ------------------------------------------------------------------------------------------------------------------  Chemistries   Recent Labs Lab 09/26/16 1048 10/02/16 1037  NA 136 123*  K 4.9 4.2  CL 101 91*  CO2 26 23  GLUCOSE 86 113*  BUN 33* 23*  CREATININE 1.69* 1.90*  CALCIUM 9.3 9.3  AST  --  42*  ALT  --  28  ALKPHOS  --  60  BILITOT  --  0.4   ------------------------------------------------------------------------------------------------------------------ estimated creatinine clearance is 16.7 mL/min (by C-G formula based on SCr of 1.9 mg/dL (H)). ------------------------------------------------------------------------------------------------------------------ No results for input(s): TSH, T4TOTAL, T3FREE, THYROIDAB in the last 72 hours.  Invalid input(s): FREET3   Coagulation profile No results for input(s): INR, PROTIME in the last 168 hours. ------------------------------------------------------------------------------------------------------------------- No results for input(s): DDIMER in the last 72 hours. -------------------------------------------------------------------------------------------------------------------  Cardiac Enzymes  Recent Labs Lab 10/02/16 1037  TROPONINI <0.03   ------------------------------------------------------------------------------------------------------------------ Invalid input(s):  POCBNP  ---------------------------------------------------------------------------------------------------------------  Urinalysis    Component Value Date/Time   COLORURINE STRAW (A) 10/02/2016 1136   APPEARANCEUR CLEAR (A) 10/02/2016 1136   APPEARANCEUR Cloudy 12/26/2013 1622   LABSPEC 1.006 10/02/2016 1136   LABSPEC 1.011 12/26/2013 1622   PHURINE 7.0 10/02/2016 1136   GLUCOSEU NEGATIVE 10/02/2016 1136   GLUCOSEU Negative 12/26/2013 1622   HGBUR NEGATIVE 10/02/2016 1136   BILIRUBINUR NEGATIVE 10/02/2016 1136   BILIRUBINUR Negative 12/26/2013 1622   KETONESUR NEGATIVE 10/02/2016 1136   PROTEINUR NEGATIVE 10/02/2016 1136   NITRITE NEGATIVE 10/02/2016 1136   LEUKOCYTESUR NEGATIVE 10/02/2016 1136   LEUKOCYTESUR 3+ 12/26/2013 1622     RADIOLOGY: Dg Chest 2 View  Result Date: 10/02/2016 CLINICAL DATA:  Patient passed out today. EXAM: CHEST  2 VIEW COMPARISON:  Chest x-ray 12/24/2011.  CT scan 10/15/2013. FINDINGS: Hyperexpansion is consistent with emphysema. No focal airspace consolidation. No pulmonary edema or pleural effusion. Ill-defined small nodular density projects at the right lung base. The cardiopericardial silhouette is within normal limits for size. Right-sided permanent pacemaker noted. Bones are diffusely demineralized with evidence of lower thoracic vertebral augmentation. Telemetry leads overlie the chest. IMPRESSION: Emphysema with tiny nodular density right lung base more conspicuous than before. CT chest without contrast recommended to further evaluate. Electronically Signed   By: Misty Stanley M.D.   On: 10/02/2016 11:20    EKG: Orders placed or performed during the hospital encounter of 10/02/16  . ED EKG  . ED EKG    IMPRESSION AND PLAN:  * Symptomatic hyponatremia   IV fluid normal saline and recheck tomorrow.   Check urine osmolality and sodium excretion in urine.   Nephrology consult for further management.   Check TSH level.  * Syncopal episode    Most likely secondary to hyponatremia,   We will monitor on telemetry and if any abnormalities found and he may consider calling cardiology consult.   In that event she had a pacemaker and we can also do an interrogation on that.  * Recent UTI   UA is normal today, appropriately treated.  * Hypertension   Continue amlodipine, stable.  * Hypothyroidism   Continue levothyroxine, check TSH.  All the records are reviewed and case discussed with ED provider. Management plans discussed with the patient, family and they are in agreement.  CODE STATUS: full. Code Status History    Date Active Date Inactive Code Status Order ID Comments User Context   06/17/2015  1:03 PM 06/17/2015  5:36 PM Full Code [id number]  Hessie Knows, MD Inpatient       TOTAL TIME TAKING CARE OF THIS PATIENT: 50 minutes.    Vaughan Basta M.D on 10/02/2016   Between 7am to 6pm - Pager - [phone]  After 6pm go to [email or web address] - password [email or web address] [facility]  Office  [phone]  CC: Primary care physician; Idelle Crouch, MD   Note: This dictation was prepared with Dragon dictation along with smaller phrase technology. Any transcriptional errors that result from this process are unintentional.

## 2016-10-02 NOTE — ED Provider Notes (Signed)
[facility] Emergency Department Provider Note  Time seen: 11:34 AM  I have reviewed the triage vital signs and the nursing notes.   HISTORY  Chief Complaint Loss of Consciousness    HPI RAYLEEN WYRICK is a 82 y.o. female with a past medical history of hypertension, gastric reflux, recent diagnosis of urinary tract infection on antibiotics who presents to the emergency department after a syncopal episode. According to the husband they were eating rectus this morning when the patient stood up and began feeling very lightheaded, nauseated and vomited. Husband helped the patient down to the floor. It is unclear if the patient completely lost consciousness or not. Husband states he then got the patient up to her bed and laid her down. States she seemed confused for several minutes, and then was back to normal. He was concerned that she could be having a stroke or heart attack so brought her to the ER for evaluation. Upon arrival to the emergency department the patient is awake, alert, oriented, with no complaints. Denies nausea or chest pain. Denies headache, focal weakness or numbness. Patient states she has passed out before but it has been many years ago. Of note the patient's doctor recently switched her hypertensive medication less than one week ago.  Past Medical History:  Diagnosis Date  . Anemia   . Cervical cancer ([place])    Uterine Cancer  . Constipation   . GERD (gastroesophageal reflux disease)   . Glaucoma   . Hypertension   . Hypothyroidism   . Insomnia   . Spinal stenosis   . Thyroid disease     Patient Active Problem List   Diagnosis Date Noted  . Iron deficiency anemia due to chronic blood loss 05/23/2016  . Anemia due to other cause 05/16/2016    Past Surgical History:  Procedure Laterality Date  . ABDOMINAL HYSTERECTOMY    . BACK SURGERY    . COLONOSCOPY WITH PROPOFOL N/A 09/07/2016   Procedure: COLONOSCOPY WITH PROPOFOL;  Surgeon: Lollie Sails, MD;  Location: [facility] ENDOSCOPY;  Service: Endoscopy;  Laterality: N/A;  . ESOPHAGOGASTRODUODENOSCOPY (EGD) WITH PROPOFOL N/A 09/07/2016   Procedure: ESOPHAGOGASTRODUODENOSCOPY (EGD) WITH PROPOFOL;  Surgeon: Lollie Sails, MD;  Location: [facility] ENDOSCOPY;  Service: Endoscopy;  Laterality: N/A;  . HIP SURGERY    . JOINT REPLACEMENT     Hip Replacement  . KYPHOPLASTY N/A 06/17/2015   Procedure: KYPHOPLASTY;  Surgeon: Hessie Knows, MD;  Location: [facility] ORS;  Service: Orthopedics;  Laterality: N/A;  . PACEMAKER INSERTION    . SPINE SURGERY      Prior to Admission medications   Medication Sig Start Date End Date Taking? Authorizing Provider  amLODipine (NORVASC) 5 MG tablet Take 1 tablet (5 mg total) by mouth daily. 09/26/16   [name], MD  aspirin EC 81 MG tablet Take 1 tablet by mouth daily.    Historical Provider, MD  Cholecalciferol (VITAMIN D3) 100000 UNIT/GM POWD Take by mouth.    Historical Provider, MD  Coenzyme Q10 100 MG capsule Take 1 capsule by mouth daily.    Historical Provider, MD  ibuprofen (ADVIL,MOTRIN) 600 MG tablet Take 600 mg by mouth every 6 (six) hours as needed.    Historical Provider, MD  lansoprazole (PREVACID) 30 MG capsule Take 30 mg by mouth daily at 12 noon.    Historical Provider, MD  lisinopril (PRINIVIL,ZESTRIL) 5 MG tablet Take 5 mg by mouth daily.    Historical Provider, MD  Melatonin 3  MG TABS Take 1 tablet by mouth daily as needed.    Historical Provider, MD  oxyCODONE-acetaminophen (PERCOCET/ROXICET) 5-325 MG tablet Take 1 tablet by mouth 3 times/day as needed-between meals & bedtime.  06/14/16   Historical Provider, MD  sulfamethoxazole-trimethoprim (BACTRIM DS,SEPTRA DS) 800-160 MG tablet Take 1 tablet by mouth daily. 09/26/16   [name], MD  thyroid (ARMOUR) 120 MG tablet Take 120 mg by mouth daily before breakfast.    Historical Provider, MD  traZODone (DESYREL) 50 MG tablet Take 50 mg by mouth at bedtime.    Historical  Provider, MD  triamcinolone cream (KENALOG) 0.1 % Apply 1 application topically as needed.    Historical Provider, MD  vitamin E 200 UNIT capsule Take 200 Units by mouth daily.    Historical Provider, MD    Allergies  Allergen Reactions  . Codeine Anaphylaxis    Not Specified  . Brimonidine Tartrate Itching  . Fluoxetine Other (See Comments)    Reaction: Unknown  . Lodine [Etodolac]   . Morphine Itching  . Omeprazole Nausea And Vomiting  . Ciprofloxacin Rash  . Gabapentin Rash    Family History  Problem Relation Age of Onset  . Diabetes Mellitus II Mother   . Diabetes Mellitus II Father   . Hypertension Father     Social History Social History  Substance Use Topics  . Smoking status: Never Smoker  . Smokeless tobacco: Never Used  . Alcohol use No    Review of Systems Constitutional: Negative for fever. Cardiovascular: Negative for chest pain. Respiratory: Negative for shortness of breath. Gastrointestinal: Negative for abdominal pain. Positive for nausea and vomiting which has resolved. (Contrary to triage note which states vomiting for 3-4 days, patient states she has only vomited once and that was this morning during the event) Genitourinary: Negative for dysuria. Currently being treated for a UTI. Musculoskeletal: Negative for back pain. Neurological: Negative for headache 10-point ROS otherwise negative.  ____________________________________________   PHYSICAL EXAM:  VITAL SIGNS: ED Triage Vitals  Enc Vitals Group     BP 10/02/16 1033 (!) 166/53     Pulse Rate 10/02/16 1033 73     Resp 10/02/16 1033 12     Temp 10/02/16 1033 98 F (36.7 C)     Temp Source 10/02/16 1033 Oral     SpO2 10/02/16 1033 97 %     Weight 10/02/16 1033 108 lb (49 kg)     Height 10/02/16 1033 5\' 3"  (1.6 m)     Head Circumference --      Peak Flow --      Pain Score 10/02/16 1034 0     Pain Loc --      Pain Edu? --      Excl. in [place]? --     Constitutional: Alert and  oriented. Well appearing and in no distress. Eyes: Normal exam ENT   Head: Normocephalic and atraumatic.   Mouth/Throat: Mucous membranes are moist. Cardiovascular: Normal rate, regular rhythm. No murmur Respiratory: Normal respiratory effort without tachypnea nor retractions. Breath sounds are clear Gastrointestinal: Soft and nontender. No distention.   Musculoskeletal: Nontender with normal range of motion in all extremities.  Neurologic:  Normal speech and language. No gross focal neurologic deficits Skin:  Skin is warm, dry and intact.  Psychiatric: Mood and affect are normal.   ____________________________________________    EKG  EKG reviewed and interpreted, so shows normal sinus rhythm at 70 bpm, widened QRS, slightly prolonged QTC, nonspecific ST changes, morphology  most consistent with left bundle branch block.  ____________________________________________    RADIOLOGY  Chest x-ray shows emphysema with tiny nodular density in the right lung base.  ____________________________________________   INITIAL IMPRESSION / ASSESSMENT AND PLAN / ED COURSE  Pertinent labs & imaging results that were available during my care of the patient were reviewed by me and considered in my medical decision making (see chart for details).  Patient presents to the emergency Department after syncopal first near syncopal episode. Patient's symptoms appeared to suggest orthostatic hypotension. Patient had a pacemaker placed 2 years ago. We will closely monitor in the emergency department, obtain lab work including 2 sets of troponins. EKG appears to show a left bundle branch block. Patient has a normal physical examination with a nontender abdomen. Overall she appears very well.  Labs have resulted with a sodium of 123. Given a syncopal episode with hyponatremia we will admit to the hospital for further treatment.  ____________________________________________   FINAL CLINICAL  IMPRESSION(S) / ED DIAGNOSES  Syncope Hyponatremia   Harvest Dark, MD 10/02/16 1141

## 2016-10-02 NOTE — ED Triage Notes (Signed)
Pt arrived via EMS from home for reports of witness syncopal episode today. Pt has been antibiotics for UTI and has been vomiting for 3-4 days. Pt reports getting up to bathroom and having syncopal episode with husband present and assisting her to floor. Denies head injury. EMS reports 154/88, 97.9 oral, 75 HR, CBG 124. EMS gave ASA 243 mg PO and Zofran 16m IV

## 2016-10-02 NOTE — Consult Note (Signed)
Reason for Consult:syncope HypoNa Referring Physician: Hospitalist Dr Elisabeth Pigeon, Dr Judithann Sheen primary  Courtney Manning is an 82 y.o. female.  HPI: Pt reported recently while at home at the table eating. The pt got up and then had a episode of LOC . She also had AMS for a while with confusion. Sh ec/o of fatigue but no chest pain . There was some SOB nausea and mild vomitting . She feels well now. Pacer was checked a few weeks ago at home. PPM olacement was about 2 years ago at [facility] for pauses. She has a Engineer, agricultural PPM.   Past Medical History:  Diagnosis Date  . Anemia   . Cervical cancer (HCC)    Uterine Cancer  . Constipation   . GERD (gastroesophageal reflux disease)   . Glaucoma   . Hypertension   . Hypothyroidism   . Insomnia   . Spinal stenosis   . Thyroid disease     Past Surgical History:  Procedure Laterality Date  . ABDOMINAL HYSTERECTOMY    . BACK SURGERY    . COLONOSCOPY WITH PROPOFOL N/A 09/07/2016   Procedure: COLONOSCOPY WITH PROPOFOL;  Surgeon: Christena Deem, MD;  Location: [facility] ENDOSCOPY;  Service: Endoscopy;  Laterality: N/A;  . ESOPHAGOGASTRODUODENOSCOPY (EGD) WITH PROPOFOL N/A 09/07/2016   Procedure: ESOPHAGOGASTRODUODENOSCOPY (EGD) WITH PROPOFOL;  Surgeon: Christena Deem, MD;  Location: [facility] ENDOSCOPY;  Service: Endoscopy;  Laterality: N/A;  . HIP SURGERY    . JOINT REPLACEMENT     Hip Replacement  . KYPHOPLASTY N/A 06/17/2015   Procedure: KYPHOPLASTY;  Surgeon: Kennedy Bucker, MD;  Location: [facility] ORS;  Service: Orthopedics;  Laterality: N/A;  . PACEMAKER INSERTION    . SPINE SURGERY      Family History  Problem Relation Age of Onset  . Diabetes Mellitus II Mother   . Diabetes Mellitus II Father   . Hypertension Father     Social History:  reports that she has never smoked. She has never used smokeless tobacco. She reports that she does not drink alcohol or use drugs.  Allergies:  Allergies  Allergen Reactions  . Codeine Anaphylaxis    Not  Specified  . Brimonidine Tartrate Itching  . Fluoxetine Other (See Comments)    Reaction: Unknown  . Lodine [Etodolac]   . Morphine Itching  . Omeprazole Nausea And Vomiting  . Ciprofloxacin Rash  . Gabapentin Rash    Medications: I have reviewed the patient's current medications.  Results for orders placed or performed during the hospital encounter of 10/02/16 (from the past 48 hour(s))  CBC     Status: Abnormal   Collection Time: 10/02/16 10:37 AM  Result Value Ref Range   WBC 5.1 3.6 - 11.0 K/uL   RBC 3.64 (L) 3.80 - 5.20 MIL/uL   Hemoglobin 10.9 (L) 12.0 - 16.0 g/dL   HCT 58.4 (L) 83.5 - 07.5 %   MCV 87.3 80.0 - 100.0 fL   MCH 29.9 26.0 - 34.0 pg   MCHC 34.3 32.0 - 36.0 g/dL   RDW 73.2 25.6 - 72.0 %   Platelets 229 150 - 440 K/uL  Comprehensive metabolic panel     Status: Abnormal   Collection Time: 10/02/16 10:37 AM  Result Value Ref Range   Sodium 123 (L) 135 - 145 mmol/L   Potassium 4.2 3.5 - 5.1 mmol/L   Chloride 91 (L) 101 - 111 mmol/L   CO2 23 22 - 32 mmol/L   Glucose, Bld 113 (H) 65 - 99 mg/dL  BUN 23 (H) 6 - 20 mg/dL   Creatinine, Ser 1.90 (H) 0.44 - 1.00 mg/dL   Calcium 9.3 8.9 - 10.3 mg/dL   Total Protein 6.8 6.5 - 8.1 g/dL   Albumin 3.9 3.5 - 5.0 g/dL   AST 42 (H) 15 - 41 U/L   ALT 28 14 - 54 U/L   Alkaline Phosphatase 60 38 - 126 U/L   Total Bilirubin 0.4 0.3 - 1.2 mg/dL   GFR calc non Af Amer 23 (L) >60 mL/min   GFR calc Af Amer 27 (L) >60 mL/min    Comment: (NOTE) The eGFR has been calculated using the CKD EPI equation. This calculation has not been validated in all clinical situations. eGFR's persistently <60 mL/min signify possible Chronic Kidney Disease.    Anion gap 9 5 - 15  Troponin I     Status: None   Collection Time: 10/02/16 10:37 AM  Result Value Ref Range   Troponin I <0.03 <0.03 ng/mL  TSH     Status: None   Collection Time: 10/02/16 10:37 AM  Result Value Ref Range   TSH 0.939 0.350 - 4.500 uIU/mL    Comment: Performed by a  3rd Generation assay with a functional sensitivity of <=0.01 uIU/mL.  Urinalysis complete, with microscopic ([facility] only)     Status: Abnormal   Collection Time: 10/02/16 11:36 AM  Result Value Ref Range   Color, Urine STRAW (A) YELLOW   APPearance CLEAR (A) CLEAR   Glucose, UA NEGATIVE NEGATIVE mg/dL   Bilirubin Urine NEGATIVE NEGATIVE   Ketones, ur NEGATIVE NEGATIVE mg/dL   Specific Gravity, Urine 1.006 1.005 - 1.030   Hgb urine dipstick NEGATIVE NEGATIVE   pH 7.0 5.0 - 8.0   Protein, ur NEGATIVE NEGATIVE mg/dL   Nitrite NEGATIVE NEGATIVE   Leukocytes, UA NEGATIVE NEGATIVE   RBC / HPF 0-5 0 - 5 RBC/hpf   WBC, UA 0-5 0 - 5 WBC/hpf   Bacteria, UA NONE SEEN NONE SEEN   Squamous Epithelial / LPF NONE SEEN NONE SEEN  Osmolality, urine     Status: Abnormal   Collection Time: 10/02/16 11:36 AM  Result Value Ref Range   Osmolality, Ur 227 (L) 300 - 900 mOsm/kg  Sodium, urine, random     Status: None   Collection Time: 10/02/16 11:36 AM  Result Value Ref Range   Sodium, Ur 47 mmol/L    Dg Chest 2 View  Result Date: 10/02/2016 CLINICAL DATA:  Patient passed out today. EXAM: CHEST  2 VIEW COMPARISON:  Chest x-ray 12/24/2011.  CT scan 10/15/2013. FINDINGS: Hyperexpansion is consistent with emphysema. No focal airspace consolidation. No pulmonary edema or pleural effusion. Ill-defined small nodular density projects at the right lung base. The cardiopericardial silhouette is within normal limits for size. Right-sided permanent pacemaker noted. Bones are diffusely demineralized with evidence of lower thoracic vertebral augmentation. Telemetry leads overlie the chest. IMPRESSION: Emphysema with tiny nodular density right lung base more conspicuous than before. CT chest without contrast recommended to further evaluate. Electronically Signed   By: Misty Stanley M.D.   On: 10/02/2016 11:20    Review of Systems  Constitutional: Positive for malaise/fatigue.  HENT: Negative.   Eyes: Negative.    Respiratory: Negative.   Cardiovascular: Negative.   Gastrointestinal: Negative.   Genitourinary: Negative.   Musculoskeletal: Negative.   Skin: Negative.   Neurological: Positive for dizziness, loss of consciousness and weakness.  Endo/Heme/Allergies: Negative.   Psychiatric/Behavioral: Negative.    Blood pressure (!) 181/51,  pulse 83, temperature 98 F (36.7 C), temperature source Oral, resp. rate 20, height '5\' 3"'$  (1.6 m), weight 49 kg (108 lb), SpO2 100 %. Physical Exam  Nursing note and vitals reviewed. Constitutional: She is oriented to person, place, and time. She appears well-developed and well-nourished.  HENT:  Head: Normocephalic and atraumatic.  Right Ear: Decreased hearing is noted.  Left Ear: Decreased hearing is noted.  Eyes: Conjunctivae and EOM are normal. Pupils are equal, round, and reactive to light.  Neck: Normal range of motion. Neck supple.  Cardiovascular: Normal rate and regular rhythm.   Respiratory: Effort normal and breath sounds normal.  GI: Soft. Bowel sounds are normal.  Musculoskeletal: Normal range of motion.  Neurological: She is alert and oriented to person, place, and time. She has normal reflexes.  Skin: Skin is warm and dry.  Psychiatric: She has a normal mood and affect.    Assessment/Plan: Syncope Hyponatremia Anemia SSS HTN CRI GERD . Agree with admit Correct electrolytes Continue EKG f/u Rec pacemaker interrogation Mild hydration F/U with nephrology for CRI Check for orthostatic changes Continue thyroid therapy Consider neurology input F/U withcardiology as outpt   Dwayne D Callwood 10/02/2016, 8:43 PM

## 2016-10-02 NOTE — Progress Notes (Signed)
PHARMACIST - PHYSICIAN ORDER COMMUNICATION  CONCERNING: P&T Medication Policy on Herbal Medications  DESCRIPTION:  This patient's order for:  CoQ10 100 mg  has been noted.  This product(s) is classified as an "herbal" or natural product. Due to a lack of definitive safety studies or FDA approval, nonstandard manufacturing practices, plus the potential risk of unknown drug-drug interactions while on inpatient medications, the Pharmacy and Therapeutics Committee does not permit the use of "herbal" or natural products of this type within [place].   ACTION TAKEN: The pharmacy department is unable to verify this order at this time and your patient has been informed of this safety policy. Please reevaluate patient's clinical condition at discharge and address if the herbal or natural product(s) should be resumed at that time.  

## 2016-10-03 LAB — BASIC METABOLIC PANEL
ANION GAP: 4 — AB (ref 5–15)
BUN: 18 mg/dL (ref 6–20)
CALCIUM: 8.6 mg/dL — AB (ref 8.9–10.3)
CHLORIDE: 107 mmol/L (ref 101–111)
CO2: 21 mmol/L — AB (ref 22–32)
CREATININE: 1.7 mg/dL — AB (ref 0.44–1.00)
GFR calc non Af Amer: 26 mL/min — ABNORMAL LOW (ref >60)
GFR, EST AFRICAN AMERICAN: 30 mL/min — AB (ref >60)
GLUCOSE: 79 mg/dL (ref 65–99)
Potassium: 4.7 mmol/L (ref 3.5–5.1)
Sodium: 132 mmol/L — ABNORMAL LOW (ref 135–145)

## 2016-10-03 LAB — CBC
HCT: 27.2 % — ABNORMAL LOW (ref 35.0–47.0)
HEMOGLOBIN: 9.5 g/dL — AB (ref 12.0–16.0)
MCH: 30.4 pg (ref 26.0–34.0)
MCHC: 34.8 g/dL (ref 32.0–36.0)
MCV: 87.4 fL (ref 80.0–100.0)
PLATELETS: 212 10*3/uL (ref 150–440)
RBC: 3.11 MIL/uL — AB (ref 3.80–5.20)
RDW: 12.9 % (ref 11.5–14.5)
WBC: 5.8 10*3/uL (ref 3.6–11.0)

## 2016-10-03 NOTE — Care Management Important Message (Signed)
Important Message  Patient Details  Name: Courtney Manning MRN: [id number] Date of Birth: 28-May-1929   Medicare Important Message Given:  N/A - LOS <3 / Initial given by admissions    Beverly Sessions, RN 10/03/2016, 2:08 PM

## 2016-10-03 NOTE — Discharge Instructions (Signed)
Syncope Introduction Syncope is when you lose temporarily pass out (faint). Signs that you may be about to pass out include:  Feeling dizzy or light-headed.  Feeling sick to your stomach (nauseous).  Seeing all white or all black.  Having cold, clammy skin. If you passed out, get help right away. Call your local emergency services (911 in the [place] Do not drive yourself to the hospital. Follow these instructions at home: Pay attention to any changes in your symptoms. Take these actions to help with your condition:  Have someone stay with you until you feel stable.  Do not drive, use machinery, or play sports until your doctor says it is okay.  Keep all follow-up visits as told by your doctor. This is important.  If you start to feel like you might pass out, lie down right away and raise (elevate) your feet above the level of your heart. Breathe deeply and steadily. Wait until all of the symptoms are gone.  Drink enough fluid to keep your pee (urine) clear or pale yellow.  If you are taking blood pressure or heart medicine, get up slowly and spend many minutes getting ready to sit and then stand. This can help with dizziness.  Take over-the-counter and prescription medicines only as told by your doctor. Get help right away if:  You have a very bad headache.  You have unusual pain in your chest, tummy, or back.  You are bleeding from your mouth or rectum.  You have black or tarry poop (stool).  You have a very fast or uneven heartbeat (palpitations).  It hurts to breathe.  You pass out once or more than once.  You have jerky movements that you cannot control (seizure).  You are confused.  You have trouble walking.  You are very weak.  You have vision problems. These symptoms may be an emergency. Do not wait to see if the symptoms will go away. Get medical help right away. Call your local emergency services (911 in the [place] Do not drive yourself to the hospital.    This information is not intended to replace advice given to you by your health care provider. Make sure you discuss any questions you have with your health care provider. Document Released: 04/03/2008 Document Revised: 03/23/2016 Document Reviewed: 06/30/2015  2017 Elsevier

## 2016-10-03 NOTE — Consult Note (Signed)
[facility]  CONSULT NOTE    Date: 10/03/2016                  Patient Name:  Courtney Manning  MRN: [id number]  DOB: 05/26/1929  Age / Sex: 82 y.o., female         PCP: Idelle Crouch, MD                 Service Requesting Consult: Dr. Posey Pronto                 Reason for Consult: Acute renal failure, hyponatremia            History of Present Illness: Courtney Manning is a 82 y.o. white female with cervical cancer, anemia, GERD, glaucoma, hypertension, hypothyroidism, spinal stenosis, pacemaker, who was admitted to [facility] on 10/02/2016 for Hyponatremia [E87.1] Near syncope [R55]   She states she recently had a UTI and was treated with bactrim. She had presyncopal episode. She was found to have a serum sodium of 123 and creatinine of 1.9 which is above her baseline of 1.5. She was started on IV fluids: NS at 75. She states she feels much better this morning. Not on diuretics at home.    Medications: Outpatient medications: Prescriptions Prior to Admission  Medication Sig Dispense Refill Last Dose  . amLODipine (NORVASC) 5 MG tablet Take 1 tablet (5 mg total) by mouth daily. 30 tablet 3 10/01/2016 at 0800  . aspirin EC 81 MG tablet Take 1 tablet by mouth daily.   10/02/2016 at 0800  . Cholecalciferol (VITAMIN D3) 100000 UNIT/GM POWD Take by mouth.   10/01/2016 at 0800  . Coenzyme Q10 100 MG capsule Take 1 capsule by mouth daily.   Past Month at 0800  . oxyCODONE-acetaminophen (PERCOCET/ROXICET) 5-325 MG tablet Take 1 tablet by mouth 3 times/day as needed-between meals & bedtime.    10/01/2016 at 2100  . sulfamethoxazole-trimethoprim (BACTRIM DS,SEPTRA DS) 800-160 MG tablet Take 1 tablet by mouth daily. 10 tablet 0 10/01/2016 at 0800  . thyroid (ARMOUR) 120 MG tablet Take 120 mg by mouth daily before breakfast.   10/02/2016 at 0800  . triamcinolone cream (KENALOG) 0.1 % Apply 1 application topically as needed.   prn at prn  . vitamin E 200 UNIT capsule Take 200 Units by  mouth daily.   10/01/2016 at 0800  . ibuprofen (ADVIL,MOTRIN) 600 MG tablet Take 600 mg by mouth every 6 (six) hours as needed.   prn at prn    Current medications: Current Facility-Administered Medications  Medication Dose Route Frequency Provider Last Rate Last Dose  . amLODipine (NORVASC) tablet 5 mg  5 mg Oral Daily Vaughan Basta, MD   5 mg at 10/03/16 0851  . aspirin EC tablet 81 mg  81 mg Oral Daily Vaughan Basta, MD   81 mg at 10/03/16 0852  . heparin injection 5,000 Units  5,000 Units Subcutaneous Q8H Vaughan Basta, MD   5,000 Units at 10/03/16 0559  . oxyCODONE-acetaminophen (PERCOCET/ROXICET) 5-325 MG per tablet 1 tablet  1 tablet Oral Q8H PRN Vaughan Basta, MD   1 tablet at 10/03/16 0205  . thyroid (ARMOUR) tablet 120 mg  120 mg Oral QAC breakfast Vaughan Basta, MD   120 mg at 10/03/16 0851      Allergies: Allergies  Allergen Reactions  . Codeine Anaphylaxis    Not Specified  . Brimonidine Tartrate Itching  . Fluoxetine Other (See Comments)    Reaction: Unknown  .  Lodine [Etodolac]   . Morphine Itching  . Omeprazole Nausea And Vomiting  . Ciprofloxacin Rash  . Gabapentin Rash      Past Medical History: Past Medical History:  Diagnosis Date  . Anemia   . Cervical cancer ([place])    Uterine Cancer  . Constipation   . GERD (gastroesophageal reflux disease)   . Glaucoma   . Hypertension   . Hypothyroidism   . Insomnia   . Spinal stenosis   . Thyroid disease      Past Surgical History: Past Surgical History:  Procedure Laterality Date  . ABDOMINAL HYSTERECTOMY    . BACK SURGERY    . COLONOSCOPY WITH PROPOFOL N/A 09/07/2016   Procedure: COLONOSCOPY WITH PROPOFOL;  Surgeon: Lollie Sails, MD;  Location: [facility] ENDOSCOPY;  Service: Endoscopy;  Laterality: N/A;  . ESOPHAGOGASTRODUODENOSCOPY (EGD) WITH PROPOFOL N/A 09/07/2016   Procedure: ESOPHAGOGASTRODUODENOSCOPY (EGD) WITH PROPOFOL;  Surgeon: Lollie Sails, MD;   Location: [facility] ENDOSCOPY;  Service: Endoscopy;  Laterality: N/A;  . HIP SURGERY    . JOINT REPLACEMENT     Hip Replacement  . KYPHOPLASTY N/A 06/17/2015   Procedure: KYPHOPLASTY;  Surgeon: Hessie Knows, MD;  Location: [facility] ORS;  Service: Orthopedics;  Laterality: N/A;  . PACEMAKER INSERTION    . SPINE SURGERY       Family History: Family History  Problem Relation Age of Onset  . Diabetes Mellitus II Mother   . Diabetes Mellitus II Father   . Hypertension Father      Social History: Social History   Social History  . Marital status: Married    Spouse name: N/A  . Number of children: N/A  . Years of education: N/A   Occupational History  . Not on file.   Social History Main Topics  . Smoking status: Never Smoker  . Smokeless tobacco: Never Used  . Alcohol use No  . Drug use: No  . Sexual activity: Not on file   Other Topics Concern  . Not on file   Social History Narrative  . No narrative on file     Review of Systems: Review of Systems  Constitutional: Negative.  Negative for chills, diaphoresis, fever, malaise/fatigue and weight loss.  HENT: Negative.  Negative for congestion, ear discharge, ear pain, hearing loss, nosebleeds, sinus pain, sore throat and tinnitus.   Eyes: Negative.  Negative for blurred vision, double vision, photophobia, pain, discharge and redness.  Respiratory: Negative.  Negative for cough, hemoptysis, sputum production, shortness of breath, wheezing and stridor.   Cardiovascular: Negative.  Negative for chest pain, palpitations, orthopnea, claudication, leg swelling and PND.  Gastrointestinal: Negative.  Negative for abdominal pain, blood in stool, constipation, diarrhea, heartburn, melena, nausea and vomiting.  Genitourinary: Negative.  Negative for dysuria, flank pain, frequency, hematuria and urgency.  Musculoskeletal: Negative.  Negative for back pain, falls, joint pain, myalgias and neck pain.  Skin: Negative.  Negative for itching  and rash.  Neurological: Negative.  Negative for dizziness, tingling, tremors, sensory change, speech change, focal weakness, seizures, loss of consciousness, weakness and headaches.  Endo/Heme/Allergies: Negative.  Negative for environmental allergies and polydipsia. Does not bruise/bleed easily.  Psychiatric/Behavioral: Negative.  Negative for depression, hallucinations, memory loss, substance abuse and suicidal ideas. The patient is not nervous/anxious and does not have insomnia.     Vital Signs: Blood pressure (!) 140/40, pulse 74, temperature 97.5 F (36.4 C), temperature source Oral, resp. rate 20, height '5\' 3"'$  (1.6 m), weight 49 kg (108 lb), SpO2  97 %.  Weight trends: Filed Weights   10/02/16 1033  Weight: 49 kg (108 lb)    Physical Exam: General: NAD, sitting in chair  Head: Normocephalic, atraumatic. Moist oral mucosal membranes  Eyes: Anicteric, PERRL  Neck: Supple, trachea midline  Lungs:  Clear to auscultation  Heart: Regular rate and rhythm  Abdomen:  Soft, nontender,   Extremities:  no peripheral edema.  Neurologic: Nonfocal, moving all four extremities  Skin: No lesions        Lab results: Basic Metabolic Panel:  Recent Labs Lab 10/02/16 1037 10/03/16 0437  NA 123* 132*  K 4.2 4.7  CL 91* 107  CO2 23 21*  GLUCOSE 113* 79  BUN 23* 18  CREATININE 1.90* 1.70*  CALCIUM 9.3 8.6*    Liver Function Tests:  Recent Labs Lab 10/02/16 1037  AST 42*  ALT 28  ALKPHOS 60  BILITOT 0.4  PROT 6.8  ALBUMIN 3.9   No results for input(s): LIPASE, AMYLASE in the last 168 hours. No results for input(s): AMMONIA in the last 168 hours.  CBC:  Recent Labs Lab 10/02/16 1037 10/03/16 0437  WBC 5.1 5.8  HGB 10.9* 9.5*  HCT 31.8* 27.2*  MCV 87.3 87.4  PLT 229 212    Cardiac Enzymes:  Recent Labs Lab 10/02/16 1037  TROPONINI <0.03    BNP: Invalid input(s): POCBNP  CBG: No results for input(s): GLUCAP in the last 168  hours.  Microbiology: Results for orders placed or performed in visit on 09/26/16  Urine culture     Status: Abnormal   Collection Time: 09/26/16 11:15 AM  Result Value Ref Range Status   Specimen Description URINE, RANDOM  Final   Special Requests NONE  Final   Culture >=100,000 COLONIES/mL ESCHERICHIA COLI (A)  Final   Report Status 09/28/2016 FINAL  Final   Organism ID, Bacteria ESCHERICHIA COLI (A)  Final      Susceptibility   Escherichia coli - MIC*    AMPICILLIN 4 SENSITIVE Sensitive     CEFAZOLIN <=4 SENSITIVE Sensitive     CEFTRIAXONE <=1 SENSITIVE Sensitive     CIPROFLOXACIN <=0.25 SENSITIVE Sensitive     GENTAMICIN <=1 SENSITIVE Sensitive     IMIPENEM <=0.25 SENSITIVE Sensitive     NITROFURANTOIN <=16 SENSITIVE Sensitive     TRIMETH/SULFA <=20 SENSITIVE Sensitive     AMPICILLIN/SULBACTAM <=2 SENSITIVE Sensitive     PIP/TAZO <=4 SENSITIVE Sensitive     Extended ESBL NEGATIVE Sensitive     * >=100,000 COLONIES/mL ESCHERICHIA COLI    Coagulation Studies: No results for input(s): LABPROT, INR in the last 72 hours.  Urinalysis:  Recent Labs  10/02/16 1136  COLORURINE STRAW*  LABSPEC 1.006  PHURINE 7.0  GLUCOSEU NEGATIVE  HGBUR NEGATIVE  BILIRUBINUR NEGATIVE  KETONESUR NEGATIVE  PROTEINUR NEGATIVE  NITRITE NEGATIVE  LEUKOCYTESUR NEGATIVE      Imaging: Dg Chest 2 View  Result Date: 10/02/2016 CLINICAL DATA:  Patient passed out today. EXAM: CHEST  2 VIEW COMPARISON:  Chest x-ray 12/24/2011.  CT scan 10/15/2013. FINDINGS: Hyperexpansion is consistent with emphysema. No focal airspace consolidation. No pulmonary edema or pleural effusion. Ill-defined small nodular density projects at the right lung base. The cardiopericardial silhouette is within normal limits for size. Right-sided permanent pacemaker noted. Bones are diffusely demineralized with evidence of lower thoracic vertebral augmentation. Telemetry leads overlie the chest. IMPRESSION: Emphysema with tiny  nodular density right lung base more conspicuous than before. CT chest without contrast recommended to further evaluate. Electronically  Signed   By: Misty Stanley M.D.   On: 10/02/2016 11:20      Assessment & Plan: Ms. Courtney Manning is a 82 y.o. white female with cervical cancer, anemia, GERD, glaucoma, hypertension, hypothyroidism, spinal stenosis, pacemaker, who was admitted to [facility] on 10/02/2016 for Hyponatremia [E87.1] Near syncope [R55]   1. Acute Renal Failure on chronic kidney disease stage III: baseline creatinine of 1.5, eGFR of 33 on 07/25/16.  Acute renal failure seems to be secondary to prerenal azotemia, urinary tract infection and bactrim Chronic kidney disease stage III secondary to NSAIDs and hypertension.  - now patient is euvolemic: recommend discontinuation of IV fluids.  - discontinue ibuprofen  2. Hyponatremia: hypovolemic. Some history of chronic hyponatremia from chronic kidney disease. Improved with IV saline - may need salt tabs and volume restriction on discharge.   3. Hypertension: blood pressure at goal.  - restarted on amlodipine.   4. Anemia with chronic kidney disease: may need further work up.    LOS: 1 Courtney Manning 12/5/201711:52 AM

## 2016-10-03 NOTE — Care Management (Signed)
Patient lives at home with husband and son.  RW and cane in the home for ambulation if needed.  PCP [facility].  Pharmacy [facility].  Denies any issues with transportation or obtaining medications.  PT has assessed patient and recommends home health.  Patient declines home health services at discharge.  RNCM signing off .

## 2016-10-03 NOTE — Evaluation (Signed)
Physical Therapy Evaluation Patient Details Name: Courtney Manning MRN: [id number] DOB: 1929-01-15 Today's Date: 10/03/2016   History of Present Illness  82 yo female with onset of UTI wiht hyponatremia, emphysema, osteoporosis was admitted.  PMHx:  spinal stenosis, thyroid disease, pacemaker,  anemia, cervical CA, glaucoma, GERD, HTN  Clinical Impression  Pt is up to walk with PT and noted her motivation and somewhat limited awareness of how weak she is.  Will recommend HHPT follow her and will have pt potentially continue to outpatient therapy to get her balance and endurance back for the maximum safety at home.  Follow acutely as well for same goals with gait, strengthening and balance.    Follow Up Recommendations Home health PT;Supervision for mobility/OOB    Equipment Recommendations  Rolling walker with 5" wheels (if hers is in disrepair)    Recommendations for Other Services       Precautions / Restrictions Precautions Precautions: Fall Restrictions Weight Bearing Restrictions: No      Mobility  Bed Mobility Overal bed mobility: Needs Assistance Bed Mobility: Supine to Sit     Supine to sit: Min assist     General bed mobility comments: help to sit up to side of bed with trunk  Transfers Overall transfer level: Needs assistance Equipment used: Rolling walker (2 wheeled);1 person hand held assist Transfers: Sit to/from [facility] Sit to Stand: Min guard;Min assist Stand pivot transfers: Min guard;Min assist       General transfer comment: cues for help to sequence and for safety  Ambulation/Gait Ambulation/Gait assistance: Min assist (dense cues for direction of walker) Ambulation Distance (Feet): 45 Feet Assistive device: Rolling walker (2 wheeled);1 person hand held assist Gait Pattern/deviations: Step-through pattern;Decreased stride length;Narrow base of support;Trunk flexed;Shuffle Gait velocity: redcued Gait velocity interpretation:  Below normal speed for age/gender General Gait Details: fatigued quickly and declined to go out of the room  Stairs            Wheelchair Mobility    Modified Rankin (Stroke Patients Only)       Balance Overall balance assessment: Needs assistance;History of Falls (syncopal episode at home) Sitting-balance support: Bilateral upper extremity supported;Feet supported Sitting balance-Leahy Scale: Fair (able to sit on commode in BR)       Standing balance-Leahy Scale: Poor                               Pertinent Vitals/Pain Pain Assessment: No/denies pain    Home Living Family/patient expects to be discharged to:: Private residence Living Arrangements: Spouse/significant other;Children Available Help at Discharge: Family;Available 24 hours/day Type of Home: House Home Access: Stairs to enter Entrance Stairs-Rails: None Entrance Stairs-Number of Steps: 2 Home Layout: One level Home Equipment: Walker - 2 wheels;Cane - single point Additional Comments: Pt reports usually being on cane but has walker there    Prior Function Level of Independence: Independent with assistive device(s)               Hand Dominance        Extremity/Trunk Assessment   Upper Extremity Assessment: Overall WFL for tasks assessed           Lower Extremity Assessment: Generalized weakness      Cervical / Trunk Assessment: Kyphotic  Communication   Communication: No difficulties  Cognition Arousal/Alertness: Awake/alert Behavior During Therapy: WFL for tasks assessed/performed Overall Cognitive Status: Within Functional Limits for tasks assessed  General Comments General comments (skin integrity, edema, etc.): Pt is expecting to get home from hospital directly to the house, have been able to assist her but she is going to need hands on help with all mobility    Exercises General Exercises - Lower Extremity Ankle Circles/Pumps:  AROM;10 reps;Both Quad Sets: AROM;Both;10 reps Hip ABduction/ADduction: AROM;Both;10 reps   Assessment/Plan    PT Assessment Patient needs continued PT services  PT Problem List Decreased strength;Decreased range of motion;Decreased activity tolerance;Decreased mobility;Decreased balance;Decreased coordination;Decreased knowledge of use of DME;Decreased safety awareness          PT Treatment Interventions DME instruction;Stair training;Gait training;Functional mobility training;Therapeutic activities;Therapeutic exercise;Balance training;Neuromuscular re-education;Patient/family education    PT Goals (Current goals can be found in the Care Plan section)  Acute Rehab PT Goals Patient Stated Goal: to get home with hsuband PT Goal Formulation: With patient Time For Goal Achievement: 10/17/16 Potential to Achieve Goals: Good    Frequency Min 2X/week   Barriers to discharge Decreased caregiver support (husband helps her alone)      Co-evaluation               End of Session Equipment Utilized During Treatment: Gait belt Activity Tolerance: Patient tolerated treatment well;Patient limited by fatigue Patient left: in chair;with call bell/phone within reach Nurse Communication: Mobility status         Time: 6578-4696 PT Time Calculation (min) (ACUTE ONLY): 32 min   Charges:   PT Evaluation $PT Eval Moderate Complexity: 1 Procedure PT Treatments $Gait Training: 8-22 mins   PT G CodesRamond Dial 2016-10-12, 12:51 PM    Mee Hives, PT MS Acute Rehab Dept. Number: [place] [facility] [place]

## 2016-10-03 NOTE — Discharge Summary (Signed)
Courtney Manning at [place] NAME: Courtney Manning    MR#:  [id number]  DATE OF BIRTH:  01-30-1929  DATE OF ADMISSION:  10/02/2016 ADMITTING PHYSICIAN: Courtney Basta, MD  DATE OF DISCHARGE: 10/03/16  PRIMARY CARE PHYSICIAN: SPARKS,JEFFREY D, MD    ADMISSION DIAGNOSIS:  Hyponatremia [E87.1] Near syncope [R55]  DISCHARGE DIAGNOSIS:  Hyponatremia due to pre-renal azotemia-improved Acute on chronic renal failure-improvng  SECONDARY DIAGNOSIS:   Past Medical History:  Diagnosis Date  . Anemia   . Cervical cancer (Grandfather)    Uterine Cancer  . Constipation   . GERD (gastroesophageal reflux disease)   . Glaucoma   . Hypertension   . Hypothyroidism   . Insomnia   . Spinal stenosis   . Thyroid disease     HOSPITAL COURSE:   [name]  is a 82 y.o. female with a known history of Cervical cancer, glaucoma, gastroesophageal reflux disease, hypertension, hypothyroidism, thyroid disease- lives with husband and independent in her day-to-day activity, healthy use a cane to move around. 2 weeks ago she was diagnosed having UTI and was treated with Bactrim. Today morning while she was having her breakfast she suddenly felt syncopal episode  * Symptomatic hyponatremia improving   recieved IV fluid normal saline -came in with NA 123---132   Nephrology consult appreciated appears dehydration  TSH level. ok  * Syncopal episode due to pre-renal azotemia   Most likely secondary to hyponatremia,   * Recent UTI treated recently with bactrim   UA is normal today, appropriately treated.  * Hypertension   Continue amlodipine, stable.  * Hypothyroidism   Continue levothyroxine -TSH ok  Overall better Pt is independent and uses cane at home  D/c home. She is agreeable   CONSULTS OBTAINED:  Treatment Team:  Yolonda Kida, MD Lavonia Dana, MD  DRUG ALLERGIES:   Allergies  Allergen Reactions  . Codeine Anaphylaxis    Not  Specified  . Brimonidine Tartrate Itching  . Fluoxetine Other (See Comments)    Reaction: Unknown  . Lodine [Etodolac]   . Morphine Itching  . Omeprazole Nausea And Vomiting  . Ciprofloxacin Rash  . Gabapentin Rash    DISCHARGE MEDICATIONS:   Current Discharge Medication List    CONTINUE these medications which have NOT CHANGED   Details  amLODipine (NORVASC) 5 MG tablet Take 1 tablet (5 mg total) by mouth daily. Qty: 30 tablet, Refills: 3    aspirin EC 81 MG tablet Take 1 tablet by mouth daily.    Cholecalciferol (VITAMIN D3) 100000 UNIT/GM POWD Take by mouth.   Associated Diagnoses: Anemia due to other cause    Coenzyme Q10 100 MG capsule Take 1 capsule by mouth daily.    oxyCODONE-acetaminophen (PERCOCET/ROXICET) 5-325 MG tablet Take 1 tablet by mouth 3 times/day as needed-between meals & bedtime.    Associated Diagnoses: Other iron deficiency anemias    thyroid (ARMOUR) 120 MG tablet Take 120 mg by mouth daily before breakfast.    triamcinolone cream (KENALOG) 0.1 % Apply 1 application topically as needed.    vitamin E 200 UNIT capsule Take 200 Units by mouth daily.      STOP taking these medications     sulfamethoxazole-trimethoprim (BACTRIM DS,SEPTRA DS) 800-160 MG tablet      ibuprofen (ADVIL,MOTRIN) 600 MG tablet         If you experience worsening of your admission symptoms, develop shortness of breath, life threatening emergency, suicidal or homicidal thoughts you  must seek medical attention immediately by calling 911 or calling your MD immediately  if symptoms less severe.  You Must read complete instructions/literature along with all the possible adverse reactions/side effects for all the Medicines you take and that have been prescribed to you. Take any new Medicines after you have completely understood and accept all the possible adverse reactions/side effects.   Please note  You were cared for by a hospitalist during your hospital stay. If you have  any questions about your discharge medications or the care you received while you were in the hospital after you are discharged, you can call the unit and asked to speak with the hospitalist on call if the hospitalist that took care of you is not available. Once you are discharged, your primary care physician will handle any further medical issues. Please note that NO REFILLS for any discharge medications will be authorized once you are discharged, as it is imperative that you return to your primary care physician (or establish a relationship with a primary care physician if you do not have one) for your aftercare needs so that they can reassess your need for medications and monitor your lab values. Today   SUBJECTIVE   Doing well  VITAL SIGNS:  Blood pressure (!) 140/40, pulse 74, temperature 97.5 F (36.4 C), temperature source Oral, resp. rate 20, height 5\' 3"  (1.6 m), weight 49 kg (108 lb), SpO2 97 %.  I/O:   Intake/Output Summary (Last 24 hours) at 10/03/16 1213 Last data filed at 10/03/16 0900  Gross per 24 hour  Intake             3329 ml  Output                0 ml  Net             3329 ml    PHYSICAL EXAMINATION:  GENERAL:  82 y.o.-year-old patient lying in the bed with no acute distress.  EYES: Pupils equal, round, reactive to light and accommodation. No scleral icterus. Extraocular muscles intact.  HEENT: Head atraumatic, normocephalic. Oropharynx and nasopharynx clear.  NECK:  Supple, no jugular venous distention. No thyroid enlargement, no tenderness.  LUNGS: Normal breath sounds bilaterally, no wheezing, rales,rhonchi or crepitation. No use of accessory muscles of respiration.  CARDIOVASCULAR: S1, S2 normal. No murmurs, rubs, or gallops.  ABDOMEN: Soft, non-tender, non-distended. Bowel sounds present. No organomegaly or mass.  EXTREMITIES: No pedal edema, cyanosis, or clubbing.  NEUROLOGIC: Cranial nerves II through XII are intact. Muscle strength 5/5 in all extremities.  Sensation intact. Gait not checked.  PSYCHIATRIC: The patient is alert and oriented x 3.  SKIN: No obvious rash, lesion, or ulcer.   DATA REVIEW:   CBC   Recent Labs Lab 10/03/16 0437  WBC 5.8  HGB 9.5*  HCT 27.2*  PLT 212    Chemistries   Recent Labs Lab 10/02/16 1037 10/03/16 0437  NA 123* 132*  K 4.2 4.7  CL 91* 107  CO2 23 21*  GLUCOSE 113* 79  BUN 23* 18  CREATININE 1.90* 1.70*  CALCIUM 9.3 8.6*  AST 42*  --   ALT 28  --   ALKPHOS 60  --   BILITOT 0.4  --     Microbiology Results   Recent Results (from the past 240 hour(s))  Urine culture     Status: Abnormal   Collection Time: 09/26/16 11:15 AM  Result Value Ref Range Status   Specimen Description URINE, RANDOM  Final   Special Requests NONE  Final   Culture >=100,000 COLONIES/mL ESCHERICHIA COLI (A)  Final   Report Status 09/28/2016 FINAL  Final   Organism ID, Bacteria ESCHERICHIA COLI (A)  Final      Susceptibility   Escherichia coli - MIC*    AMPICILLIN 4 SENSITIVE Sensitive     CEFAZOLIN <=4 SENSITIVE Sensitive     CEFTRIAXONE <=1 SENSITIVE Sensitive     CIPROFLOXACIN <=0.25 SENSITIVE Sensitive     GENTAMICIN <=1 SENSITIVE Sensitive     IMIPENEM <=0.25 SENSITIVE Sensitive     NITROFURANTOIN <=16 SENSITIVE Sensitive     TRIMETH/SULFA <=20 SENSITIVE Sensitive     AMPICILLIN/SULBACTAM <=2 SENSITIVE Sensitive     PIP/TAZO <=4 SENSITIVE Sensitive     Extended ESBL NEGATIVE Sensitive     * >=100,000 COLONIES/mL ESCHERICHIA COLI    RADIOLOGY:  Dg Chest 2 View  Result Date: 10/02/2016 CLINICAL DATA:  Patient passed out today. EXAM: CHEST  2 VIEW COMPARISON:  Chest x-ray 12/24/2011.  CT scan 10/15/2013. FINDINGS: Hyperexpansion is consistent with emphysema. No focal airspace consolidation. No pulmonary edema or pleural effusion. Ill-defined small nodular density projects at the right lung base. The cardiopericardial silhouette is within normal limits for size. Right-sided permanent pacemaker noted.  Bones are diffusely demineralized with evidence of lower thoracic vertebral augmentation. Telemetry leads overlie the chest. IMPRESSION: Emphysema with tiny nodular density right lung base more conspicuous than before. CT chest without contrast recommended to further evaluate. Electronically Signed   By: Misty Stanley M.D.   On: 10/02/2016 11:20     Management plans discussed with the patient, family and they are in agreement.  CODE STATUS:     Code Status Orders        Start     Ordered   10/02/16 1545  Full code  Continuous     10/02/16 1544    Code Status History    Date Active Date Inactive Code Status Order ID Comments User Context   06/17/2015  1:03 PM 06/17/2015  5:36 PM Full Code [id number]  Hessie Knows, MD Inpatient      TOTAL TIME TAKING CARE OF THIS PATIENT: 40 minutes.    Akashdeep Chuba M.D on 10/03/2016 at 12:13 PM  Between 7am to 6pm - Pager - [phone] After 6pm go to [email or web address] - password [email or web address] [facility]  Office  [phone]  CC: Primary care physician; Idelle Crouch, MD

## 2017-01-16 ENCOUNTER — Inpatient Hospital Stay: Payer: Medicare Other | Attending: Internal Medicine

## 2017-01-16 DIAGNOSIS — I129 Hypertensive chronic kidney disease with stage 1 through stage 4 chronic kidney disease, or unspecified chronic kidney disease: Secondary | ICD-10-CM | POA: Diagnosis not present

## 2017-01-16 DIAGNOSIS — Z79899 Other long term (current) drug therapy: Secondary | ICD-10-CM | POA: Insufficient documentation

## 2017-01-16 DIAGNOSIS — Z885 Allergy status to narcotic agent status: Secondary | ICD-10-CM | POA: Insufficient documentation

## 2017-01-16 DIAGNOSIS — Z90710 Acquired absence of both cervix and uterus: Secondary | ICD-10-CM | POA: Diagnosis not present

## 2017-01-16 DIAGNOSIS — K2940 Chronic atrophic gastritis without bleeding: Secondary | ICD-10-CM | POA: Insufficient documentation

## 2017-01-16 DIAGNOSIS — Z8541 Personal history of malignant neoplasm of cervix uteri: Secondary | ICD-10-CM | POA: Diagnosis not present

## 2017-01-16 DIAGNOSIS — Z8542 Personal history of malignant neoplasm of other parts of uterus: Secondary | ICD-10-CM | POA: Insufficient documentation

## 2017-01-16 DIAGNOSIS — R0602 Shortness of breath: Secondary | ICD-10-CM | POA: Insufficient documentation

## 2017-01-16 DIAGNOSIS — Q2733 Arteriovenous malformation of digestive system vessel: Secondary | ICD-10-CM | POA: Insufficient documentation

## 2017-01-16 DIAGNOSIS — H409 Unspecified glaucoma: Secondary | ICD-10-CM | POA: Insufficient documentation

## 2017-01-16 DIAGNOSIS — N183 Chronic kidney disease, stage 3 (moderate): Secondary | ICD-10-CM | POA: Diagnosis not present

## 2017-01-16 DIAGNOSIS — R5382 Chronic fatigue, unspecified: Secondary | ICD-10-CM | POA: Insufficient documentation

## 2017-01-16 DIAGNOSIS — D500 Iron deficiency anemia secondary to blood loss (chronic): Secondary | ICD-10-CM | POA: Diagnosis present

## 2017-01-16 DIAGNOSIS — K219 Gastro-esophageal reflux disease without esophagitis: Secondary | ICD-10-CM | POA: Insufficient documentation

## 2017-01-16 DIAGNOSIS — D508 Other iron deficiency anemias: Secondary | ICD-10-CM

## 2017-01-16 DIAGNOSIS — E039 Hypothyroidism, unspecified: Secondary | ICD-10-CM | POA: Insufficient documentation

## 2017-01-16 DIAGNOSIS — Z7982 Long term (current) use of aspirin: Secondary | ICD-10-CM | POA: Diagnosis not present

## 2017-01-16 LAB — CBC WITH DIFFERENTIAL/PLATELET
BASOS ABS: 0.1 10*3/uL (ref 0–0.1)
BASOS PCT: 1 %
Eosinophils Absolute: 0.4 10*3/uL (ref 0–0.7)
Eosinophils Relative: 6 %
HEMATOCRIT: 37.4 % (ref 35.0–47.0)
Hemoglobin: 12.5 g/dL (ref 12.0–16.0)
LYMPHS PCT: 17 %
Lymphs Abs: 1.1 10*3/uL (ref 1.0–3.6)
MCH: 30 pg (ref 26.0–34.0)
MCHC: 33.3 g/dL (ref 32.0–36.0)
MCV: 90.1 fL (ref 80.0–100.0)
Monocytes Absolute: 0.4 10*3/uL (ref 0.2–0.9)
Monocytes Relative: 7 %
NEUTROS ABS: 4.5 10*3/uL (ref 1.4–6.5)
NEUTROS PCT: 69 %
Platelets: 245 10*3/uL (ref 150–440)
RBC: 4.15 MIL/uL (ref 3.80–5.20)
RDW: 12.9 % (ref 11.5–14.5)
WBC: 6.5 10*3/uL (ref 3.6–11.0)

## 2017-01-16 LAB — BASIC METABOLIC PANEL
ANION GAP: 6 (ref 5–15)
BUN: 29 mg/dL — ABNORMAL HIGH (ref 6–20)
CALCIUM: 9.3 mg/dL (ref 8.9–10.3)
CO2: 27 mmol/L (ref 22–32)
Chloride: 103 mmol/L (ref 101–111)
Creatinine, Ser: 1.37 mg/dL — ABNORMAL HIGH (ref 0.44–1.00)
GFR calc Af Amer: 40 mL/min — ABNORMAL LOW (ref >60)
GFR, EST NON AFRICAN AMERICAN: 34 mL/min — AB (ref >60)
GLUCOSE: 100 mg/dL — AB (ref 65–99)
Potassium: 4.8 mmol/L (ref 3.5–5.1)
Sodium: 136 mmol/L (ref 135–145)

## 2017-01-16 LAB — FERRITIN: Ferritin: 85 ng/mL (ref 11–307)

## 2017-01-16 LAB — IRON AND TIBC
Iron: 106 ug/dL (ref 28–170)
SATURATION RATIOS: 30 % (ref 10.4–31.8)
TIBC: 354 ug/dL (ref 250–450)
UIBC: 248 ug/dL

## 2017-01-23 ENCOUNTER — Inpatient Hospital Stay: Payer: Medicare Other

## 2017-01-23 ENCOUNTER — Inpatient Hospital Stay (HOSPITAL_BASED_OUTPATIENT_CLINIC_OR_DEPARTMENT_OTHER): Payer: Medicare Other | Admitting: Internal Medicine

## 2017-01-23 VITALS — BP 193/70 | HR 75 | Temp 96.5°F | Resp 18 | Wt 113.1 lb

## 2017-01-23 DIAGNOSIS — I129 Hypertensive chronic kidney disease with stage 1 through stage 4 chronic kidney disease, or unspecified chronic kidney disease: Secondary | ICD-10-CM

## 2017-01-23 DIAGNOSIS — R0602 Shortness of breath: Secondary | ICD-10-CM

## 2017-01-23 DIAGNOSIS — N183 Chronic kidney disease, stage 3 (moderate): Secondary | ICD-10-CM

## 2017-01-23 DIAGNOSIS — D500 Iron deficiency anemia secondary to blood loss (chronic): Secondary | ICD-10-CM | POA: Diagnosis not present

## 2017-01-23 DIAGNOSIS — Z7982 Long term (current) use of aspirin: Secondary | ICD-10-CM

## 2017-01-23 DIAGNOSIS — K219 Gastro-esophageal reflux disease without esophagitis: Secondary | ICD-10-CM

## 2017-01-23 DIAGNOSIS — Z79899 Other long term (current) drug therapy: Secondary | ICD-10-CM | POA: Diagnosis not present

## 2017-01-23 DIAGNOSIS — K2940 Chronic atrophic gastritis without bleeding: Secondary | ICD-10-CM | POA: Diagnosis not present

## 2017-01-23 DIAGNOSIS — H409 Unspecified glaucoma: Secondary | ICD-10-CM | POA: Diagnosis not present

## 2017-01-23 DIAGNOSIS — Q2733 Arteriovenous malformation of digestive system vessel: Secondary | ICD-10-CM

## 2017-01-23 DIAGNOSIS — Z90710 Acquired absence of both cervix and uterus: Secondary | ICD-10-CM

## 2017-01-23 DIAGNOSIS — R5382 Chronic fatigue, unspecified: Secondary | ICD-10-CM

## 2017-01-23 DIAGNOSIS — Z8541 Personal history of malignant neoplasm of cervix uteri: Secondary | ICD-10-CM

## 2017-01-23 DIAGNOSIS — Z885 Allergy status to narcotic agent status: Secondary | ICD-10-CM

## 2017-01-23 DIAGNOSIS — E039 Hypothyroidism, unspecified: Secondary | ICD-10-CM

## 2017-01-23 DIAGNOSIS — Z842 Family history of other diseases of the genitourinary system: Secondary | ICD-10-CM

## 2017-01-23 NOTE — Progress Notes (Signed)
Here for follow up

## 2017-01-23 NOTE — Assessment & Plan Note (Addendum)
IDA-  Likely GI blood loss status st IV Venofer;  Most recent  Iron studies- saturation 30%/ hemglobin12.5;  Recommend by mouth iron. NO IV iro today.  #  GI blod loss-  : AvM status post- laser treatment.   # CKD Stage III- sec to HTN. See below  #   Hypertension-  Improved/ but not well controlled; discussed the importance of better control of the blood pressures. recommend follow up with PCP.   # recommend follow-up 6 months/labs/- 1 week prior Possible Venofer.

## 2017-01-23 NOTE — Progress Notes (Signed)
[facility] NOTE  Patient Care Team: Idelle Crouch, MD as PCP - General (Internal Medicine)  CHIEF COMPLAINTS/PURPOSE OF CONSULTATION:   # July 2017  IRON DEFICEINCY ANEMIA [since Feb 2016] IV vennofer; NOV 2017- colo- transverse colon AVM s/p APC; EGD- atrophic gastritis [Dr.Skulskie]  HISTORY OF PRESENTING ILLNESS:  Patient-  Poor historian/ vague.  Courtney Manning 83 y.o.  female is here post IV iron for iron deficiency anemia-  Patient stau post  EGD colonoscopy. Colonoscopy showedAVM status post laser.   Patient continues to complain of chronic fatigue. Otherwise denies any nausea vomiting. Denies any blood in stools or black stools. She takes iron pills on an inconsistent basis.  Notes to  have some improvement of shortness of breath. Denies any night sweats or fevers.   ROS: A complete 10 point review of system is done which is negative except mentioned above in history of present illness  MEDICAL HISTORY:  Past Medical History:  Diagnosis Date  . Anemia   . Cervical cancer ([place])    Uterine Cancer  . Constipation   . GERD (gastroesophageal reflux disease)   . Glaucoma   . Hypertension   . Hypothyroidism   . Insomnia   . Spinal stenosis   . Thyroid disease     SURGICAL HISTORY: Past Surgical History:  Procedure Laterality Date  . ABDOMINAL HYSTERECTOMY    . BACK SURGERY    . COLONOSCOPY WITH PROPOFOL N/A 09/07/2016   Procedure: COLONOSCOPY WITH PROPOFOL;  Surgeon: Lollie Sails, MD;  Location: [facility] ENDOSCOPY;  Service: Endoscopy;  Laterality: N/A;  . ESOPHAGOGASTRODUODENOSCOPY (EGD) WITH PROPOFOL N/A 09/07/2016   Procedure: ESOPHAGOGASTRODUODENOSCOPY (EGD) WITH PROPOFOL;  Surgeon: Lollie Sails, MD;  Location: [facility] ENDOSCOPY;  Service: Endoscopy;  Laterality: N/A;  . HIP SURGERY    . JOINT REPLACEMENT     Hip Replacement  . KYPHOPLASTY N/A 06/17/2015   Procedure: KYPHOPLASTY;  Surgeon: Hessie Knows, MD;  Location: [facility] ORS;  Service:  Orthopedics;  Laterality: N/A;  . PACEMAKER INSERTION    . SPINE SURGERY      SOCIAL HISTORY: No smoking or alcohol. Social History   Social History  . Marital status: Married    Spouse name: N/A  . Number of children: N/A  . Years of education: N/A   Occupational History  . Not on file.   Social History Main Topics  . Smoking status: Never Smoker  . Smokeless tobacco: Never Used  . Alcohol use No  . Drug use: No  . Sexual activity: Not on file   Other Topics Concern  . Not on file   Social History Narrative  . No narrative on file    FAMILY HISTORY:Denies any family history of cancer. Family History  Problem Relation Age of Onset  . Diabetes Mellitus II Mother   . Diabetes Mellitus II Father   . Hypertension Father     ALLERGIES:  is allergic to codeine; brimonidine tartrate; fluoxetine; lodine [etodolac]; morphine; omeprazole; ciprofloxacin; and gabapentin.  MEDICATIONS:  Current Outpatient Prescriptions  Medication Sig Dispense Refill  . Coenzyme Q10 100 MG capsule Take 1 capsule by mouth daily.    Marland Kitchen lisinopril (PRINIVIL,ZESTRIL) 10 MG tablet Take 10 mg by mouth daily.    Marland Kitchen oxyCODONE-acetaminophen (PERCOCET/ROXICET) 5-325 MG tablet Take 1 tablet by mouth 3 times/day as needed-between meals & bedtime.     Marland Kitchen thyroid (ARMOUR) 120 MG tablet Take 120 mg by mouth daily before breakfast.    .  aspirin EC 81 MG tablet Take 1 tablet by mouth daily.    . Cholecalciferol (VITAMIN D3) 100000 UNIT/GM POWD Take by mouth.    . triamcinolone cream (KENALOG) 0.1 % Apply 1 application topically as needed.    . vitamin E 200 UNIT capsule Take 200 Units by mouth daily.     No current facility-administered medications for this visit.      PHYSICAL EXAMINATION:   Vitals:   01/23/17 1124  BP: (!) 193/70  Pulse: 75  Resp: 18  Temp: (!) 96.5 F (35.8 C)   Filed Weights   01/23/17 1124  Weight: 113 lb 1.5 oz (51.3 kg)    GENERAL: Well-nourished well-developed; Alert,  no distress and comfortable.  Accompanied by her husband. She walks with a cane. EYES: no pallor or icterus OROPHARYNX: no thrush or ulceration; good dentition  NECK: supple, no masses felt LYMPH:  no palpable lymphadenopathy in the cervical, axillary or inguinal regions LUNGS: clear to auscultation and  No wheeze or crackles HEART/CVS: regular rate & rhythm and no murmurs; No lower extremity edema ABDOMEN: abdomen soft, non-tender and normal bowel sounds Musculoskeletal:no cyanosis of digits and no clubbing  PSYCH: alert & oriented x 3 with fluent speech NEURO: no focal motor/sensory deficits SKIN:  no rashes or significant lesions  LABORATORY DATA:  I have reviewed the data as listed Lab Results  Component Value Date   WBC 6.5 01/16/2017   HGB 12.5 01/16/2017   HCT 37.4 01/16/2017   MCV 90.1 01/16/2017   PLT 245 01/16/2017    Recent Labs  05/16/16 1447 06/27/16 0914  10/02/16 1037 10/03/16 0437 01/16/17 1114  NA 132* 136  < > 123* 132* 136  K 4.9 5.0  < > 4.2 4.7 4.8  CL 102 104  < > 91* 107 103  CO2 26 26  < > 23 21* 27  GLUCOSE 104* 98  < > 113* 79 100*  BUN 35* 36*  < > 23* 18 29*  CREATININE 1.34* 1.52*  < > 1.90* 1.70* 1.37*  CALCIUM 9.0 9.2  < > 9.3 8.6* 9.3  GFRNONAA 35* 30*  < > 23* 26* 34*  GFRAA 41* 35*  < > 27* 30* 40*  PROT 7.0 7.2  --  6.8  --   --   ALBUMIN 3.9 4.3  --  3.9  --   --   AST 21 24  --  42*  --   --   ALT 20 23  --  28  --   --   ALKPHOS 81 86  --  60  --   --   BILITOT 0.5 0.4  --  0.4  --   --   < > = values in this interval not displayed.  RADIOGRAPHIC STUDIES: I have personally reviewed the radiological images as listed and agreed with the findings in the report. No results found.  ASSESSMENT & PLAN:  Iron deficiency anemia due to chronic blood loss IDA-  Likely GI blood loss status st IV Venofer;  Most recent  Iron studies- saturation 30%/ hemglobin12.5;  Recommend by mouth iron. NO IV iro today.  #  GI blod loss-  : AvM  status post- laser treatment.   # CKD Stage III- sec to HTN. See below  #   Hypertension-  Improved/ but not well controlled; discussed the importance of better control of the blood pressures. recommend follow up with PCP.   # recommend follow-up 6 months/labs/- 1 week  prior Possible Venofer.   All questions were answered. The patient knows to call the clinic with any problems, questions or concerns.    [name], MD 01/23/2017 1:02 PM

## 2017-07-19 ENCOUNTER — Inpatient Hospital Stay: Payer: Medicare Other | Attending: Internal Medicine

## 2017-07-19 DIAGNOSIS — R0602 Shortness of breath: Secondary | ICD-10-CM | POA: Diagnosis not present

## 2017-07-19 DIAGNOSIS — H409 Unspecified glaucoma: Secondary | ICD-10-CM | POA: Diagnosis not present

## 2017-07-19 DIAGNOSIS — M549 Dorsalgia, unspecified: Secondary | ICD-10-CM | POA: Insufficient documentation

## 2017-07-19 DIAGNOSIS — Z950 Presence of cardiac pacemaker: Secondary | ICD-10-CM | POA: Diagnosis not present

## 2017-07-19 DIAGNOSIS — G8929 Other chronic pain: Secondary | ICD-10-CM | POA: Insufficient documentation

## 2017-07-19 DIAGNOSIS — R5382 Chronic fatigue, unspecified: Secondary | ICD-10-CM | POA: Diagnosis not present

## 2017-07-19 DIAGNOSIS — N183 Chronic kidney disease, stage 3 (moderate): Secondary | ICD-10-CM | POA: Insufficient documentation

## 2017-07-19 DIAGNOSIS — Z885 Allergy status to narcotic agent status: Secondary | ICD-10-CM | POA: Insufficient documentation

## 2017-07-19 DIAGNOSIS — Z7982 Long term (current) use of aspirin: Secondary | ICD-10-CM | POA: Insufficient documentation

## 2017-07-19 DIAGNOSIS — E039 Hypothyroidism, unspecified: Secondary | ICD-10-CM | POA: Insufficient documentation

## 2017-07-19 DIAGNOSIS — Z79899 Other long term (current) drug therapy: Secondary | ICD-10-CM | POA: Insufficient documentation

## 2017-07-19 DIAGNOSIS — K219 Gastro-esophageal reflux disease without esophagitis: Secondary | ICD-10-CM | POA: Insufficient documentation

## 2017-07-19 DIAGNOSIS — K2940 Chronic atrophic gastritis without bleeding: Secondary | ICD-10-CM | POA: Insufficient documentation

## 2017-07-19 DIAGNOSIS — D500 Iron deficiency anemia secondary to blood loss (chronic): Secondary | ICD-10-CM | POA: Insufficient documentation

## 2017-07-19 DIAGNOSIS — Z881 Allergy status to other antibiotic agents status: Secondary | ICD-10-CM | POA: Insufficient documentation

## 2017-07-19 DIAGNOSIS — I129 Hypertensive chronic kidney disease with stage 1 through stage 4 chronic kidney disease, or unspecified chronic kidney disease: Secondary | ICD-10-CM | POA: Insufficient documentation

## 2017-07-19 DIAGNOSIS — Q2733 Arteriovenous malformation of digestive system vessel: Secondary | ICD-10-CM | POA: Diagnosis not present

## 2017-07-19 LAB — IRON AND TIBC
IRON: 107 ug/dL (ref 28–170)
SATURATION RATIOS: 31 % (ref 10.4–31.8)
TIBC: 343 ug/dL (ref 250–450)
UIBC: 236 ug/dL

## 2017-07-19 LAB — CBC WITH DIFFERENTIAL/PLATELET
BASOS ABS: 0.1 10*3/uL (ref 0–0.1)
Basophils Relative: 1 %
EOS ABS: 0.4 10*3/uL (ref 0–0.7)
Eosinophils Relative: 5 %
HCT: 34.9 % — ABNORMAL LOW (ref 35.0–47.0)
HEMOGLOBIN: 11.8 g/dL — AB (ref 12.0–16.0)
LYMPHS ABS: 1.1 10*3/uL (ref 1.0–3.6)
Lymphocytes Relative: 17 %
MCH: 30.1 pg (ref 26.0–34.0)
MCHC: 33.8 g/dL (ref 32.0–36.0)
MCV: 89.1 fL (ref 80.0–100.0)
Monocytes Absolute: 0.5 10*3/uL (ref 0.2–0.9)
Monocytes Relative: 7 %
NEUTROS PCT: 70 %
Neutro Abs: 4.7 10*3/uL (ref 1.4–6.5)
PLATELETS: 248 10*3/uL (ref 150–440)
RBC: 3.92 MIL/uL (ref 3.80–5.20)
RDW: 12.7 % (ref 11.5–14.5)
WBC: 6.7 10*3/uL (ref 3.6–11.0)

## 2017-07-19 LAB — FERRITIN: FERRITIN: 59 ng/mL (ref 11–307)

## 2017-07-23 ENCOUNTER — Other Ambulatory Visit: Payer: Self-pay | Admitting: Internal Medicine

## 2017-07-23 DIAGNOSIS — D500 Iron deficiency anemia secondary to blood loss (chronic): Secondary | ICD-10-CM

## 2017-07-24 ENCOUNTER — Inpatient Hospital Stay: Payer: Medicare Other

## 2017-07-24 ENCOUNTER — Inpatient Hospital Stay (HOSPITAL_BASED_OUTPATIENT_CLINIC_OR_DEPARTMENT_OTHER): Payer: Medicare Other | Admitting: Internal Medicine

## 2017-07-24 VITALS — BP 159/74 | HR 90 | Temp 97.2°F | Resp 20 | Ht 63.0 in | Wt 112.0 lb

## 2017-07-24 DIAGNOSIS — Z885 Allergy status to narcotic agent status: Secondary | ICD-10-CM

## 2017-07-24 DIAGNOSIS — K2940 Chronic atrophic gastritis without bleeding: Secondary | ICD-10-CM | POA: Diagnosis not present

## 2017-07-24 DIAGNOSIS — Z881 Allergy status to other antibiotic agents status: Secondary | ICD-10-CM

## 2017-07-24 DIAGNOSIS — Q2733 Arteriovenous malformation of digestive system vessel: Secondary | ICD-10-CM

## 2017-07-24 DIAGNOSIS — G8929 Other chronic pain: Secondary | ICD-10-CM

## 2017-07-24 DIAGNOSIS — E039 Hypothyroidism, unspecified: Secondary | ICD-10-CM | POA: Diagnosis not present

## 2017-07-24 DIAGNOSIS — R5382 Chronic fatigue, unspecified: Secondary | ICD-10-CM | POA: Diagnosis not present

## 2017-07-24 DIAGNOSIS — D500 Iron deficiency anemia secondary to blood loss (chronic): Secondary | ICD-10-CM | POA: Diagnosis not present

## 2017-07-24 DIAGNOSIS — N183 Chronic kidney disease, stage 3 (moderate): Secondary | ICD-10-CM | POA: Diagnosis not present

## 2017-07-24 DIAGNOSIS — Z79899 Other long term (current) drug therapy: Secondary | ICD-10-CM

## 2017-07-24 DIAGNOSIS — R0602 Shortness of breath: Secondary | ICD-10-CM

## 2017-07-24 DIAGNOSIS — K219 Gastro-esophageal reflux disease without esophagitis: Secondary | ICD-10-CM | POA: Diagnosis not present

## 2017-07-24 DIAGNOSIS — M549 Dorsalgia, unspecified: Secondary | ICD-10-CM | POA: Diagnosis not present

## 2017-07-24 DIAGNOSIS — Z7982 Long term (current) use of aspirin: Secondary | ICD-10-CM

## 2017-07-24 DIAGNOSIS — I129 Hypertensive chronic kidney disease with stage 1 through stage 4 chronic kidney disease, or unspecified chronic kidney disease: Secondary | ICD-10-CM | POA: Diagnosis not present

## 2017-07-24 DIAGNOSIS — H409 Unspecified glaucoma: Secondary | ICD-10-CM | POA: Diagnosis not present

## 2017-07-24 DIAGNOSIS — Z950 Presence of cardiac pacemaker: Secondary | ICD-10-CM

## 2017-07-24 MED ORDER — SODIUM CHLORIDE 0.9 % IV SOLN
Freq: Once | INTRAVENOUS | Status: AC
  Administered 2017-07-24: 11:00:00 via INTRAVENOUS
  Filled 2017-07-24: qty 1000

## 2017-07-24 MED ORDER — IRON SUCROSE 20 MG/ML IV SOLN
INTRAVENOUS | Status: AC
Start: 2017-07-24 — End: 2017-07-24
  Filled 2017-07-24: qty 10

## 2017-07-24 MED ORDER — IRON SUCROSE 20 MG/ML IV SOLN
200.0000 mg | Freq: Once | INTRAVENOUS | Status: AC
  Administered 2017-07-24: 200 mg via INTRAVENOUS
  Filled 2017-07-24: qty 10

## 2017-07-24 NOTE — Progress Notes (Signed)
[place] NOTE  Patient Care Team: Idelle Crouch, MD as PCP - General (Internal Medicine)  CHIEF COMPLAINTS/PURPOSE OF CONSULTATION:   # July 2017  IRON DEFICEINCY ANEMIA-CKD stage III [since Feb 2016] IV vennofer;  # NOV 2017- colo- transverse colon AVM s/p APC; EGD- atrophic gastritis [Dr.Skulskie]  # CKD stage III [Dr.Kolluru]; Chronic back pain; HTN- poorly controlled.   HISTORY OF PRESENTING ILLNESS:  Patient-  Poor historian/ vague.  Courtney Manning 83 y.o.  female is here post IV iron for iron deficiency anemia.   Patient continues to complain of chronic fatigue/chronic back pain. Otherwise denies any nausea vomiting. Denies any blood in stools or black stools. She takes iron pills on an inconsistent basis.  Notes to  have some improvement of shortness of breath. Denies any night sweats or fevers. She has not been taking her blood pressure medications as recommended. States her blood pressures have been running high at home.  ROS: A complete 10 point review of system is done which is negative except mentioned above in history of present illness  MEDICAL HISTORY:  Past Medical History:  Diagnosis Date  . Anemia   . Cervical cancer ([place])    Uterine Cancer  . Constipation   . GERD (gastroesophageal reflux disease)   . Glaucoma   . Hypertension   . Hypothyroidism   . Insomnia   . Spinal stenosis   . Thyroid disease     SURGICAL HISTORY: Past Surgical History:  Procedure Laterality Date  . ABDOMINAL HYSTERECTOMY    . BACK SURGERY    . COLONOSCOPY WITH PROPOFOL N/A 09/07/2016   Procedure: COLONOSCOPY WITH PROPOFOL;  Surgeon: Lollie Sails, MD;  Location: [facility] ENDOSCOPY;  Service: Endoscopy;  Laterality: N/A;  . ESOPHAGOGASTRODUODENOSCOPY (EGD) WITH PROPOFOL N/A 09/07/2016   Procedure: ESOPHAGOGASTRODUODENOSCOPY (EGD) WITH PROPOFOL;  Surgeon: Lollie Sails, MD;  Location: [facility] ENDOSCOPY;  Service: Endoscopy;  Laterality: N/A;  . HIP  SURGERY    . JOINT REPLACEMENT     Hip Replacement  . KYPHOPLASTY N/A 06/17/2015   Procedure: KYPHOPLASTY;  Surgeon: Hessie Knows, MD;  Location: [facility] ORS;  Service: Orthopedics;  Laterality: N/A;  . PACEMAKER INSERTION    . SPINE SURGERY      SOCIAL HISTORY: No smoking or alcohol. Social History   Social History  . Marital status: Married    Spouse name: N/A  . Number of children: N/A  . Years of education: N/A   Occupational History  . Not on file.   Social History Main Topics  . Smoking status: Never Smoker  . Smokeless tobacco: Never Used  . Alcohol use No  . Drug use: No  . Sexual activity: Not on file   Other Topics Concern  . Not on file   Social History Narrative  . No narrative on file    FAMILY HISTORY:Denies any family history of cancer. Family History  Problem Relation Age of Onset  . Diabetes Mellitus II Mother   . Diabetes Mellitus II Father   . Hypertension Father     ALLERGIES:  is allergic to codeine; brimonidine tartrate; fluoxetine; lodine [etodolac]; morphine; omeprazole; ciprofloxacin; and gabapentin.  MEDICATIONS:  Current Outpatient Prescriptions  Medication Sig Dispense Refill  . Cholecalciferol (VITAMIN D3) 100000 UNIT/GM POWD Take by mouth.    . Coenzyme Q10 100 MG capsule Take 1 capsule by mouth daily.    Marland Kitchen lisinopril (PRINIVIL,ZESTRIL) 10 MG tablet Take 10 mg by mouth daily.    Marland Kitchen  oxyCODONE-acetaminophen (PERCOCET/ROXICET) 5-325 MG tablet Take 1 tablet by mouth 3 times/day as needed-between meals & bedtime.     Marland Kitchen thyroid (ARMOUR) 30 MG tablet Take 120 mg by mouth daily before breakfast.    . vitamin E 200 UNIT capsule Take 200 Units by mouth daily.    Marland Kitchen aspirin EC 81 MG tablet Take 1 tablet by mouth daily.    Marland Kitchen triamcinolone cream (KENALOG) 0.1 % Apply 1 application topically as needed.     No current facility-administered medications for this visit.      PHYSICAL EXAMINATION:   Vitals:   07/24/17 1022  BP: (!) 159/74   Pulse: 90  Resp: 20  Temp: (!) 97.2 F (36.2 C)   Filed Weights   07/24/17 1022  Weight: 112 lb (50.8 kg)    GENERAL: Well-nourished well-developed; Alert, no distress and comfortable.  Accompanied by her husband. She walks with a cane. EYES: no pallor or icterus OROPHARYNX: no thrush or ulceration; good dentition  NECK: supple, no masses felt LYMPH:  no palpable lymphadenopathy in the cervical, axillary or inguinal regions LUNGS: clear to auscultation and  No wheeze or crackles HEART/CVS: regular rate & rhythm and no murmurs; No lower extremity edema ABDOMEN: abdomen soft, non-tender and normal bowel sounds Musculoskeletal:no cyanosis of digits and no clubbing  PSYCH: alert & oriented x 3 with fluent speech NEURO: no focal motor/sensory deficits SKIN:  no rashes or significant lesions  LABORATORY DATA:  I have reviewed the data as listed Lab Results  Component Value Date   WBC 6.7 07/19/2017   HGB 11.8 (L) 07/19/2017   HCT 34.9 (L) 07/19/2017   MCV 89.1 07/19/2017   PLT 248 07/19/2017    Recent Labs  10/02/16 1037 10/03/16 0437 01/16/17 1114  NA 123* 132* 136  K 4.2 4.7 4.8  CL 91* 107 103  CO2 23 21* 27  GLUCOSE 113* 79 100*  BUN 23* 18 29*  CREATININE 1.90* 1.70* 1.37*  CALCIUM 9.3 8.6* 9.3  GFRNONAA 23* 26* 34*  GFRAA 27* 30* 40*  PROT 6.8  --   --   ALBUMIN 3.9  --   --   AST 42*  --   --   ALT 28  --   --   ALKPHOS 60  --   --   BILITOT 0.4  --   --     RADIOGRAPHIC STUDIES: I have personally reviewed the radiological images as listed and agreed with the findings in the report. No results found.  ASSESSMENT & PLAN:  Iron deficiency anemia due to chronic blood loss IDA-  Likely GI blood loss status/CKD- stage III  Most recent  Iron studies- saturation 30%; ferrein-59 hemglobin 11.8;  Recommend IV iron today.   #  GI blod loss- no active blood loss. AvM status post- laser treatment.   # CKD Stage III- sec to HTN. [Dr.Kolluru];   #    Hypertension-  Improved/ but not well controlled; discussed the importance of better control of the blood pressures. Recommend compliance.   # recommend follow-up 6 months/labs/- 1 week prior Possible Venofer.   Cc; Dr.Sparks  All questions were answered. The patient knows to call the clinic with any problems, questions or concerns.    [name], MD 07/24/2017 1:18 PM

## 2017-07-24 NOTE — Assessment & Plan Note (Addendum)
IDA-  Likely GI blood loss status/CKD- stage III  Most recent  Iron studies- saturation 30%; ferrein-59 hemglobin 11.8;  Recommend IV iron today.   #  GI blod loss- no active blood loss. AvM status post- laser treatment.   # CKD Stage III- sec to HTN. [Dr.Kolluru];   #   Hypertension-  Improved/ but not well controlled; discussed the importance of better control of the blood pressures. Recommend compliance.   # recommend follow-up 6 months/labs/- 1 week prior Possible Venofer.   Cc; Dr.Sparks

## 2018-05-16 ENCOUNTER — Other Ambulatory Visit: Payer: Self-pay | Admitting: Gastroenterology

## 2018-05-16 DIAGNOSIS — R1013 Epigastric pain: Secondary | ICD-10-CM

## 2018-05-21 ENCOUNTER — Ambulatory Visit: Payer: Medicare Other

## 2018-05-23 ENCOUNTER — Ambulatory Visit
Admission: RE | Admit: 2018-05-23 | Discharge: 2018-05-23 | Disposition: A | Discharge status: Home / Self Care | Payer: Medicare Other | Source: Ambulatory Visit | Attending: Gastroenterology | Admitting: Gastroenterology

## 2018-05-23 DIAGNOSIS — R1013 Epigastric pain: Secondary | ICD-10-CM

## 2018-05-23 DIAGNOSIS — K449 Diaphragmatic hernia without obstruction or gangrene: Secondary | ICD-10-CM | POA: Diagnosis not present

## 2018-11-05 ENCOUNTER — Other Ambulatory Visit: Payer: Self-pay | Admitting: Gastroenterology

## 2018-11-05 DIAGNOSIS — R6881 Early satiety: Secondary | ICD-10-CM

## 2018-11-14 ENCOUNTER — Encounter
Admission: RE | Admit: 2018-11-14 | Discharge: 2018-11-14 | Disposition: A | Discharge status: Home / Self Care | Payer: Medicare Other | Source: Ambulatory Visit | Attending: Gastroenterology | Admitting: Gastroenterology

## 2018-11-14 DIAGNOSIS — R6881 Early satiety: Secondary | ICD-10-CM | POA: Diagnosis present

## 2018-11-14 MED ORDER — TECHNETIUM TC 99M SULFUR COLLOID
2.0000 | Freq: Once | INTRAVENOUS | Status: AC | PRN
  Administered 2018-11-14: 2.199 via ORAL

## 2019-01-06 ENCOUNTER — Other Ambulatory Visit: Payer: Self-pay | Admitting: Internal Medicine

## 2019-01-06 DIAGNOSIS — M7989 Other specified soft tissue disorders: Secondary | ICD-10-CM

## 2019-01-10 ENCOUNTER — Other Ambulatory Visit: Payer: Self-pay

## 2019-01-10 ENCOUNTER — Ambulatory Visit
Admission: RE | Admit: 2019-01-10 | Discharge: 2019-01-10 | Disposition: A | Discharge status: Home / Self Care | Payer: Medicare Other | Source: Ambulatory Visit | Attending: Internal Medicine | Admitting: Internal Medicine

## 2019-01-10 DIAGNOSIS — M7989 Other specified soft tissue disorders: Secondary | ICD-10-CM | POA: Diagnosis present

## 2020-02-26 ENCOUNTER — Other Ambulatory Visit: Payer: Self-pay | Admitting: Family Medicine

## 2020-02-26 DIAGNOSIS — R1310 Dysphagia, unspecified: Secondary | ICD-10-CM

## 2020-06-21 ENCOUNTER — Ambulatory Visit (INDEPENDENT_AMBULATORY_CARE_PROVIDER_SITE_OTHER): Payer: Medicare Other | Admitting: Podiatry

## 2020-06-21 ENCOUNTER — Other Ambulatory Visit: Payer: Self-pay

## 2020-06-21 ENCOUNTER — Encounter: Payer: Self-pay | Admitting: Podiatry

## 2020-06-21 DIAGNOSIS — B351 Tinea unguium: Secondary | ICD-10-CM

## 2020-06-21 DIAGNOSIS — M79674 Pain in right toe(s): Secondary | ICD-10-CM | POA: Diagnosis not present

## 2020-06-21 DIAGNOSIS — M79675 Pain in left toe(s): Secondary | ICD-10-CM | POA: Diagnosis not present

## 2020-06-21 NOTE — Progress Notes (Signed)
This patient presents  to the office for evaluation and treatment of long thick painful nails .  This patient is unable to trim his own nails since the patient cannot reach her feet.  Patient says the nails are painful walking and wearing her shoes.  She presents  for preventive foot care services. Patient presents to the office with her daughter.    General Appearance  Alert, conversant and in no acute stress.  Vascular  Dorsalis pedis and posterior tibial  pulses are weakly  palpable  bilaterally.  Capillary return is within normal limits  bilaterally. Temperature is within normal limits  bilaterally.  Neurologic  Senn-Weinstein monofilament wire test diminished  bilaterally. Muscle power within normal limits bilaterally.  Nails Thick disfigured discolored nails with subungual debris  from hallux to fifth toes bilaterally. No evidence of bacterial infection or drainage bilaterally.  Orthopedic  No limitations of motion  feet .  No crepitus or effusions noted.  No bony pathology or digital deformities noted.  Skin  normotropic skin with no porokeratosis noted bilaterally.  No signs of infections or ulcers noted.     Onychomycosis  Pain in toes right foot  Pain in toes left foot  Debridement  of nails  1-5  B/L with a nail nipper.  Nails were then filed using a dremel tool with no incidents.  Told the daughter to watch left great toenail since it was loose all the way to cuticle.     RTC 3 months    Gardiner Barefoot DPM

## 2020-07-30 ENCOUNTER — Other Ambulatory Visit: Payer: Self-pay

## 2020-07-30 ENCOUNTER — Inpatient Hospital Stay (HOSPITAL_COMMUNITY)
Admit: 2020-07-30 | Discharge: 2020-07-30 | Disposition: A | Discharge status: Home / Self Care | Payer: Medicare Other | Attending: Cardiovascular Disease | Admitting: Cardiovascular Disease

## 2020-07-30 ENCOUNTER — Emergency Department: Payer: Medicare Other

## 2020-07-30 ENCOUNTER — Inpatient Hospital Stay
Admission: EM | Admit: 2020-07-30 | Discharge: 2020-08-02 | DRG: 291 | Disposition: A | Discharge status: Home Health | Payer: Medicare Other | Attending: Internal Medicine | Admitting: Internal Medicine

## 2020-07-30 DIAGNOSIS — B9620 Unspecified Escherichia coli [E. coli] as the cause of diseases classified elsewhere: Secondary | ICD-10-CM | POA: Diagnosis present

## 2020-07-30 DIAGNOSIS — I272 Pulmonary hypertension, unspecified: Secondary | ICD-10-CM | POA: Diagnosis present

## 2020-07-30 DIAGNOSIS — J9601 Acute respiratory failure with hypoxia: Secondary | ICD-10-CM | POA: Diagnosis present

## 2020-07-30 DIAGNOSIS — I10 Essential (primary) hypertension: Secondary | ICD-10-CM | POA: Diagnosis not present

## 2020-07-30 DIAGNOSIS — Z79899 Other long term (current) drug therapy: Secondary | ICD-10-CM

## 2020-07-30 DIAGNOSIS — I509 Heart failure, unspecified: Secondary | ICD-10-CM | POA: Diagnosis not present

## 2020-07-30 DIAGNOSIS — E039 Hypothyroidism, unspecified: Secondary | ICD-10-CM | POA: Diagnosis present

## 2020-07-30 DIAGNOSIS — I5031 Acute diastolic (congestive) heart failure: Secondary | ICD-10-CM | POA: Diagnosis not present

## 2020-07-30 DIAGNOSIS — I248 Other forms of acute ischemic heart disease: Secondary | ICD-10-CM | POA: Diagnosis not present

## 2020-07-30 DIAGNOSIS — N184 Chronic kidney disease, stage 4 (severe): Secondary | ICD-10-CM | POA: Diagnosis present

## 2020-07-30 DIAGNOSIS — I130 Hypertensive heart and chronic kidney disease with heart failure and stage 1 through stage 4 chronic kidney disease, or unspecified chronic kidney disease: Principal | ICD-10-CM | POA: Diagnosis present

## 2020-07-30 DIAGNOSIS — Z8541 Personal history of malignant neoplasm of cervix uteri: Secondary | ICD-10-CM

## 2020-07-30 DIAGNOSIS — J189 Pneumonia, unspecified organism: Secondary | ICD-10-CM

## 2020-07-30 DIAGNOSIS — I129 Hypertensive chronic kidney disease with stage 1 through stage 4 chronic kidney disease, or unspecified chronic kidney disease: Secondary | ICD-10-CM | POA: Diagnosis not present

## 2020-07-30 DIAGNOSIS — K219 Gastro-esophageal reflux disease without esophagitis: Secondary | ICD-10-CM | POA: Diagnosis present

## 2020-07-30 DIAGNOSIS — I442 Atrioventricular block, complete: Secondary | ICD-10-CM | POA: Diagnosis present

## 2020-07-30 DIAGNOSIS — I5033 Acute on chronic diastolic (congestive) heart failure: Secondary | ICD-10-CM | POA: Insufficient documentation

## 2020-07-30 DIAGNOSIS — N179 Acute kidney failure, unspecified: Secondary | ICD-10-CM | POA: Diagnosis present

## 2020-07-30 DIAGNOSIS — Z90710 Acquired absence of both cervix and uterus: Secondary | ICD-10-CM

## 2020-07-30 DIAGNOSIS — E785 Hyperlipidemia, unspecified: Secondary | ICD-10-CM | POA: Diagnosis present

## 2020-07-30 DIAGNOSIS — R778 Other specified abnormalities of plasma proteins: Secondary | ICD-10-CM | POA: Diagnosis present

## 2020-07-30 DIAGNOSIS — I249 Acute ischemic heart disease, unspecified: Secondary | ICD-10-CM

## 2020-07-30 DIAGNOSIS — Z8542 Personal history of malignant neoplasm of other parts of uterus: Secondary | ICD-10-CM

## 2020-07-30 DIAGNOSIS — Z7982 Long term (current) use of aspirin: Secondary | ICD-10-CM

## 2020-07-30 DIAGNOSIS — I959 Hypotension, unspecified: Secondary | ICD-10-CM | POA: Diagnosis not present

## 2020-07-30 DIAGNOSIS — D649 Anemia, unspecified: Secondary | ICD-10-CM | POA: Diagnosis present

## 2020-07-30 DIAGNOSIS — Z950 Presence of cardiac pacemaker: Secondary | ICD-10-CM | POA: Diagnosis not present

## 2020-07-30 DIAGNOSIS — Z20822 Contact with and (suspected) exposure to covid-19: Secondary | ICD-10-CM | POA: Diagnosis present

## 2020-07-30 DIAGNOSIS — R7989 Other specified abnormal findings of blood chemistry: Secondary | ICD-10-CM | POA: Diagnosis present

## 2020-07-30 DIAGNOSIS — N1832 Chronic kidney disease, stage 3b: Secondary | ICD-10-CM | POA: Diagnosis not present

## 2020-07-30 LAB — TROPONIN I (HIGH SENSITIVITY)
Troponin I (High Sensitivity): 41 ng/L — ABNORMAL HIGH (ref <18)
Troponin I (High Sensitivity): 101 ng/L (ref <18)
Troponin I (High Sensitivity): 117 ng/L (ref <18)
Troponin I (High Sensitivity): 131 ng/L (ref <18)
Troponin I (High Sensitivity): 95 ng/L — ABNORMAL HIGH (ref <18)

## 2020-07-30 LAB — URINALYSIS, COMPLETE (UACMP) WITH MICROSCOPIC
Bilirubin Urine: NEGATIVE
Glucose, UA: 150 mg/dL — AB
Hgb urine dipstick: NEGATIVE
Ketones, ur: NEGATIVE mg/dL
Leukocytes,Ua: NEGATIVE
Nitrite: NEGATIVE
Protein, ur: NEGATIVE mg/dL
Specific Gravity, Urine: 1.013 (ref 1.005–1.030)
pH: 5 (ref 5.0–8.0)

## 2020-07-30 LAB — BLOOD GAS, VENOUS
Acid-base deficit: 4.9 mmol/L — ABNORMAL HIGH (ref 0.0–2.0)
Bicarbonate: 23.6 mmol/L (ref 20.0–28.0)
FIO2: 0.35
Mode: POSITIVE
O2 Saturation: 27.1 %
PEEP: 10 cmH2O
Patient temperature: 37
pCO2, Ven: 59 mmHg (ref 44.0–60.0)
pH, Ven: 7.21 — ABNORMAL LOW (ref 7.250–7.430)
pO2, Ven: <31 mmHg — CL (ref 32.0–45.0)

## 2020-07-30 LAB — CBC WITH DIFFERENTIAL/PLATELET
Abs Immature Granulocytes: 0.09 10*3/uL — ABNORMAL HIGH (ref 0.00–0.07)
Basophils Absolute: 0 10*3/uL (ref 0.0–0.1)
Basophils Relative: 0 %
Eosinophils Absolute: 0.1 10*3/uL (ref 0.0–0.5)
Eosinophils Relative: 0 %
HCT: 35.9 % — ABNORMAL LOW (ref 36.0–46.0)
Hemoglobin: 11.4 g/dL — ABNORMAL LOW (ref 12.0–15.0)
Immature Granulocytes: 1 %
Lymphocytes Relative: 5 %
Lymphs Abs: 0.6 10*3/uL — ABNORMAL LOW (ref 0.7–4.0)
MCH: 29.5 pg (ref 26.0–34.0)
MCHC: 31.8 g/dL (ref 30.0–36.0)
MCV: 92.8 fL (ref 80.0–100.0)
Monocytes Absolute: 1.2 10*3/uL — ABNORMAL HIGH (ref 0.1–1.0)
Monocytes Relative: 9 %
Neutro Abs: 10.8 10*3/uL — ABNORMAL HIGH (ref 1.7–7.7)
Neutrophils Relative %: 85 %
Platelets: 343 10*3/uL (ref 150–400)
RBC: 3.87 MIL/uL (ref 3.87–5.11)
RDW: 13 % (ref 11.5–15.5)
WBC: 12.8 10*3/uL — ABNORMAL HIGH (ref 4.0–10.5)
nRBC: 0 % (ref 0.0–0.2)

## 2020-07-30 LAB — HEPARIN LEVEL (UNFRACTIONATED): Heparin Unfractionated: 0.31 IU/mL (ref 0.30–0.70)

## 2020-07-30 LAB — ECHOCARDIOGRAM COMPLETE
AR max vel: 1.1 cm2
AV Area VTI: 1.21 cm2
AV Area mean vel: 1.15 cm2
AV Mean grad: 9.3 mmHg
AV Peak grad: 17 mmHg
Ao pk vel: 2.06 m/s
Height: 64 in
S' Lateral: 2.49 cm
Weight: 1791.9 oz

## 2020-07-30 LAB — COMPREHENSIVE METABOLIC PANEL
ALT: 69 U/L — ABNORMAL HIGH (ref 0–44)
AST: 105 U/L — ABNORMAL HIGH (ref 15–41)
Albumin: 3.7 g/dL (ref 3.5–5.0)
Alkaline Phosphatase: 65 U/L (ref 38–126)
Anion gap: 12 (ref 5–15)
BUN: 31 mg/dL — ABNORMAL HIGH (ref 8–23)
CO2: 20 mmol/L — ABNORMAL LOW (ref 22–32)
Calcium: 8.9 mg/dL (ref 8.9–10.3)
Chloride: 105 mmol/L (ref 98–111)
Creatinine, Ser: 1.6 mg/dL — ABNORMAL HIGH (ref 0.44–1.00)
GFR calc Af Amer: 33 mL/min — ABNORMAL LOW (ref >60)
GFR calc non Af Amer: 28 mL/min — ABNORMAL LOW (ref >60)
Glucose, Bld: 183 mg/dL — ABNORMAL HIGH (ref 70–99)
Potassium: 4.8 mmol/L (ref 3.5–5.1)
Sodium: 137 mmol/L (ref 135–145)
Total Bilirubin: 1 mg/dL (ref 0.3–1.2)
Total Protein: 7.1 g/dL (ref 6.5–8.1)

## 2020-07-30 LAB — MAGNESIUM: Magnesium: 2 mg/dL (ref 1.7–2.4)

## 2020-07-30 LAB — RESPIRATORY PANEL BY RT PCR (FLU A&B, COVID)
Influenza A by PCR: NEGATIVE
Influenza B by PCR: NEGATIVE
SARS Coronavirus 2 by RT PCR: NEGATIVE

## 2020-07-30 LAB — PROTIME-INR
INR: 1.1 (ref 0.8–1.2)
Prothrombin Time: 13.8 seconds (ref 11.4–15.2)

## 2020-07-30 LAB — LACTIC ACID, PLASMA
Lactic Acid, Venous: 3.3 mmol/L (ref 0.5–1.9)
Lactic Acid, Venous: 1.7 mmol/L (ref 0.5–1.9)

## 2020-07-30 LAB — BRAIN NATRIURETIC PEPTIDE: B Natriuretic Peptide: 464.3 pg/mL — ABNORMAL HIGH (ref 0.0–100.0)

## 2020-07-30 LAB — PROCALCITONIN: Procalcitonin: 0.17 ng/mL

## 2020-07-30 LAB — APTT: aPTT: 31 seconds (ref 24–36)

## 2020-07-30 MED ORDER — FUROSEMIDE 10 MG/ML IJ SOLN
40.0000 mg | Freq: Once | INTRAMUSCULAR | Status: AC
  Administered 2020-07-30: 40 mg via INTRAVENOUS
  Filled 2020-07-30: qty 4

## 2020-07-30 MED ORDER — SODIUM CHLORIDE 0.9 % IV SOLN
500.0000 mg | Freq: Once | INTRAVENOUS | Status: AC
  Administered 2020-07-30: 500 mg via INTRAVENOUS
  Filled 2020-07-30: qty 500

## 2020-07-30 MED ORDER — HEPARIN BOLUS VIA INFUSION
3000.0000 [IU] | Freq: Once | INTRAVENOUS | Status: AC
  Administered 2020-07-30: 3000 [IU] via INTRAVENOUS
  Filled 2020-07-30: qty 3000

## 2020-07-30 MED ORDER — SODIUM CHLORIDE 0.9% FLUSH
3.0000 mL | Freq: Two times a day (BID) | INTRAVENOUS | Status: DC
  Administered 2020-07-30 – 2020-08-02 (×5): 3 mL via INTRAVENOUS

## 2020-07-30 MED ORDER — SODIUM CHLORIDE 0.9 % IV SOLN
2.0000 g | Freq: Once | INTRAVENOUS | Status: AC
  Administered 2020-07-30: 2 g via INTRAVENOUS
  Filled 2020-07-30: qty 2

## 2020-07-30 MED ORDER — THYROID 30 MG PO TABS
30.0000 mg | ORAL_TABLET | Freq: Every day | ORAL | Status: DC
  Administered 2020-07-31 – 2020-08-02 (×3): 30 mg via ORAL
  Filled 2020-07-30 (×4): qty 1

## 2020-07-30 MED ORDER — SODIUM CHLORIDE 0.9% FLUSH: 3.0000 mL | INTRAVENOUS | Status: DC | PRN

## 2020-07-30 MED ORDER — ONDANSETRON HCL 4 MG/2ML IJ SOLN: 4.0000 mg | Freq: Four times a day (QID) | INTRAMUSCULAR | Status: DC | PRN

## 2020-07-30 MED ORDER — SODIUM CHLORIDE 0.9 % IV SOLN
250.0000 mL | INTRAVENOUS | Status: DC | PRN
  Administered 2020-07-31: 250 mL via INTRAVENOUS

## 2020-07-30 MED ORDER — ACETAMINOPHEN 325 MG PO TABS
650.0000 mg | ORAL_TABLET | ORAL | Status: DC | PRN
  Administered 2020-07-31: 650 mg via ORAL
  Filled 2020-07-30: qty 2

## 2020-07-30 MED ORDER — AMLODIPINE BESYLATE 10 MG PO TABS
10.0000 mg | ORAL_TABLET | Freq: Every day | ORAL | Status: DC
  Administered 2020-07-30 – 2020-07-31 (×2): 10 mg via ORAL
  Filled 2020-07-30: qty 1
  Filled 2020-07-30: qty 2

## 2020-07-30 MED ORDER — HEPARIN (PORCINE) 25000 UT/250ML-% IV SOLN
750.0000 [IU]/h | INTRAVENOUS | Status: DC
  Administered 2020-07-30: 600 [IU]/h via INTRAVENOUS
  Filled 2020-07-30 (×2): qty 250

## 2020-07-30 MED ORDER — FAMOTIDINE 20 MG PO TABS: 40.0000 mg | ORAL_TABLET | Freq: Every day | ORAL | Status: DC

## 2020-07-30 MED ORDER — LISINOPRIL 10 MG PO TABS
10.0000 mg | ORAL_TABLET | Freq: Every day | ORAL | Status: DC
  Administered 2020-07-30 – 2020-07-31 (×2): 10 mg via ORAL
  Filled 2020-07-30 (×2): qty 1

## 2020-07-30 MED ORDER — MENTHOL 3 MG MT LOZG
1.0000 | LOZENGE | OROMUCOSAL | Status: DC | PRN
  Administered 2020-07-31: 3 mg via ORAL
  Filled 2020-07-30: qty 9

## 2020-07-30 MED ORDER — ASPIRIN EC 81 MG PO TBEC
81.0000 mg | DELAYED_RELEASE_TABLET | Freq: Every day | ORAL | Status: DC
  Administered 2020-07-31 – 2020-08-02 (×3): 81 mg via ORAL
  Filled 2020-07-30 (×3): qty 1

## 2020-07-30 MED ORDER — SODIUM CHLORIDE 0.9 % IV SOLN: 2.0000 g | INTRAVENOUS | Status: DC

## 2020-07-30 MED ORDER — FAMOTIDINE 20 MG PO TABS
20.0000 mg | ORAL_TABLET | Freq: Every day | ORAL | Status: DC
  Administered 2020-07-30 – 2020-08-01 (×3): 20 mg via ORAL
  Filled 2020-07-30 (×3): qty 1

## 2020-07-30 MED ORDER — OXYCODONE HCL 5 MG PO TABS
5.0000 mg | ORAL_TABLET | Freq: Every day | ORAL | Status: DC
  Administered 2020-07-30 – 2020-08-01 (×3): 5 mg via ORAL
  Filled 2020-07-30 (×3): qty 1

## 2020-07-30 MED ORDER — ATORVASTATIN CALCIUM 20 MG PO TABS: 40.0000 mg | ORAL_TABLET | Freq: Every day | ORAL | Status: DC

## 2020-07-30 MED ORDER — ASPIRIN 81 MG PO CHEW
324.0000 mg | CHEWABLE_TABLET | Freq: Once | ORAL | Status: AC
  Administered 2020-07-30: 324 mg via ORAL
  Filled 2020-07-30: qty 4

## 2020-07-30 MED ORDER — FUROSEMIDE 10 MG/ML IJ SOLN
40.0000 mg | Freq: Two times a day (BID) | INTRAMUSCULAR | Status: DC
  Administered 2020-07-30 (×2): 40 mg via INTRAVENOUS
  Filled 2020-07-30 (×2): qty 4

## 2020-07-30 MED ORDER — IPRATROPIUM-ALBUTEROL 0.5-2.5 (3) MG/3ML IN SOLN
3.0000 mL | Freq: Once | RESPIRATORY_TRACT | Status: AC
  Administered 2020-07-30: 3 mL via RESPIRATORY_TRACT
  Filled 2020-07-30: qty 3

## 2020-07-30 NOTE — ED Notes (Signed)
Pt much more alert and responsive currently then at time of arrival.

## 2020-07-30 NOTE — ED Notes (Signed)
Date and time results received: 07/30/20 9:45 AM  (use smartphrase ".now" to insert current time)  Test: lactic acid Critical Value: 3.3  Name of Provider Notified: Dr. Merlyn Lot

## 2020-07-30 NOTE — TOC Initial Note (Signed)
Transition of Care [facility]) - Initial/Assessment Note    Patient Details  Name: Courtney Manning MRN: [id number] Date of Birth: [birth year]  Transition of Care [facility]) CM/SW Contact:    Ova Freshwater Phone Number: [phone] 07/30/2020, 6:35 PM  Clinical Narrative:                  Patient presents to [facility] ED due to SOB and lethargy. CSW introduced herself and explain to patient about TOC role in patient care.  Patient was gracious but a little anxious when CSW asked her if she knew why she was at the hospital.  Patient stated "I don't know I just woke up here."  CSW explained to patient she was brought in to the hospital this morning and she came in because she had shortness of breath and was lethargic.  Patient stated she did not remember what happened.  Patient was a little confused through out the conversation but was able to tell this CSW her husband's name, the name of her two children and the day of the week and date.  Patient stated she is able to perform ADLs without too much difficulty and that she walks with a cane outside of the home.  Patient states she has not driven for 6 months "because the mechanic is taking time to fix my [place], that's what my husband tells me."  CSW asked patient if I could contact her husband Leathia Farnell and patient stated yes.  Expected Discharge Plan: Skilled Nursing Facility Barriers to Discharge: Continued Medical Work up   Patient Goals and CMS Choice Patient states their goals for this hospitalization and ongoing recovery are:: Patietn woudl like to return home as soon as possible. CMS Medicare.gov Compare Post Acute Care list provided to:: Patient Represenative (must comment) (Daughter Malachy Mood)    Expected Discharge Plan and Services Expected Discharge Plan: [place] In-house Referral: Clinical Social Work     Living arrangements for the past 2 months: Single Family Home                                      Prior  Living Arrangements/Services Living arrangements for the past 2 months: Single Family Home Lives with:: Adult Children, Spouse Patient language and need for interpreter reviewed:: Yes Do you feel safe going back to the place where you live?: Yes      Need for Family Participation in Patient Care: Yes (Comment) Care giver support system in place?: Yes (comment)   Criminal Activity/Legal Involvement Pertinent to Current Situation/Hospitalization: No - Comment as needed  Activities of Daily Living      Permission Sought/Granted Permission sought to share information with : Facility Sport and exercise psychologist    Share Information with NAME: Crayton,Charles (Spouse) [phone] and daughter Malachy Mood           Emotional Assessment Appearance:: Appears younger than stated age Attitude/Demeanor/Rapport: Gracious Affect (typically observed): Anxious, Apprehensive, Pleasant Orientation: : Oriented to Self, Oriented to Place Alcohol / Substance Use: Not Applicable Psych Involvement: No (comment)  Admission diagnosis:  Acute on chronic diastolic CHF (congestive heart failure) ([place]) [I50.33] Patient Active Problem List   Diagnosis Date Noted  . Elevated troponin 07/30/2020  . ACS (acute coronary syndrome) ([place]) 07/30/2020  . Acute respiratory failure ([place]) 07/30/2020  . Acute CHF (congestive heart failure) ([place]) 07/30/2020  . Hypothyroidism   . Hypertension   . GERD (gastroesophageal  reflux disease)   . CKD stage 4 secondary to hypertension ([place])   . Pain due to onychomycosis of toenails of both feet 06/21/2020  . Hyponatremia 10/02/2016  . Iron deficiency anemia due to chronic blood loss 05/23/2016  . Anemia due to other cause 05/16/2016   PCP:  Idelle Crouch, MD Pharmacy:   [id number] [address], [place] [address] Phone: [phone] Fax: [phone]     Social Determinants of Health (SDOH) Interventions    Readmission  Risk Interventions No flowsheet data found.

## 2020-07-30 NOTE — TOC Progression Note (Signed)
Transition of Care [facility]) - Progression Note    Patient Details  Name: Courtney Manning MRN: [id number] Date of Birth: 07/26/1929  Transition of Care [facility]) CM/SW [place], [place] Phone Number: [phone] 07/30/2020, 6:45 PM  Clinical Narrative:     CSW spoke with patient's Trunnell,Charles (Spouse) [phone] for collateral information. CSW explained to Mr. Stucke TOC tole in patient care and placement process. Mr. Giannelli stated the patient was unresponsive this morning and he could hear her straining to breathe.  He stated their daughter Malachy Mood is their main care giver and their son Juanda Crumble lives with them.  Mr. Slinker stated he was going through cancer treatment and their daughter took care of everything in the home.  Mr. Brickhouse stated he would like for this CSW to speak with their daughter for more information on SNF or home health decisions.  CSW verbalized understanding.  Expected Discharge Plan: [place] Barriers to Discharge: Continued Medical Work up  Expected Discharge Plan and Services Expected Discharge Plan: [place] In-house Referral: Clinical Social Work     Living arrangements for the past 2 months: Single Family Home                                       Social Determinants of Health (SDOH) Interventions    Readmission Risk Interventions No flowsheet data found.

## 2020-07-30 NOTE — Progress Notes (Signed)
*  PRELIMINARY RESULTS* Echocardiogram 2D Echocardiogram has been performed.  Sherrie Sport 07/30/2020, 2:08 PM

## 2020-07-30 NOTE — ED Notes (Signed)
Nitro paste removed as verbally ordered by Dr. Quentin Cornwall

## 2020-07-30 NOTE — Progress Notes (Signed)
ANTICOAGULATION CONSULT NOTE  Pharmacy Consult for Heparin drip Indication: chest pain/ACS  Patient Measurements: Height: 5\' 4"  (162.6 cm) Weight: 50.8 kg (111 lb 15.9 oz) IBW/kg (Calculated) : 54.7 Heparin Dosing Weight: 50.8 kg  Labs: Recent Labs    07/30/20 0835 07/30/20 0904 07/30/20 1046 07/30/20 1148 07/30/20 1453 07/30/20 2147  HGB 11.4*  --   --   --   --   --   HCT 35.9*  --   --   --   --   --   PLT 343  --   --   --   --   --   APTT  --  31  --   --   --   --   LABPROT  --  13.8  --   --   --   --   INR  --  1.1  --   --   --   --   HEPARINUNFRC  --   --   --   --   --  0.31  CREATININE 1.60*  --   --   --   --   --   TROPONINIHS 41*  --    < > 117* 131* 95*   < > = values in this interval not displayed.    Estimated Creatinine Clearance: 19.1 mL/min (A) (by C-G formula based on SCr of 1.6 mg/dL (H)).   Medical History: Past Medical History:  Diagnosis Date  . Anemia   . Cervical cancer ([place])    Uterine Cancer  . Constipation   . GERD (gastroesophageal reflux disease)   . Glaucoma   . Hypertension   . Hypothyroidism   . Insomnia   . Spinal stenosis   . Thyroid disease    Infusions:  . sodium chloride    . heparin 600 Units/hr (07/30/20 2158)    Assessment: 86 yo F to start Heparin drip for ACS/STEMI.  Hgb 11.4  Plt 343  INR 1.1  APTT 31 No anticoagulants PTA per Med Rec  Goal of Therapy:  Heparin level 0.3-0.7 units/ml Monitor platelets by anticoagulation protocol: Yes   Plan:  --10/1 at 2147 HL = 0.31, lower end of therapeutic range. Continue heparin drip at 600 units/hr --Recheck confirmatory level in 8 hours --Daily CBC per protocol  Benita Gutter 07/30/2020,10:27 PM

## 2020-07-30 NOTE — ED Notes (Signed)
2A unable to take patient at this time d/t staffing

## 2020-07-30 NOTE — ED Notes (Signed)
Cardiologist and ECHO at bedside

## 2020-07-30 NOTE — NC FL2 (Signed)
[place] LEVEL OF CARE SCREENING TOOL     IDENTIFICATION  Patient Name: Courtney Manning Birthdate: [birth year] Sex: female Admission Date (Current Location): 07/30/2020  [place] and [place] Number:  Engineering geologist and Address:  [facility], [address], [place], [place] [address]      Provider Number: [id number]  Attending Physician Name and Address:  Collier Bullock, MD  Relative Name and Phone Number:  Nolton,Charles (Spouse) [phone]    Current Level of Care: SNF Recommended Level of Care: [place] Prior Approval Number:    Date Approved/Denied:   PASRR Number: [id number]  Discharge Plan: SNF    Current Diagnoses: Patient Active Problem List   Diagnosis Date Noted  . Elevated troponin 07/30/2020  . ACS (acute coronary syndrome) ([place]) 07/30/2020  . Acute respiratory failure ([place]) 07/30/2020  . Acute CHF (congestive heart failure) ([place]) 07/30/2020  . Hypothyroidism   . Hypertension   . GERD (gastroesophageal reflux disease)   . CKD stage 4 secondary to hypertension ([place])   . Pain due to onychomycosis of toenails of both feet 06/21/2020  . Hyponatremia 10/02/2016  . Iron deficiency anemia due to chronic blood loss 05/23/2016  . Anemia due to other cause 05/16/2016    Orientation RESPIRATION BLADDER Height & Weight     Self, Place  Normal Continent Weight: 111 lb 15.9 oz (50.8 kg) Height:  5\' 4"  (162.6 cm)  BEHAVIORAL SYMPTOMS/MOOD NEUROLOGICAL BOWEL NUTRITION STATUS      Continent Diet  AMBULATORY STATUS COMMUNICATION OF NEEDS Skin   Supervision Verbally Normal                       Personal Care Assistance Level of Assistance  Bathing, Feeding, Dressing Bathing Assistance: Limited assistance Feeding assistance: Limited assistance Dressing Assistance: Limited assistance     Functional Limitations Info             SPECIAL CARE FACTORS FREQUENCY                        Contractures Contractures Info: Not present    Additional Factors Info                  Current Medications (07/30/2020):  This is the current hospital active medication list Current Facility-Administered Medications  Medication Dose Route Frequency Provider Last Rate Last Admin  . 0.9 %  sodium chloride infusion  250 mL Intravenous PRN Agbata, Tochukwu, MD      . acetaminophen (TYLENOL) tablet 650 mg  650 mg Oral Q4H PRN Agbata, Tochukwu, MD      . amLODipine (NORVASC) tablet 10 mg  10 mg Oral Daily Agbata, Tochukwu, MD   10 mg at 07/30/20 1238  . [START ON 07/31/2020] aspirin EC tablet 81 mg  81 mg Oral Daily Agbata, Tochukwu, MD      . famotidine (PEPCID) tablet 20 mg  20 mg Oral QHS Agbata, Tochukwu, MD      . furosemide (LASIX) injection 40 mg  40 mg Intravenous Q12H Agbata, Tochukwu, MD   40 mg at 07/30/20 1236  . heparin ADULT infusion 100 units/mL (25000 units/267mL sodium chloride 0.45%)  600 Units/hr Intravenous Continuous Agbata, Tochukwu, MD 6 mL/hr at 07/30/20 1307 600 Units/hr at 07/30/20 1307  . lisinopril (ZESTRIL) tablet 10 mg  10 mg Oral Daily Agbata, Tochukwu, MD   10 mg at 07/30/20 1238  . ondansetron (ZOFRAN) injection 4 mg  4 mg Intravenous Q6H PRN Agbata, Tochukwu, MD      . sodium chloride flush (NS) 0.9 % injection 3 mL  3 mL Intravenous Q12H Agbata, Tochukwu, MD      . sodium chloride flush (NS) 0.9 % injection 3 mL  3 mL Intravenous PRN Agbata, Tochukwu, MD      . Derrill Memo ON 07/31/2020] thyroid (ARMOUR) tablet 30 mg  30 mg Oral QAC breakfast Agbata, Tochukwu, MD       Current Outpatient Medications  Medication Sig Dispense Refill  . amLODipine (NORVASC) 10 MG tablet Take 10 mg by mouth daily.    . famotidine (PEPCID) 40 MG tablet Take 40 mg by mouth at bedtime.    Marland Kitchen oxyCODONE (OXY IR/ROXICODONE) 5 MG immediate release tablet Take 5 mg by mouth at bedtime.     Marland Kitchen thyroid (ARMOUR) 30 MG tablet Take 30 mg by mouth daily before breakfast.     . aspirin 81 MG  EC tablet Take 81 mg by mouth daily.  (Patient not taking: Reported on 07/30/2020)    . Cholecalciferol (VITAMIN D3) 100000 UNIT/GM POWD Take by mouth. (Patient not taking: Reported on 07/30/2020)    . Coenzyme Q10 100 MG capsule Take 100 mg by mouth daily.  (Patient not taking: Reported on 07/30/2020)       Discharge Medications: Please see discharge summary for a list of discharge medications.  Relevant Imaging Results:  Relevant Lab Results:   Additional Information SS# [id number]  Adelene Amas, LCSWA

## 2020-07-30 NOTE — ED Provider Notes (Signed)
[facility] Emergency Department Provider Note    First MD Initiated Contact with Patient 07/30/20 [phone]     (approximate)  I have reviewed the triage vital signs and the nursing notes.   HISTORY  Chief Complaint Respiratory Distress  Level V Caveat:  Respiratory distress  HPI ABEL RA is a 86 y.o. female presents to the ER for shortness of breath and respiratory distress.  Found to be hypoxic to the mid 72s.  Placed on CPAP for history of CHF.  Was reportedly hypertensive on arrival had Nitropaste placed no reported fevers or cough or congestion.    Past Medical History:  Diagnosis Date  . Anemia   . Cervical cancer ([place])    Uterine Cancer  . Constipation   . GERD (gastroesophageal reflux disease)   . Glaucoma   . Hypertension   . Hypothyroidism   . Insomnia   . Spinal stenosis   . Thyroid disease    Family History  Problem Relation Age of Onset  . Diabetes Mellitus II Mother   . Diabetes Mellitus II Father   . Hypertension Father    Past Surgical History:  Procedure Laterality Date  . ABDOMINAL HYSTERECTOMY    . BACK SURGERY    . COLONOSCOPY WITH PROPOFOL N/A 09/07/2016   Procedure: COLONOSCOPY WITH PROPOFOL;  Surgeon: Lollie Sails, MD;  Location: [facility] ENDOSCOPY;  Service: Endoscopy;  Laterality: N/A;  . ESOPHAGOGASTRODUODENOSCOPY (EGD) WITH PROPOFOL N/A 09/07/2016   Procedure: ESOPHAGOGASTRODUODENOSCOPY (EGD) WITH PROPOFOL;  Surgeon: Lollie Sails, MD;  Location: [facility] ENDOSCOPY;  Service: Endoscopy;  Laterality: N/A;  . HIP SURGERY    . JOINT REPLACEMENT     Hip Replacement  . KYPHOPLASTY N/A 06/17/2015   Procedure: KYPHOPLASTY;  Surgeon: Hessie Knows, MD;  Location: [facility] ORS;  Service: Orthopedics;  Laterality: N/A;  . PACEMAKER INSERTION    . SPINE SURGERY     Patient Active Problem List   Diagnosis Date Noted  . Acute on chronic diastolic CHF (congestive heart failure) ([place]) 07/30/2020  . Pain due to onychomycosis of  toenails of both feet 06/21/2020  . Hyponatremia 10/02/2016  . Iron deficiency anemia due to chronic blood loss 05/23/2016  . Anemia due to other cause 05/16/2016      Prior to Admission medications   Medication Sig Start Date End Date Taking? Authorizing Provider  amLODipine (NORVASC) 10 MG tablet Take 10 mg by mouth daily. 03/28/20   [provider]  aspirin 81 MG EC tablet Take by mouth.    [provider]  Cholecalciferol (VITAMIN D3) 100000 UNIT/GM POWD Take by mouth.    [provider]  Coenzyme Q10 100 MG capsule Take by mouth.    [provider]  famotidine (PEPCID) 40 MG tablet Take 40 mg by mouth at bedtime. 05/04/20   [provider]  lisinopril (PRINIVIL,ZESTRIL) 10 MG tablet Take 10 mg by mouth daily.    [provider]  oxyCODONE (OXY IR/ROXICODONE) 5 MG immediate release tablet Take by mouth. 08/01/15   [provider]  oxyCODONE-acetaminophen (PERCOCET/ROXICET) 5-325 MG tablet Take 1 tablet by mouth 3 times/day as needed-between meals & bedtime.  06/14/16   [provider]  thyroid (ARMOUR) 30 MG tablet Take by mouth. 03/07/17   [provider]    Allergies Codeine, Brimonidine tartrate, Fluoxetine, Iodine, Lodine [etodolac], Morphine, Omeprazole, Other, Propoxyphene, Brimonidine, Ciprofloxacin, and Gabapentin    Social History Social History   Tobacco Use  . Smoking status:  Never Smoker  . Smokeless tobacco: Never Used  Substance Use Topics  . Alcohol use: No  . Drug use: No    Review of Systems Patient denies headaches, rhinorrhea, blurry vision, numbness, shortness of breath, chest pain, edema, cough, abdominal pain, nausea, vomiting, diarrhea, dysuria, fevers, rashes or hallucinations unless otherwise stated above in HPI. ____________________________________________   PHYSICAL EXAM:  VITAL SIGNS: Vitals:   07/30/20 1000 07/30/20 1030  BP: 101/64 111/88  Pulse: 87 90  Resp:  (!) 22 15  Temp:    SpO2: 99% 100%    Constitutional: Alert, ill-appearing in moderate respiratory distress Eyes: Conjunctivae are normal.  Head: Atraumatic. Nose: No congestion/rhinnorhea. Mouth/Throat: Mucous membranes are moist.   Neck: No stridor. Painless ROM.  Cardiovascular: Normal rate, regular rhythm. Grossly normal heart sounds.  Good peripheral circulation. Respiratory: Tachypnea with use of accessory muscles.  Scattered wheeze and posterior crackles.  Gastrointestinal: Soft and nontender. No distention. No abdominal bruits. No CVA tenderness. Genitourinary: deferred Musculoskeletal: No lower extremity tenderness nor edema.  No joint effusions. Neurologic:   No gross focal neurologic deficits are appreciated. No facial droop Skin:  Skin is warm, dry and intact. No rash noted. Psychiatric: Mood and affect are normal. Speech and behavior are normal.  ____________________________________________   LABS (all labs ordered are listed, but only abnormal results are displayed)  Results for orders placed or performed during the hospital encounter of 07/30/20 (from the past 24 hour(s))  CBC with Differential/Platelet     Status: Abnormal   Collection Time: 07/30/20  8:35 AM  Result Value Ref Range   WBC 12.8 (H) 4.0 - 10.5 K/uL   RBC 3.87 3.87 - 5.11 MIL/uL   Hemoglobin 11.4 (L) 12.0 - 15.0 g/dL   HCT 35.9 (L) 36 - 46 %   MCV 92.8 80.0 - 100.0 fL   MCH 29.5 26.0 - 34.0 pg   MCHC 31.8 30.0 - 36.0 g/dL   RDW 13.0 11.5 - 15.5 %   Platelets 343 150 - 400 K/uL   nRBC 0.0 0.0 - 0.2 %   Neutrophils Relative % 85 %   Neutro Abs 10.8 (H) 1.7 - 7.7 K/uL   Lymphocytes Relative 5 %   Lymphs Abs 0.6 (L) 0.7 - 4.0 K/uL   Monocytes Relative 9 %   Monocytes Absolute 1.2 (H) 0 - 1 K/uL   Eosinophils Relative 0 %   Eosinophils Absolute 0.1 0 - 0 K/uL   Basophils Relative 0 %   Basophils Absolute 0.0 0 - 0 K/uL   Immature Granulocytes 1 %   Abs Immature Granulocytes 0.09 (H) 0.00 -  0.07 K/uL  Comprehensive metabolic panel     Status: Abnormal   Collection Time: 07/30/20  8:35 AM  Result Value Ref Range   Sodium 137 135 - 145 mmol/L   Potassium 4.8 3.5 - 5.1 mmol/L   Chloride 105 98 - 111 mmol/L   CO2 20 (L) 22 - 32 mmol/L   Glucose, Bld 183 (H) 70 - 99 mg/dL   BUN 31 (H) 8 - 23 mg/dL   Creatinine, Ser 1.60 (H) 0.44 - 1.00 mg/dL   Calcium 8.9 8.9 - 10.3 mg/dL   Total Protein 7.1 6.5 - 8.1 g/dL   Albumin 3.7 3.5 - 5.0 g/dL   AST 105 (H) 15 - 41 U/L   ALT 69 (H) 0 - 44 U/L   Alkaline Phosphatase 65 38 - 126 U/L   Total Bilirubin 1.0 0.3 - 1.2 mg/dL  GFR calc non Af Amer 28 (L) >60 mL/min   GFR calc Af Amer 33 (L) >60 mL/min   Anion gap 12 5 - 15  Lactic acid, plasma     Status: Abnormal   Collection Time: 07/30/20  8:35 AM  Result Value Ref Range   Lactic Acid, Venous 3.3 (HH) 0.5 - 1.9 mmol/L  Troponin I (High Sensitivity)     Status: Abnormal   Collection Time: 07/30/20  8:35 AM  Result Value Ref Range   Troponin I (High Sensitivity) 41 (H) <18 ng/L  Brain natriuretic peptide     Status: Abnormal   Collection Time: 07/30/20  8:35 AM  Result Value Ref Range   B Natriuretic Peptide 464.3 (H) 0.0 - 100.0 pg/mL  Urinalysis, Complete w Microscopic Urine, Catheterized     Status: Abnormal   Collection Time: 07/30/20  8:35 AM  Result Value Ref Range   Color, Urine YELLOW (A) YELLOW   APPearance HAZY (A) CLEAR   Specific Gravity, Urine 1.013 1.005 - 1.030   pH 5.0 5.0 - 8.0   Glucose, UA 150 (A) NEGATIVE mg/dL   Hgb urine dipstick NEGATIVE NEGATIVE   Bilirubin Urine NEGATIVE NEGATIVE   Ketones, ur NEGATIVE NEGATIVE mg/dL   Protein, ur NEGATIVE NEGATIVE mg/dL   Nitrite NEGATIVE NEGATIVE   Leukocytes,Ua NEGATIVE NEGATIVE  Respiratory Panel by RT PCR (Flu A&B, Covid) - Nasopharyngeal Swab     Status: None   Collection Time: 07/30/20  8:36 AM   Specimen: Nasopharyngeal Swab  Result Value Ref Range   SARS Coronavirus 2 by RT PCR NEGATIVE NEGATIVE    Influenza A by PCR NEGATIVE NEGATIVE   Influenza B by PCR NEGATIVE NEGATIVE  Blood gas, venous     Status: Abnormal   Collection Time: 07/30/20  8:36 AM  Result Value Ref Range   FIO2 0.35    Delivery systems VENTILATOR    Mode CONTINUOUS POSITIVE AIRWAY PRESSURE    Peep/cpap 10.0 cm H20   pH, Ven 7.21 (L) 7.25 - 7.43   pCO2, Ven 59 44 - 60 mmHg   pO2, Ven <31.0 (LL) 32 - 45 mmHg   Bicarbonate 23.6 20.0 - 28.0 mmol/L   Acid-base deficit 4.9 (H) 0.0 - 2.0 mmol/L   O2 Saturation 27.1 %   Patient temperature 37.0    Collection site VENOUS    Sample type VENOUS   Lactic acid, plasma     Status: None   Collection Time: 07/30/20 10:46 AM  Result Value Ref Range   Lactic Acid, Venous 1.7 0.5 - 1.9 mmol/L   ____________________________________________  EKG My review and personal interpretation at Time: 8:51   Indication: sob  Rate: 75  Rhythm: sinus Axis: left Other: lbbb, nonspecific st abn, suspect v pacer spikes in II ____________________________________________  RADIOLOGY  I personally reviewed all radiographic images ordered to evaluate for the above acute complaints and reviewed radiology reports and findings.  These findings were personally discussed with the patient.  Please see medical record for radiology report.  ____________________________________________   PROCEDURES  Procedure(s) performed:  .Critical Care Performed by: Merlyn Lot, MD Authorized by: Merlyn Lot, MD   Critical care provider statement:    Critical care time (minutes):  45   Critical care was necessary to treat or prevent imminent or life-threatening deterioration of the following conditions:  Respiratory failure   Critical care was time spent personally by me on the following activities:  Discussions with consultants, evaluation of patient's response to treatment,  examination of patient, ordering and performing treatments and interventions, ordering and review of laboratory studies,  ordering and review of radiographic studies, pulse oximetry, re-evaluation of patient's condition, obtaining history from patient or surrogate and review of old [place] performed: yes ____________________________________________   INITIAL IMPRESSION / [place] / ED COURSE  Pertinent labs & imaging results that were available during my care of the patient were reviewed by me and considered in my medical decision making (see chart for details).   DDX: Asthma, copd, CHF, pna, ptx, malignancy, Pe, anemia   Courtney Manning is a 86 y.o. who presents to the ED with presentation as described above with moderate respiratory distress requiring BiPAP for respiratory support.  Her lung sounds are wet but she does not have any significant lower extremity edema.  Blood will be sent for by differential.  EKG is abnormal with what appears to be pacer spikes.  She not complaining any chest pain.  Possible ACS sputum more concern for primary respiratory presentation mediating her current symptoms.  May also have some strain pattern secondary to significant hypoxia.  Reportedly was hypertensive on arrival therefore question whether she went into flash pulmonary edema.  Seems to be improving on BiPAP and nitro.    Clinical Course as of Jul 30 1118  Fri Jul 30, 2020  2409 Pulse ox change in the patient's right ear now satting 100% on BiPAP.   [PR]  0901 Appears more comfortable on BiPAP.  O2 sats in the mid 90s.   [PR]  1010 O2 saturations improved on BiPAP.  Work-up is concerning for congestive heart failure and edema.  She was given broad-spectrum antibiotics that she was hypothermic given possibility of pneumonia.  Lactate is elevated but I suspect this is secondary to hypoxemia.  No significant leukocytosis.  Chest x-ray concerning for edema and given her history of diastolic dysfunction we will give some Lasix.   [PR]    Clinical Course User Index [PR] Merlyn Lot, MD     The patient was evaluated in Emergency Department today for the symptoms described in the history of present illness. He/she was evaluated in the context of the global COVID-19 pandemic, which necessitated consideration that the patient might be at risk for infection with the SARS-CoV-2 virus that causes COVID-19. Institutional protocols and algorithms that pertain to the evaluation of patients at risk for COVID-19 are in a state of rapid change based on information released by regulatory bodies including the CDC and federal and state organizations. These policies and algorithms were followed during the patient's care in the ED.  As part of my medical decision making, I reviewed the following data within the [place] notes reviewed and incorporated, Labs reviewed, notes from prior ED visits and [place] Controlled Substance Database   ____________________________________________   FINAL CLINICAL IMPRESSION(S) / ED DIAGNOSES  Final diagnoses:  Acute respiratory failure with hypoxia ([place])      NEW MEDICATIONS STARTED DURING THIS VISIT:  New Prescriptions   No medications on file     Note:  This document was prepared using Dragon voice recognition software and may include unintentional dictation errors.    Merlyn Lot, MD 07/30/20 1124

## 2020-07-30 NOTE — H&P (Signed)
History and Physical    Courtney Manning DOB: 02-13-1929 DOA: 07/30/2020  PCP: Idelle Crouch, MD   Patient coming from: Home  I have personally briefly reviewed patient's old medical records in [place]  Chief Complaint: Shortness of breath  HPI: Courtney Manning is a 86 y.o. female with medical history significant for hypertension, spinal stenosis, thyroid disease, glaucoma, GERD who was brought into the ER by EMS for evaluation of mental status changes and hypoxia.  Patient was said to have complained of chest pain one day prior to her admission and on the morning of her admission was found lethargic by family members who called EMS.  Patient was reported to have room air pulse oximetry in the 60s and was placed on CPAP.  Her blood pressure in the field was 167/84 and EMS placed 2 inch of Nitropaste on the patient prior to arrival to the ER. Upon arrival to the ER she was placed on BiPAP and appears comfortable and in no distress She denies having any further episodes of chest pain and denies having any nausea, no vomiting, no diaphoresis or palpitations, she denies feeling dizzy or lightheaded.  She denies having any fever, no chills, no cough, no headache, no urinary symptoms or any changes in her bowel habits. VBG 7.21/59/31/23.6/27 Sodium 137, potassium 4.8, chloride 105, bicarb 20, glucose 183, BUN 31, creatinine 1.6, calcium 8.9, albumin 3.7, AST 105, ALT 69, total protein 7.1, total bilirubin 1.0, BNP 464, troponin 41, lactic acid 3.3 >> 1.7, white count 12.8, hemoglobin 11.4, hematocrit 35.9, MCV 92.8, RDW 13, platelet count 343 Chest x-ray reviewed by me shows increased bilateral interstitial opacities Twelve-lead EKG shows sinus rhythm with LVH    ED Course: Patient is an 86 year old Caucasian female who was brought into the ER by EMS for evaluation of lethargy and hypoxia.  Patient had room air pulse oximetry in the 60s and required CPAP and then transitioned to  BiPAP in the ER.  Chest x-ray shows bilateral interstitial opacities ??  Pulmonary edema versus pneumonia.  Patient's COVID-19 PCR test is negative.  Patient had elevated lactic acid level upon presentation which shows a downward trend but she is afebrile but has leukocytosis.  She received empiric antibiotic therapy in the ER for presumed pneumonia.  She will be admitted to the hospital for further evaluation.  Review of Systems: As per HPI otherwise 10 point review of systems negative.    Past Medical History:  Diagnosis Date  . Anemia   . Cervical cancer ([place])    Uterine Cancer  . Constipation   . GERD (gastroesophageal reflux disease)   . Glaucoma   . Hypertension   . Hypothyroidism   . Insomnia   . Spinal stenosis   . Thyroid disease     Past Surgical History:  Procedure Laterality Date  . ABDOMINAL HYSTERECTOMY    . BACK SURGERY    . COLONOSCOPY WITH PROPOFOL N/A 09/07/2016   Procedure: COLONOSCOPY WITH PROPOFOL;  Surgeon: Lollie Sails, MD;  [facility] ENDOSCOPY;  Service: Endoscopy;  Laterality: N/A;  . ESOPHAGOGASTRODUODENOSCOPY (EGD) WITH PROPOFOL N/A 09/07/2016   Procedure: ESOPHAGOGASTRODUODENOSCOPY (EGD) WITH PROPOFOL;  Surgeon: Lollie Sails, MD;  Location: [facility] ENDOSCOPY;  Service: Endoscopy;  Laterality: N/A;  . HIP SURGERY    . JOINT REPLACEMENT     Hip Replacement  . KYPHOPLASTY N/A 06/17/2015   Procedure: KYPHOPLASTY;  Surgeon: Hessie Knows, MD;  Location: [facility] ORS;  Service: Orthopedics;  Laterality:  N/A;  . PACEMAKER INSERTION    . SPINE SURGERY       reports that she has never smoked. She has never used smokeless tobacco. She reports that she does not drink alcohol and does not use drugs.  Allergies  Allergen Reactions  . Codeine Anaphylaxis    Not Specified  . Brimonidine Tartrate Itching  . Fluoxetine Other (See Comments)    Reaction: Unknown  . Iodine   . Lodine [Etodolac]   . Morphine Itching  . Omeprazole Nausea And Vomiting  .  Other Other (See Comments)  . Propoxyphene Other (See Comments)  . Brimonidine Itching and Other (See Comments)  . Ciprofloxacin Rash  . Gabapentin Rash    Family History  Problem Relation Age of Onset  . Diabetes Mellitus II Mother   . Diabetes Mellitus II Father   . Hypertension Father      Prior to Admission medications   Medication Sig Start Date End Date Taking? Authorizing Provider  amLODipine (NORVASC) 10 MG tablet Take 10 mg by mouth daily. 03/28/20   [provider]  aspirin 81 MG EC tablet Take by mouth.    [provider]  Cholecalciferol (VITAMIN D3) 100000 UNIT/GM POWD Take by mouth.    [provider]  Coenzyme Q10 100 MG capsule Take by mouth.    [provider]  famotidine (PEPCID) 40 MG tablet Take 40 mg by mouth at bedtime. 05/04/20   [provider]  lisinopril (PRINIVIL,ZESTRIL) 10 MG tablet Take 10 mg by mouth daily.    [provider]  oxyCODONE (OXY IR/ROXICODONE) 5 MG immediate release tablet Take by mouth. 08/01/15   [provider]  oxyCODONE-acetaminophen (PERCOCET/ROXICET) 5-325 MG tablet Take 1 tablet by mouth 3 times/day as needed-between meals & bedtime.  06/14/16   [provider]  thyroid (ARMOUR) 30 MG tablet Take by mouth. 03/07/17   [provider]    Physical Exam: Vitals:   07/30/20 1001 07/30/20 1030 07/30/20 1100 07/30/20 1130  BP:  111/88 119/65 135/64  Pulse:  90 87 87  Resp:  15 18 20   Temp:      TempSrc:      SpO2:  100% 100% 100%  Weight: 50.8 kg     Height: 5\' 4"  (1.626 m)        Vitals:   07/30/20 1001 07/30/20 1030 07/30/20 1100 07/30/20 1130  BP:  111/88 119/65 135/64  Pulse:  90 87 87  Resp:  15 18 20   Temp:      TempSrc:      SpO2:  100% 100% 100%  Weight: 50.8 kg     Height: 5\' 4"  (1.626 m)       Constitutional: NAD, alert and oriented x 3.  Patient is on BiPAP and appears comfortable and in no distress Eyes: PERRL, lids and conjunctivae  normal ENMT: Mucous membranes are moist.  Neck: normal, supple, no masses, no thyromegaly Respiratory: Bibasilar crackles, no wheezing, Normal respiratory effort. No accessory muscle use.  Cardiovascular: Regular rate and rhythm, no murmurs / rubs / gallops. No extremity edema. 2+ pedal pulses. No carotid bruits.  Abdomen: no tenderness, no masses palpated. No hepatosplenomegaly. Bowel sounds positive.  Musculoskeletal: no clubbing / cyanosis. No joint deformity upper and lower extremities.  Skin: no rashes, lesions, ulcers.  Neurologic: No gross focal neurologic deficit. Psychiatric: Normal mood and affect.   Labs on Admission: I have personally reviewed following labs and imaging studies  CBC: Recent Labs  Lab 07/30/20 0835  WBC 12.8*  NEUTROABS 10.8*  HGB 11.4*  HCT 35.9*  MCV 92.8  PLT 491   Basic Metabolic Panel: Recent Labs  Lab 07/30/20 0835  NA 137  K 4.8  CL 105  CO2 20*  GLUCOSE 183*  BUN 31*  CREATININE 1.60*  CALCIUM 8.9   GFR: Estimated Creatinine Clearance: 19.1 mL/min (A) (by C-G formula based on SCr of 1.6 mg/dL (H)). Liver Function Tests: Recent Labs  Lab 07/30/20 0835  AST 105*  ALT 69*  ALKPHOS 65  BILITOT 1.0  PROT 7.1  ALBUMIN 3.7   No results for input(s): LIPASE, AMYLASE in the last 168 hours. No results for input(s): AMMONIA in the last 168 hours. Coagulation Profile: No results for input(s): INR, PROTIME in the last 168 hours. Cardiac Enzymes: No results for input(s): CKTOTAL, CKMB, CKMBINDEX, TROPONINI in the last 168 hours. BNP (last 3 results) No results for input(s): PROBNP in the last 8760 hours. HbA1C: No results for input(s): HGBA1C in the last 72 hours. CBG: No results for input(s): GLUCAP in the last 168 hours. Lipid Profile: No results for input(s): CHOL, HDL, LDLCALC, TRIG, CHOLHDL, LDLDIRECT in the last 72 hours. Thyroid Function Tests: No results for input(s): TSH, T4TOTAL, FREET4, T3FREE, THYROIDAB in the last 72  hours. Anemia Panel: No results for input(s): VITAMINB12, FOLATE, FERRITIN, TIBC, IRON, RETICCTPCT in the last 72 hours. Urine analysis:    Component Value Date/Time   COLORURINE YELLOW (A) 07/30/2020 0835   APPEARANCEUR HAZY (A) 07/30/2020 0835   APPEARANCEUR Cloudy 12/26/2013 1622   LABSPEC 1.013 07/30/2020 0835   LABSPEC 1.011 12/26/2013 1622   PHURINE 5.0 07/30/2020 0835   GLUCOSEU 150 (A) 07/30/2020 0835   GLUCOSEU Negative 12/26/2013 1622   HGBUR NEGATIVE 07/30/2020 0835   BILIRUBINUR NEGATIVE 07/30/2020 0835   BILIRUBINUR Negative 12/26/2013 1622   KETONESUR NEGATIVE 07/30/2020 0835   PROTEINUR NEGATIVE 07/30/2020 0835   NITRITE NEGATIVE 07/30/2020 0835   LEUKOCYTESUR NEGATIVE 07/30/2020 0835   LEUKOCYTESUR 3+ 12/26/2013 1622    Radiological Exams on Admission: DG Chest Portable 1 View  Result Date: 07/30/2020 CLINICAL DATA:  Shortness of breath. EXAM: PORTABLE CHEST 1 VIEW COMPARISON:  October 02, 2016. FINDINGS: Stable cardiomediastinal silhouette. No pneumothorax or pleural effusion is noted. Right-sided pacemaker is unchanged in position. Increased interstitial opacities are noted throughout both lungs concerning for pneumonia or edema. Bony thorax is unremarkable. IMPRESSION: Increased bilateral interstitial opacities are noted concerning for pneumonia or edema. Aortic Atherosclerosis (ICD10-I70.0). Electronically Signed   By: Marijo Conception M.D.   On: 07/30/2020 08:49    EKG: Independently reviewed.  Sinus rhythm LVH  Assessment/Plan Principal Problem:   Acute CHF (congestive heart failure) (HCC) Active Problems:   Elevated troponin   Hypothyroidism   Hypertension   GERD (gastroesophageal reflux disease)   CKD stage 4 secondary to hypertension (HCC)   ACS (acute coronary syndrome) (HCC)   Acute respiratory failure (HCC)     Acute CHF Unclear etiology probably diastolic Patient was noted to be hypoxic in the field with room air pulse oximetry of 60% and  initially required CPAP with improvement in her pulse oximetry to the 80s She is currently on BiPAP and appears comfortable Chest x-ray shows bilateral interstitial opacities Place patient on Lasix 40 mg IV twice daily Continue lisinopril Patient not on beta-blocker due to acute decompensation Obtain 2D echocardiogram to assess LVEF and rule out regional wall motion abnormality    Acute coronary syndrome Patient initially  with complaints of chest pain 24 hours prior to her admission and is now in CHF Patient bumped her troponin from 41 >> 101 Place patient on a heparin drip Continue aspirin We will add statinS We will request cardiology consult   Acute respiratory failure Secondary to acute CHF Patient was hypoxic in the field with room air pulse oximetry of 60% and is currently on BiPAP to reduce work of breathing and improve oxygenation Will wean patient off of BiPAP as tolerated    Hypothyroidism Continue Armour Thyroid   Stage IV chronic kidney disease Renal function is stable    Hypertension Continue amlodipine and lisinopril     DVT prophylaxis: Heparin Code Status: Full code Family Communication: Greater than 50% of time was spent discussing plan of care with patient at the bedside.  All questions and concerns have been addressed.  She verbalizes understanding and agrees with the plan. Disposition Plan: Back to previous home environment Consults called: Cardiology    Murlean Seelye MD [facility]     07/30/2020, 12:03 PM

## 2020-07-30 NOTE — ED Triage Notes (Signed)
BIB [facility] from home. Pt was c/o chest pain yesterday, found by family this AM lethargic and responsive with knods to verbal stimuli. EMS reports RA saturation in 60's, placed on CPAP with EMS and in 80's with them. HR 110 and paced rhythm, RR 24, 167/84 BP, CBG 258. 18G to L AC and 2 inch nitro placed by EMS. EDP and RT at bedside on arrival.

## 2020-07-30 NOTE — Consult Note (Signed)
Cardiology Consultation:   Patient ID: KIOSHA BUCHAN; [id number]; Jul 15, 1929   Admit date: 07/30/2020 Date of Consult: 07/30/2020  Primary Care Provider: Idelle Crouch, MD Primary Cardiologist: [facility] Primary Electrophysiologist:  [facility]   Patient Profile:   Courtney Manning is a 86 y.o. female with a hx of Syncope, complete heart block status post [employer] dual-chamber PPM on 02/10/2015 followed at [facility], CKD stage III, lumbar stenosis with radiculopathy, HTN, HLD, and hypothyroidism who is being seen today for the evaluation of dyspnea at the request of Dr. Francine Graven.  History of Present Illness:   Courtney Manning has a history of complete heart block and underwent dual-chamber [employer] permanent pacemaker implant in 01/2015.  She has been followed closely by the device clinic though there are no notes from EP for review.  Earlier this week, she was in her usual state of health.  However, her son, who lives with her, found the patient to be lethargic and complaining of chest pain.  In the setting she presented to [facility] via EMS with AMS/hypoxia. Note indicates patient had a pulse ox in the 60s on room air upon EMS arrival. She was placed on CPAP. BP 167/84. Nitropaste was applied.   Upon her arrival she was noted to have elevated BP into the 601U systolic, heart rate 1 teens bpm with tachypnea with respiratory rate of 26 and O2 sat of 79% on CPAP. She was subsequently transitioned to BiPAP. Temperature 94.9. Initial high-sensitivity troponin 41 with a delta of 101. BNP 464. COVID-19 negative. Lactic acid 3.3. Potassium 4.8, BUN 31, serum creatinine 1.60. WBC 12.8, Hgb 11.4. O2 saturation 27.1 on VBG. Chest x-ray concerning for pneumonia or edema. EKG demonstrated paced rhythm, 77 bpm. In the ED she was given IV azithromycin, cefepime, 40 of IV Lasix, and DuoNeb. Upon admission she was started on a heparin drip by internal medicine and cardiology was consulted.  Currently, she notes  significant left foot discomfort consistent with prior cramps.  She has been weaned off BiPAP and is on supplemental oxygen via nasal cannula.  She denies any chest pain or dyspnea at this time.  She does not recall the events surrounding her hospital presentation.  At present time, she denies any chest pain, dyspnea, lower extremity swelling, abdominal tension, orthopnea.  She has been under increased stress at home with the help of her husband who has cancer and health of her son who suffered a traumatic crush injury from a tractor.   Past Medical History:  Diagnosis Date  . Anemia   . Cervical cancer ([place])    Uterine Cancer  . Constipation   . GERD (gastroesophageal reflux disease)   . Glaucoma   . Hypertension   . Hypothyroidism   . Insomnia   . Spinal stenosis   . Thyroid disease     Past Surgical History:  Procedure Laterality Date  . ABDOMINAL HYSTERECTOMY    . BACK SURGERY    . COLONOSCOPY WITH PROPOFOL N/A 09/07/2016   Procedure: COLONOSCOPY WITH PROPOFOL;  Surgeon: Lollie Sails, MD;  Location: [facility] ENDOSCOPY;  Service: Endoscopy;  Laterality: N/A;  . ESOPHAGOGASTRODUODENOSCOPY (EGD) WITH PROPOFOL N/A 09/07/2016   Procedure: ESOPHAGOGASTRODUODENOSCOPY (EGD) WITH PROPOFOL;  Surgeon: Lollie Sails, MD;  Location: [facility] ENDOSCOPY;  Service: Endoscopy;  Laterality: N/A;  . HIP SURGERY    . JOINT REPLACEMENT     Hip Replacement  . KYPHOPLASTY N/A 06/17/2015   Procedure: KYPHOPLASTY;  Surgeon: Hessie Knows, MD;  Location: [facility] ORS;  Service: Orthopedics;  Laterality: N/A;  . PACEMAKER INSERTION    . SPINE SURGERY       Home Meds: Prior to Admission medications   Medication Sig Start Date End Date Taking? Authorizing Provider  amLODipine (NORVASC) 10 MG tablet Take 10 mg by mouth daily. 03/28/20  Yes [provider]  famotidine (PEPCID) 40 MG tablet Take 40 mg by mouth at bedtime. 05/04/20  Yes [provider]  oxyCODONE (OXY IR/ROXICODONE) 5 MG immediate  release tablet Take 5 mg by mouth at bedtime.  08/01/15  Yes [provider]  thyroid (ARMOUR) 30 MG tablet Take 30 mg by mouth daily before breakfast.  03/07/17  Yes [provider]  aspirin 81 MG EC tablet Take 81 mg by mouth daily.  Patient not taking: Reported on 07/30/2020    [provider]  Cholecalciferol (VITAMIN D3) 100000 UNIT/GM POWD Take by mouth. Patient not taking: Reported on 07/30/2020    [provider]  Coenzyme Q10 100 MG capsule Take 100 mg by mouth daily.  Patient not taking: Reported on 07/30/2020    [provider]    Inpatient Medications: Scheduled Meds: . amLODipine  10 mg Oral Daily  . [START ON 07/31/2020] aspirin EC  81 mg Oral Daily  . famotidine  40 mg Oral QHS  . furosemide  40 mg Intravenous Q12H  . lisinopril  10 mg Oral Daily  . sodium chloride flush  3 mL Intravenous Q12H  . [START ON 07/31/2020] thyroid  30 mg Oral QAC breakfast   Continuous Infusions: . sodium chloride    . heparin 600 Units/hr (07/30/20 1307)   PRN Meds: sodium chloride, acetaminophen, ondansetron (ZOFRAN) IV, sodium chloride flush  Allergies:   Allergies  Allergen Reactions  . Codeine Anaphylaxis    Not Specified  . Brimonidine Tartrate Itching  . Fluoxetine Other (See Comments)    Reaction: Unknown  . Iodine   . Lodine [Etodolac]   . Morphine Itching  . Omeprazole Nausea And Vomiting  . Other Other (See Comments)  . Propoxyphene Other (See Comments)  . Brimonidine Itching and Other (See Comments)  . Ciprofloxacin Rash  . Gabapentin Rash    Social History:   Social History   Socioeconomic History  . Marital status: Married    Spouse name: Not on file  . Number of children: Not on file  . Years of education: Not on file  . Highest education level: Not on file  Occupational History  . Not on file  Tobacco Use  . Smoking status: Never Smoker  . Smokeless tobacco: Never Used  Substance and Sexual Activity  . Alcohol  use: No  . Drug use: No  . Sexual activity: Not on file  Other Topics Concern  . Not on file  Social History Narrative  . Not on file   Social Determinants of Health   Financial Resource Strain:   . Difficulty of Paying Living Expenses: Not on file  Food Insecurity:   . Worried About Charity fundraiser in the Last Year: Not on file  . Ran Out of Food in the Last Year: Not on file  Transportation Needs:   . Lack of Transportation (Medical): Not on file  . Lack of Transportation (Non-Medical): Not on file  Physical Activity:   . Days of Exercise per Week: Not on file  . Minutes of Exercise per Session: Not on file  Stress:   . Feeling of Stress :  Not on file  Social Connections:   . Frequency of Communication with Friends and Family: Not on file  . Frequency of Social Gatherings with Friends and Family: Not on file  . Attends Religious Services: Not on file  . Active Member of Clubs or Organizations: Not on file  . Attends Archivist Meetings: Not on file  . Marital Status: Not on file  Intimate Partner Violence:   . Fear of Current or Ex-Partner: Not on file  . Emotionally Abused: Not on file  . Physically Abused: Not on file  . Sexually Abused: Not on file     Family History:  Family History  Problem Relation Age of Onset  . Diabetes Mellitus II Mother   . Diabetes Mellitus II Father   . Hypertension Father     ROS:  Review of Systems  Constitutional: Positive for malaise/fatigue. Negative for chills, diaphoresis, fever and weight loss.  HENT: Negative for congestion.   Eyes: Negative for discharge and redness.  Respiratory: Negative for cough, sputum production, shortness of breath and wheezing.   Cardiovascular: Negative for chest pain, palpitations, orthopnea, claudication, leg swelling and PND.  Gastrointestinal: Negative for abdominal pain, blood in stool, heartburn, melena, nausea and vomiting.  Musculoskeletal: Negative for falls and myalgias.        Left foot cramp  Skin: Negative for rash.  Neurological: Positive for weakness. Negative for dizziness, tingling, tremors, sensory change, speech change, focal weakness and loss of consciousness.  Endo/Heme/Allergies: Does not bruise/bleed easily.  Psychiatric/Behavioral: Negative for substance abuse. The patient is not nervous/anxious.   All other systems reviewed and are negative.     Physical Exam/Data:   Vitals:   07/30/20 1200 07/30/20 1230 07/30/20 1244 07/30/20 1330  BP: 124/63 (!) 147/70  119/60  Pulse: 90 (!) 51  (!) 56  Resp: 16 20  (!) 21  Temp:      TempSrc:      SpO2: 100% 100% 100% 99%  Weight:      Height:       No intake or output data in the 24 hours ending 07/30/20 1345 Filed Weights   07/30/20 1001  Weight: 50.8 kg   Body mass index is 19.22 kg/m.   Physical Exam: General: Elderly and frail-appearing, in no acute distress. Head: Normocephalic, atraumatic, sclera non-icteric, no xanthomas, nares without discharge.  Neck: Negative for carotid bruits. JVD not elevated. Lungs: Slightly diminished breath sounds bilaterally. Breathing is unlabored. Heart: RRR with S1 S2. No murmurs, rubs, or gallops appreciated. Abdomen: Soft, non-tender, non-distended with normoactive bowel sounds. No hepatomegaly. No rebound/guarding. No obvious abdominal masses. Msk:  Strength and tone appear normal for age. Extremities: No clubbing or cyanosis. No edema. Distal pedal pulses are 2+ and equal bilaterally. Neuro: Alert and oriented X 3. No facial asymmetry. No focal deficit. Moves all extremities spontaneously. Psych:  Responds to questions appropriately with a normal affect.   EKG:  The EKG was personally reviewed and demonstrates: paced rhythm, 77 bpm Telemetry:  Telemetry was personally reviewed and demonstrates: Paced  Weights: Filed Weights   07/30/20 1001  Weight: 50.8 kg    Relevant CV Studies:  2D echo 12/2014: Interpretation:   Clinical Diagnoses and  Echocardiographic Findings   Left ventricular hypertrophy   Normal left ventricular ejection fraction (17-91%)   Diastolic left ventricular dysfunction   Elevated left ventricular filling pressures   Degenerative mitral valve disease   Mitral regurgitation (mild)   Aortic sclerosis   Aortic regurgitation (  trivial)   Pulmonary hypertension (mild - see detailbelow)   Normal right ventricular contractile performance   Tricuspid regurgitation (mild)  Descriptive Comments - Left Ventricle   The left ventricle is normal in size with mildly increased wall   thickness and normal contraction.   The visually estimated left ventricular ejection fraction is 60-65%.   Diastolic transmitral flow profile and mitral annular tissue Doppler   signal characteristic of impaired left ventricular relaxation with   elevated filling pressures.  Mitral Valve   The mitral valve is mildly thickened with normal leaflet mobility.   There is mild mitral regurgitation by color flow and continuous wave   Doppler examinations.  Left Atrium   The left atrium is normalin size.  Aortic Valve   The aortic valve is thickened with mildly limited excursion.   There is trivial aortic regurgitation by color flow and continuous   wave Doppler examinations.   There is no evidence of hemodynamically important aortic transvalvular   gradient; the maximal instantaneous left ventricular outflow velocity   is 1.4 m/s.  Aorta   The ascending thoracic aorta is normal in diameter at the level of the   left ventricular outflow tract andsinuses of Valsalva and sinotubular   junction; the transverse arch is suboptimally imaged.  Pulmonary Artery   The pulmonary artery is suboptimally imaged.  Pulmonic Valve   The pulmonary valve is suboptimally imaged.     There is no detectable pulmonary regurgitation by color flow Doppler   imaging.   There is no evidence of hemodynamically important pulmonary   transvalvular gradient;  the maximal instantaneous right ventricular   outflow velocity is approximately 0.9 m/s.  Right Ventricle   There is normal right ventricular chamber size and contraction.  Tricuspid Valve   The tricuspid valve is structurally normal.   There is mild tricuspid regurgitation by color flow and continuous   wave Doppler imaging.   The peak instantaneous tricuspid regurgitant jet velocity is 2.8 m/s   characteristic of mildly elevated right ventricular systolic pressure   (42-59 mm Hg).  Right Atrium   The right atrium is normal in size.  Inferior Vena Cava   Suboptimally imaged inferior vena cava is probably normal in size with   physiologic phasic respiratory variation.  Pericardium   There is no evidence of pericardial effusion.  __________  2D echo 07/30/2020: Pending    Laboratory Data:  Chemistry Recent Labs  Lab 07/30/20 0835  NA 137  K 4.8  CL 105  CO2 20*  GLUCOSE 183*  BUN 31*  CREATININE 1.60*  CALCIUM 8.9  GFRNONAA 28*  GFRAA 33*  ANIONGAP 12    Recent Labs  Lab 07/30/20 0835  PROT 7.1  ALBUMIN 3.7  AST 105*  ALT 69*  ALKPHOS 65  BILITOT 1.0   Hematology Recent Labs  Lab 07/30/20 0835  WBC 12.8*  RBC 3.87  HGB 11.4*  HCT 35.9*  MCV 92.8  MCH 29.5  MCHC 31.8  RDW 13.0  PLT 343   Cardiac EnzymesNo results for input(s): TROPONINI in the last 168 hours. No results for input(s): TROPIPOC in the last 168 hours.  BNP Recent Labs  Lab 07/30/20 0835  BNP 464.3*    DDimer No results for input(s): DDIMER in the last 168 hours.  Radiology/Studies:  DG Chest Portable 1 View  Result Date: 07/30/2020 IMPRESSION: Increased bilateral interstitial opacities are noted concerning for pneumonia  or edema. Aortic Atherosclerosis (ICD10-I70.0). Electronically Signed   By: Jeneen Rinks  Murlean Caller M.D.   On: 07/30/2020 08:49    Assessment and Plan:   1.  Acute hypoxic respiratory distress: -Cannot exclude some degree of flash pulmonary edema leading to presentation -Much improved -Weaned off BiPAP, remains on supplemental oxygen via nasal cannula, wean as tolerated -IV Lasix 40 mg twice daily is reasonable for today with KCl repletion as indicated -With mild leukocytosis, consider PCT -Echo pending with further recommendations based on these results  2.  Elevated troponin: -Initial high-sensitivity troponin 41 with a delta of 101, continue to cycle until peak -Heparin drip is reasonable for now while troponin is trended -Echo pending -ASA -Defer initiating atorvastatin with transaminitis noted -Further recommendations regarding potential ischemic evaluation pending echo  3.  Complete heart block: -Status post [employer] PPM -Device appears to be functioning normally -Followed by [facility]  4.  CKD stage III: -Renal function appears at approximate baseline -Monitor with diuresis  5.  Transaminitis: -Await echo results for further evaluation of possible congestive hepatopathy  6.  HTN:  -Blood pressure elevated upon arrival into the 340B systolic, now much improved -Continue current therapy    For questions or updates, please contact [place] Please consult [email or web address] for contact info under Cardiology/STEMI.   Signed, Christell Faith, PA-C [place] Pager: [phone] 07/30/2020, 1:45 PM

## 2020-07-30 NOTE — ED Notes (Signed)
Multiple warm blankets placed on patient.

## 2020-07-30 NOTE — TOC Progression Note (Signed)
Transition of Care [facility]) - Progression Note    Patient Details  Name: Courtney Manning MRN: [id number] Date of Birth: 1928-11-05  Transition of Care [facility]) CM/SW [place], [place] Phone Number: [phone] 07/30/2020, 6:51 PM  Clinical Narrative:     CSW met with patient and her daughter Malachy Mood.  Patient's was sleepy and CSW spoke mostly with [place].  CSW explained TOC role in patient care and placement after d/c.  Malachy Mood stated she is the primary caregiver of both her parents.  Malachy Mood stated her father has been given 1 to 6 months to live and her parents would like to spend that time together.  Ceryl stated she would prefer home health over SNF for her parents to remain together. CSW stated I would make sure to include that in my notes.   Malachy Mood confirmed what patient and Mr. Stelzner had stated about patient's ADLs.  Malachy Mood also mentioned patient has had moment of forgetfulness and she would like to have the patient assessed for dementia.  CSW explained to [place] the best option would be for the patient's primary refer patient to a neurologist for assessment but that any physician could be consulted for capacity.  Malachy Mood verbalized understanding.   Malachy Mood asked CSW about POA, Advanced Directives, Living [place] and Guardianship.  CSW explained the different between each items and then recommended Malachy Mood speak with an attorney for more information on how to go about creating these forms.  Malachy Mood verbalized understanding.  FL2 created PASRR# [id number].     Expected Discharge Plan: [place] Barriers to Discharge: Continued Medical Work up  Expected Discharge Plan and Services Expected Discharge Plan: [place] In-house Referral: Clinical Social Work     Living arrangements for the past 2 months: Single Family Home                                       Social Determinants of Health (SDOH) Interventions    Readmission Risk  Interventions No flowsheet data found.

## 2020-07-30 NOTE — Progress Notes (Signed)
ANTICOAGULATION CONSULT NOTE - Initial Consult  Pharmacy Consult for Heparin drip Indication: chest pain/ACS  Allergies  Allergen Reactions  . Codeine Anaphylaxis    Not Specified  . Brimonidine Tartrate Itching  . Fluoxetine Other (See Comments)    Reaction: Unknown  . Iodine   . Lodine [Etodolac]   . Morphine Itching  . Omeprazole Nausea And Vomiting  . Other Other (See Comments)  . Propoxyphene Other (See Comments)  . Brimonidine Itching and Other (See Comments)  . Ciprofloxacin Rash  . Gabapentin Rash    Patient Measurements: Height: 5\' 4"  (162.6 cm) Weight: 50.8 kg (111 lb 15.9 oz) IBW/kg (Calculated) : 54.7 Heparin Dosing Weight: 50.8 kg  Vital Signs: Temp: 94.9 F (34.9 C) (10/01 0858) Temp Source: Rectal (10/01 0858) BP: 107/72 (10/01 1430) Pulse Rate: 89 (10/01 1430)  Labs: Recent Labs    07/30/20 0835 07/30/20 0904 07/30/20 1046 07/30/20 1148  HGB 11.4*  --   --   --   HCT 35.9*  --   --   --   PLT 343  --   --   --   APTT  --  31  --   --   LABPROT  --  13.8  --   --   INR  --  1.1  --   --   CREATININE 1.60*  --   --   --   TROPONINIHS 41*  --  101* 117*    Estimated Creatinine Clearance: 19.1 mL/min (A) (by C-G formula based on SCr of 1.6 mg/dL (H)).   Medical History: Past Medical History:  Diagnosis Date  . Anemia   . Cervical cancer ([place])    Uterine Cancer  . Constipation   . GERD (gastroesophageal reflux disease)   . Glaucoma   . Hypertension   . Hypothyroidism   . Insomnia   . Spinal stenosis   . Thyroid disease     Medications:  Scheduled:  . amLODipine  10 mg Oral Daily  . [START ON 07/31/2020] aspirin EC  81 mg Oral Daily  . famotidine  40 mg Oral QHS  . furosemide  40 mg Intravenous Q12H  . lisinopril  10 mg Oral Daily  . sodium chloride flush  3 mL Intravenous Q12H  . [START ON 07/31/2020] thyroid  30 mg Oral QAC breakfast   Infusions:  . sodium chloride    . heparin 600 Units/hr (07/30/20 1307)     Assessment: 86 yo F to start Heparin drip for ACS/STEMI.  Hgb 11.4  Plt 343  INR 1.1  APTT 31 No anticoagulants PTA per Med Rec   Goal of Therapy:  Heparin level 0.3-0.7 units/ml Monitor platelets by anticoagulation protocol: Yes   Plan:  Give 3000 units bolus x 1 Start heparin infusion at 600 units/hr Check anti-Xa level in 8 hours and daily while on heparin Continue to monitor H&H and platelets  Mani Celestin A 07/30/2020,2:55 PM

## 2020-07-30 NOTE — Progress Notes (Signed)
PHARMACY -  BRIEF ANTIBIOTIC NOTE   Pharmacy has received consult(s) for Cefepime from an ED provider.  The patient's profile has been reviewed for ht/wt/allergies/indication/available labs.    One time order(s) placed by MD for Cefepime 2 gm  Further antibiotics/pharmacy consults should be ordered by admitting physician if indicated.                       Thank you, Jaedyn Marrufo A 07/30/2020  11:06 AM

## 2020-07-31 ENCOUNTER — Other Ambulatory Visit: Payer: Self-pay

## 2020-07-31 ENCOUNTER — Encounter: Payer: Self-pay | Admitting: Internal Medicine

## 2020-07-31 ENCOUNTER — Inpatient Hospital Stay: Payer: Medicare Other

## 2020-07-31 DIAGNOSIS — N1832 Chronic kidney disease, stage 3b: Secondary | ICD-10-CM

## 2020-07-31 DIAGNOSIS — N179 Acute kidney failure, unspecified: Secondary | ICD-10-CM

## 2020-07-31 DIAGNOSIS — R778 Other specified abnormalities of plasma proteins: Secondary | ICD-10-CM

## 2020-07-31 LAB — BLOOD CULTURE ID PANEL (REFLEXED) - BCID2

## 2020-07-31 LAB — BASIC METABOLIC PANEL
Anion gap: 10 (ref 5–15)
BUN: 36 mg/dL — ABNORMAL HIGH (ref 8–23)
CO2: 24 mmol/L (ref 22–32)
Calcium: 9.1 mg/dL (ref 8.9–10.3)
Chloride: 101 mmol/L (ref 98–111)
Creatinine, Ser: 1.84 mg/dL — ABNORMAL HIGH (ref 0.44–1.00)
GFR calc Af Amer: 28 mL/min — ABNORMAL LOW (ref >60)
GFR calc non Af Amer: 24 mL/min — ABNORMAL LOW (ref >60)
Glucose, Bld: 86 mg/dL (ref 70–99)
Potassium: 3.6 mmol/L (ref 3.5–5.1)
Sodium: 135 mmol/L (ref 135–145)

## 2020-07-31 LAB — HEPARIN LEVEL (UNFRACTIONATED)
Heparin Unfractionated: 0.16 IU/mL — ABNORMAL LOW (ref 0.30–0.70)
Heparin Unfractionated: 0.68 IU/mL (ref 0.30–0.70)
Heparin Unfractionated: 0.59 IU/mL (ref 0.30–0.70)

## 2020-07-31 LAB — CBC
HCT: 28.5 % — ABNORMAL LOW (ref 36.0–46.0)
Hemoglobin: 9.7 g/dL — ABNORMAL LOW (ref 12.0–15.0)
MCH: 30.2 pg (ref 26.0–34.0)
MCHC: 34 g/dL (ref 30.0–36.0)
MCV: 88.8 fL (ref 80.0–100.0)
Platelets: 293 10*3/uL (ref 150–400)
RBC: 3.21 MIL/uL — ABNORMAL LOW (ref 3.87–5.11)
RDW: 13 % (ref 11.5–15.5)
WBC: 11.2 10*3/uL — ABNORMAL HIGH (ref 4.0–10.5)
nRBC: 0 % (ref 0.0–0.2)

## 2020-07-31 MED ORDER — FUROSEMIDE 40 MG PO TABS
40.0000 mg | ORAL_TABLET | Freq: Every day | ORAL | Status: DC
  Administered 2020-08-01: 40 mg via ORAL
  Filled 2020-07-31: qty 1

## 2020-07-31 MED ORDER — SODIUM CHLORIDE 0.9 % IV SOLN
1.0000 g | INTRAVENOUS | Status: DC
  Administered 2020-07-31: 1 g via INTRAVENOUS
  Filled 2020-07-31: qty 10
  Filled 2020-07-31: qty 1

## 2020-07-31 MED ORDER — VANCOMYCIN HCL 500 MG/100ML IV SOLN: 500.0000 mg | INTRAVENOUS | Status: DC

## 2020-07-31 MED ORDER — VANCOMYCIN HCL IN DEXTROSE 1-5 GM/200ML-% IV SOLN
1000.0000 mg | Freq: Once | INTRAVENOUS | Status: AC
  Administered 2020-07-31: 1000 mg via INTRAVENOUS
  Filled 2020-07-31 (×2): qty 200

## 2020-07-31 MED ORDER — FUROSEMIDE 10 MG/ML IJ SOLN
40.0000 mg | Freq: Once | INTRAMUSCULAR | Status: AC
  Administered 2020-07-31: 40 mg via INTRAVENOUS
  Filled 2020-07-31: qty 4

## 2020-07-31 MED ORDER — AMLODIPINE BESYLATE 5 MG PO TABS
5.0000 mg | ORAL_TABLET | Freq: Every day | ORAL | Status: DC
  Administered 2020-08-01: 5 mg via ORAL
  Filled 2020-07-31 (×2): qty 1

## 2020-07-31 MED ORDER — HEPARIN BOLUS VIA INFUSION
1500.0000 [IU] | Freq: Once | INTRAVENOUS | Status: AC
  Administered 2020-07-31: 1500 [IU] via INTRAVENOUS
  Filled 2020-07-31: qty 1500

## 2020-07-31 NOTE — Progress Notes (Addendum)
Progress Note    JONATHON CASTELO  JGO:[id number] DOB: 05/11/1929  DOA: 07/30/2020 PCP: Idelle Crouch, MD      Brief Narrative:    Medical records reviewed and are as summarized below:  LURLIE WIGEN is a 86 y.o. female  with medical history significant for hypertension, spinal stenosis, thyroid disease, glaucoma, GERD who was brought into the ER by EMS for evaluation of mental status changes and hypoxia.  Patient was said to have complained of chest pain one day prior to her admission and on the morning of her admission was found lethargic by family members who called EMS.  Patient was reported to have room air pulse oximetry in the 60s and was placed on CPAP.  Her blood pressure in the field was 167/84 and EMS placed 2 inch of Nitropaste on the patient prior to arrival to the ER.  He was admitted to the hospital for acute on chronic diastolic CHF and acute hypoxemic respiratory failure.  She was treated with IV Lasix.  She initially required BiPAP for respiratory failure and was transitioned to oxygen via nasal cannula.  She was seen in consultation by the cardiologist.  Elevated troponins was suspected to be due to demand ischemia.  However, cardiologist recommended treatment with IV heparin infusion.  Urine culture showed E. coli and is to clear whether this is true UTI or colonization.  She was treated with IV Rocephin.  Blood culture also showed staph epidermidis which is probably a contaminant.  She was treated with IV vancomycin pending repeat blood culture.    Assessment/Plan:   Principal Problem:   Acute CHF (congestive heart failure) (HCC) Active Problems:   Elevated troponin   Hypothyroidism   Hypertension   GERD (gastroesophageal reflux disease)   CKD stage 4 secondary to hypertension (HCC)   ACS (acute coronary syndrome) (HCC)   Acute respiratory failure (HCC)  Staph epidermidis bacteremia E. coli in urine, probable asymptomatic bacteriuria  Body mass index is  17.79 kg/m.     PLAN  She is off of BiPAP she is tolerating 4 L/min oxygen via nasal cannula.  Taper off oxygen as able.  Continue IV Lasix for acute on chronic diastolic CHF.  Monitor BMP and urine output closely.  Repeat chest x-ray.  2D echo showed EF estimated at 55 to 55%, grade 1 diastolic dysfunction, moderately elevated pulmonary artery systolic pressure, moderate to severe MR, moderate TR, mild aortic stenosis  Elevated troponins likely due to demand ischemia.  ACS unlikely.  Cardiologist recommended stopping IV heparin infusion after 48 hours.  Monitor heparin level per protocol.  Amlodipine has been decreased from 10 to 5 mg daily because of borderline hypotension.  Staph epidermidis bacteremia is probably due to contaminant.  Continue IV vancomycin for now.  Repeat blood culture tomorrow.   Diet Order            Diet Heart Room service appropriate? Yes; Fluid consistency: Thin  Diet effective now                    Consultants:  Cardiologist  Procedures:  None    Medications:   . [START ON 08/01/2020] amLODipine  5 mg Oral Daily  . aspirin EC  81 mg Oral Daily  . famotidine  20 mg Oral QHS  . furosemide  40 mg Intravenous Once  . [START ON 08/01/2020] furosemide  40 mg Oral Daily  . oxyCODONE  5 mg Oral QHS  .  sodium chloride flush  3 mL Intravenous Q12H  . thyroid  30 mg Oral QAC breakfast   Continuous Infusions: . sodium chloride    . heparin 800 Units/hr (07/31/20 0854)  . [START ON 08/02/2020] vancomycin       Anti-infectives (From admission, onward)   Start     Dose/Rate Route Frequency Ordered Stop   08/02/20 1000  vancomycin (VANCOREADY) IVPB 500 mg/100 mL        500 mg 100 mL/hr over 60 Minutes Intravenous Every 48 hours 07/31/20 0650     07/31/20 1000  ceFEPIme (MAXIPIME) 2 g in sodium chloride 0.9 % 100 mL IVPB  Status:  Discontinued        2 g 200 mL/hr over 30 Minutes Intravenous Every 24 hours 07/30/20 1104 07/30/20 1105    07/31/20 0730  vancomycin (VANCOCIN) IVPB 1000 mg/200 mL premix        1,000 mg 200 mL/hr over 60 Minutes Intravenous  Once 07/31/20 0648 07/31/20 1312   07/30/20 0915  ceFEPIme (MAXIPIME) 2 g in sodium chloride 0.9 % 100 mL IVPB        2 g 200 mL/hr over 30 Minutes Intravenous  Once 07/30/20 0903 07/30/20 0944   07/30/20 0915  azithromycin (ZITHROMAX) 500 mg in sodium chloride 0.9 % 250 mL IVPB        500 mg 250 mL/hr over 60 Minutes Intravenous  Once 07/30/20 0903 07/30/20 1029             Family Communication/Anticipated D/C date and plan/Code Status   DVT prophylaxis:      Code Status: Full Code  Family Communication: Plan discussed with patient Disposition Plan:    Status is: Inpatient  Remains inpatient appropriate because:IV treatments appropriate due to intensity of illness or inability to take PO and Inpatient level of care appropriate due to severity of illness   Dispo: The patient is from: Home              Anticipated d/c is to: Home              Anticipated d/c date is: 1 day              Patient currently is not medically stable to d/c.           Subjective:   No shortness of breath or chest pain.  She feels a little better today.  Objective:    Vitals:   07/31/20 0405 07/31/20 0500 07/31/20 0729 07/31/20 1208  BP: (!) 120/50  (!) 115/46 (!) 125/54  Pulse: 87  93 81  Resp: 18 17 16 16   Temp: 98.2 F (36.8 C)  98.1 F (36.7 C) 98.2 F (36.8 C)  TempSrc: Oral  Oral Oral  SpO2: 96%  94% 95%  Weight: 48.5 kg     Height: 5\' 5"  (1.651 m)      No data found.   Intake/Output Summary (Last 24 hours) at 07/31/2020 1429 Last data filed at 07/31/2020 0600 Gross per 24 hour  Intake 437.95 ml  Output 950 ml  Net -512.05 ml   Filed Weights   07/30/20 1001 07/30/20 2031 07/31/20 0405  Weight: 50.8 kg 50 kg 48.5 kg    Exam:  GEN: NAD SKIN: No rash EYES: EOMI ENT: MMM CV: RRR PULM: CTA B ABD: soft, ND, NT, +BS CNS: AAO x 3, non  focal EXT: No edema or tenderness   Data Reviewed:   I have personally reviewed following labs  and imaging studies:  Labs: Labs show the following:   Basic Metabolic Panel: Recent Labs  Lab 07/30/20 0835 07/30/20 1148 07/31/20 0732  NA 137  --  135  K 4.8  --  3.6  CL 105  --  101  CO2 20*  --  24  GLUCOSE 183*  --  86  BUN 31*  --  36*  CREATININE 1.60*  --  1.84*  CALCIUM 8.9  --  9.1  MG  --  2.0  --    GFR Estimated Creatinine Clearance: 15.9 mL/min (A) (by C-G formula based on SCr of 1.84 mg/dL (H)). Liver Function Tests: Recent Labs  Lab 07/30/20 0835  AST 105*  ALT 69*  ALKPHOS 65  BILITOT 1.0  PROT 7.1  ALBUMIN 3.7   No results for input(s): LIPASE, AMYLASE in the last 168 hours. No results for input(s): AMMONIA in the last 168 hours. Coagulation profile Recent Labs  Lab 07/30/20 0904  INR 1.1    CBC: Recent Labs  Lab 07/30/20 0835 07/31/20 0732  WBC 12.8* 11.2*  NEUTROABS 10.8*  --   HGB 11.4* 9.7*  HCT 35.9* 28.5*  MCV 92.8 88.8  PLT 343 293   Cardiac Enzymes: No results for input(s): CKTOTAL, CKMB, CKMBINDEX, TROPONINI in the last 168 hours. BNP (last 3 results) No results for input(s): PROBNP in the last 8760 hours. CBG: No results for input(s): GLUCAP in the last 168 hours. D-Dimer: No results for input(s): DDIMER in the last 72 hours. Hgb A1c: No results for input(s): HGBA1C in the last 72 hours. Lipid Profile: No results for input(s): CHOL, HDL, LDLCALC, TRIG, CHOLHDL, LDLDIRECT in the last 72 hours. Thyroid function studies: No results for input(s): TSH, T4TOTAL, T3FREE, THYROIDAB in the last 72 hours.  Invalid input(s): FREET3 Anemia work up: No results for input(s): VITAMINB12, FOLATE, FERRITIN, TIBC, IRON, RETICCTPCT in the last 72 hours. Sepsis Labs: Recent Labs  Lab 07/30/20 0835 07/30/20 1046 07/31/20 0732  PROCALCITON  --  0.17  --   WBC 12.8*  --  11.2*  LATICACIDVEN 3.3* 1.7  --     Microbiology Recent  Results (from the past 240 hour(s))  Blood Culture (routine x 2)     Status: None (Preliminary result)   Collection Time: 07/30/20  8:35 AM   Specimen: BLOOD  Result Value Ref Range Status   Specimen Description BLOOD LEFT ARM  Final   Special Requests   Final    BOTTLES DRAWN AEROBIC AND ANAEROBIC Blood Culture adequate volume   Culture   Final    NO GROWTH < 24 HOURS Performed at [facility], [address] [place], [place] [address]    Report Status PENDING  Incomplete  Blood Culture (routine x 2)     Status: None (Preliminary result)   Collection Time: 07/30/20  8:35 AM   Specimen: BLOOD  Result Value Ref Range Status   Specimen Description   Final    BLOOD RIGHT ARM Performed at [facility], [address], [place], [place] [address]    Special Requests   Final    BOTTLES DRAWN AEROBIC AND ANAEROBIC Blood Culture adequate volume Performed at [facility], [address] [place], [place] [address]    Culture  Setup Time   Final    GRAM POSITIVE COCCI AEROBIC BOTTLE ONLY CRITICAL RESULT CALLED TO, READ BACK BY AND VERIFIED WITH: Bandera AT [place] ON 07/31/20 SNG Performed at [facility], [address] [place], [place] [address]    Culture GRAM POSITIVE COCCI  Final   Report Status PENDING  Incomplete  Urine culture     Status: Abnormal (Preliminary result)   Collection Time: 07/30/20  8:35 AM   Specimen: Urine, Catheterized  Result Value Ref Range Status   Specimen Description   Final    URINE, CATHETERIZED Performed at [facility], [address], [place], [place] [address]    Special Requests   Final    NONE Performed at [facility], [address] [place], [place] [address]    Culture >=100,000 COLONIES/mL ESCHERICHIA COLI (A)  Final   Report Status PENDING  Incomplete  Blood Culture ID Panel (Reflexed)     Status: Abnormal   Collection Time: 07/30/20  8:35 AM  Result Value Ref Range Status    Enterococcus faecalis NOT DETECTED NOT DETECTED Final   Enterococcus Faecium NOT DETECTED NOT DETECTED Final   Listeria monocytogenes NOT DETECTED NOT DETECTED Final   Staphylococcus species DETECTED (A) NOT DETECTED Final    Comment: CRITICAL RESULT CALLED TO, READ BACK BY AND VERIFIED WITH: SCOTT HALL AT 0612 ON 07/31/20 SNG    Staphylococcus aureus (BCID) NOT DETECTED NOT DETECTED Final   Staphylococcus epidermidis DETECTED (A) NOT DETECTED Final    Comment: Methicillin (oxacillin) resistant coagulase negative staphylococcus. Possible blood culture contaminant (unless isolated from more than one blood culture draw or clinical case suggests pathogenicity). No antibiotic treatment is indicated for blood  culture contaminants. CRITICAL RESULT CALLED TO, READ BACK BY AND VERIFIED WITH: SCOTT HALL AT 0612 ON 07/31/20 SNG    Staphylococcus lugdunensis NOT DETECTED NOT DETECTED Final   Streptococcus species NOT DETECTED NOT DETECTED Final   Streptococcus agalactiae NOT DETECTED NOT DETECTED Final   Streptococcus pneumoniae NOT DETECTED NOT DETECTED Final   Streptococcus pyogenes NOT DETECTED NOT DETECTED Final   A.calcoaceticus-baumannii NOT DETECTED NOT DETECTED Final   Bacteroides fragilis NOT DETECTED NOT DETECTED Final   Enterobacterales NOT DETECTED NOT DETECTED Final   Enterobacter cloacae complex NOT DETECTED NOT DETECTED Final   Escherichia coli NOT DETECTED NOT DETECTED Final   Klebsiella aerogenes NOT DETECTED NOT DETECTED Final   Klebsiella oxytoca NOT DETECTED NOT DETECTED Final   Klebsiella pneumoniae NOT DETECTED NOT DETECTED Final   Proteus species NOT DETECTED NOT DETECTED Final   Salmonella species NOT DETECTED NOT DETECTED Final   Serratia marcescens NOT DETECTED NOT DETECTED Final   Haemophilus influenzae NOT DETECTED NOT DETECTED Final   Neisseria meningitidis NOT DETECTED NOT DETECTED Final   Pseudomonas aeruginosa NOT DETECTED NOT DETECTED Final   Stenotrophomonas  maltophilia NOT DETECTED NOT DETECTED Final   Candida albicans NOT DETECTED NOT DETECTED Final   Candida auris NOT DETECTED NOT DETECTED Final   Candida glabrata NOT DETECTED NOT DETECTED Final   Candida krusei NOT DETECTED NOT DETECTED Final   Candida parapsilosis NOT DETECTED NOT DETECTED Final   Candida tropicalis NOT DETECTED NOT DETECTED Final   Cryptococcus neoformans/gattii NOT DETECTED NOT DETECTED Final   Methicillin resistance mecA/C DETECTED (A) NOT DETECTED Final    Comment: CRITICAL RESULT CALLED TO, READ BACK BY AND VERIFIED WITH: SCOTT HALL AT 0612 ON 07/31/20 SNG Performed at [facility], [address] [place], [place] [address]   Respiratory Panel by RT PCR (Flu A&B, Covid) - Nasopharyngeal Swab     Status: None   Collection Time: 07/30/20  8:36 AM   Specimen: Nasopharyngeal Swab  Result Value Ref  Range Status   SARS Coronavirus 2 by RT PCR NEGATIVE NEGATIVE Final    Comment: (NOTE) SARS-CoV-2 target nucleic acids are NOT DETECTED.  The SARS-CoV-2 RNA is generally detectable in upper respiratoy specimens during the acute phase of infection. The lowest concentration of SARS-CoV-2 viral copies this assay can detect is 131 copies/mL. A negative result does not preclude SARS-Cov-2 infection and should not be used as the sole basis for treatment or other patient management decisions. A negative result may occur with  improper specimen collection/handling, submission of specimen other than nasopharyngeal swab, presence of viral mutation(s) within the areas targeted by this assay, and inadequate number of viral copies (<131 copies/mL). A negative result must be combined with clinical observations, patient history, and epidemiological information. The expected result is Negative.  Fact Sheet for Patients:  PinkCheek.be  Fact Sheet for Healthcare Providers:  GravelBags.it  This test is no t yet  approved or cleared by the [place] FDA and  has been authorized for detection and/or diagnosis of SARS-CoV-2 by FDA under an Emergency Use Authorization (EUA). This EUA will remain  in effect (meaning this test can be used) for the duration of the COVID-19 declaration under Section 564(b)(1) of the Act, 21 U.S.C. section 360bbb-3(b)(1), unless the authorization is terminated or revoked sooner.     Influenza A by PCR NEGATIVE NEGATIVE Final   Influenza B by PCR NEGATIVE NEGATIVE Final    Comment: (NOTE) The Xpert Xpress SARS-CoV-2/FLU/RSV assay is intended as an aid in  the diagnosis of influenza from Nasopharyngeal swab specimens and  should not be used as a sole basis for treatment. Nasal washings and  aspirates are unacceptable for Xpert Xpress SARS-CoV-2/FLU/RSV  testing.  Fact Sheet for Patients: PinkCheek.be  Fact Sheet for Healthcare Providers: GravelBags.it  This test is not yet approved or cleared by the [place] FDA and  has been authorized for detection and/or diagnosis of SARS-CoV-2 by  FDA under an Emergency Use Authorization (EUA). This EUA will remain  in effect (meaning this test can be used) for the duration of the  Covid-19 declaration under Section 564(b)(1) of the Act, 21  U.S.C. section 360bbb-3(b)(1), unless the authorization is  terminated or revoked. Performed at [facility], [address] [place], [place] [address]     Procedures and diagnostic studies:  DG Chest Portable 1 View  Result Date: 07/30/2020 CLINICAL DATA:  Shortness of breath. EXAM: PORTABLE CHEST 1 VIEW COMPARISON:  October 02, 2016. FINDINGS: Stable cardiomediastinal silhouette. No pneumothorax or pleural effusion is noted. Right-sided pacemaker is unchanged in position. Increased interstitial opacities are noted throughout both lungs concerning for pneumonia or edema. Bony thorax is unremarkable. IMPRESSION:  Increased bilateral interstitial opacities are noted concerning for pneumonia or edema. Aortic Atherosclerosis (ICD10-I70.0). Electronically Signed   By: Marijo Conception M.D.   On: 07/30/2020 08:49   ECHOCARDIOGRAM COMPLETE  Result Date: 07/30/2020    ECHOCARDIOGRAM REPORT   Patient Name:   VERENA SHAWGO Date of Exam: 07/30/2020 Medical Rec #:  [id number]     Height:       64.0 in Accession #:    [id number]    Weight:       112.0 lb Date of Birth:  1928-11-14      BSA:          1.529 m Patient Age:    69 years      BP:           119/60 mmHg  Patient Gender: F             HR:           56 bpm. Exam Location:  [facility] Procedure: 2D Echo, Cardiac Doppler and Color Doppler Indications:     CHF- acute diastolic 025.42  History:         Patient has no prior history of Echocardiogram examinations.                  Risk Factors:Hypertension. Anemia.  Sonographer:     Sherrie Sport RDCS (AE) Referring Phys:  [place] Diagnosing Phys: Ida Rogue MD IMPRESSIONS  1. Left ventricular ejection fraction, by estimation, is 55 to 60%. The left ventricle has normal function. The left ventricle has no regional wall motion abnormalities. Left ventricular diastolic parameters are consistent with Grade I diastolic dysfunction (impaired relaxation).  2. Right ventricular systolic function is normal. The right ventricular size is normal. There is moderately elevated pulmonary artery systolic pressure. The estimated right ventricular systolic pressure is 70.6 mmHg.  3. Moderate to severe mitral valve regurgitation.  4. Tricuspid valve regurgitation is moderate.  5. Mild aortic valve stenosis.  6. Left atrial size was mildly dilated.  7. Left pleural effusion. FINDINGS  Left Ventricle: Left ventricular ejection fraction, by estimation, is 55 to 60%. The left ventricle has normal function. The left ventricle has no regional wall motion abnormalities. The left ventricular internal cavity size was normal in size. There is  no left  ventricular hypertrophy. Left ventricular diastolic parameters are consistent with Grade I diastolic dysfunction (impaired relaxation). Right Ventricle: The right ventricular size is normal. No increase in right ventricular wall thickness. Right ventricular systolic function is normal. There is moderately elevated pulmonary artery systolic pressure. The tricuspid regurgitant velocity is 3.41 m/s, and with an assumed right atrial pressure of 5 mmHg, the estimated right ventricular systolic pressure is 23.7 mmHg. Left Atrium: Left atrial size was mildly dilated. Right Atrium: Right atrial size was normal in size. Pericardium: There is no evidence of pericardial effusion. Mitral Valve: The mitral valve is normal in structure. Moderate to severe mitral valve regurgitation. No evidence of mitral valve stenosis. Tricuspid Valve: The tricuspid valve is normal in structure. Tricuspid valve regurgitation is moderate . No evidence of tricuspid stenosis. Aortic Valve: The aortic valve is normal in structure. Aortic valve regurgitation is not visualized. Mild aortic stenosis is present. Aortic valve mean gradient measures 9.3 mmHg. Aortic valve peak gradient measures 17.0 mmHg. Aortic valve area, by VTI measures 1.21 cm. Pulmonic Valve: The pulmonic valve was normal in structure. Pulmonic valve regurgitation is not visualized. No evidence of pulmonic stenosis. Aorta: The aortic root is normal in size and structure. Venous: The inferior vena cava is normal in size with greater than 50% respiratory variability, suggesting right atrial pressure of 3 mmHg. IAS/Shunts: No atrial level shunt detected by color flow Doppler. Additional Comments: A pacer wire is visualized.  LEFT VENTRICLE PLAX 2D LVIDd:         3.88 cm  Diastology LVIDs:         2.49 cm  LV e' medial:  4.90 cm/s LV PW:         1.08 cm  LV e' lateral: 5.11 cm/s LV IVS:        1.06 cm LVOT diam:     2.00 cm LV SV:         46 LV SV Index:   30 LVOT Area:  3.14 cm   RIGHT VENTRICLE RV Basal diam:  3.32 cm RV S prime:     15.10 cm/s TAPSE (M-mode): 2.7 cm LEFT ATRIUM             Index       RIGHT ATRIUM           Index LA diam:        3.90 cm 2.55 cm/m  RA Area:     16.70 cm LA Vol (A2C):   64.4 ml 42.12 ml/m RA Volume:   42.70 ml  27.93 ml/m LA Vol (A4C):   59.9 ml 39.17 ml/m LA Biplane Vol: 63.4 ml 41.46 ml/m  AORTIC VALVE                    PULMONIC VALVE AV Area (Vmax):    1.10 cm     PV Vmax:        0.68 m/s AV Area (Vmean):   1.15 cm     PV Peak grad:   1.8 mmHg AV Area (VTI):     1.21 cm     RVOT Peak grad: 2 mmHg AV Vmax:           206.33 cm/s AV Vmean:          137.667 cm/s AV VTI:            0.384 m AV Peak Grad:      17.0 mmHg AV Mean Grad:      9.3 mmHg LVOT Vmax:         72.40 cm/s LVOT Vmean:        50.300 cm/s LVOT VTI:          0.148 m LVOT/AV VTI ratio: 0.39  AORTA Ao Root diam: 2.40 cm TRICUSPID VALVE TR Peak grad:   46.5 mmHg TR Vmax:        341.00 cm/s  SHUNTS Systemic VTI:  0.15 m Systemic Diam: 2.00 cm Ida Rogue MD Electronically signed by Ida Rogue MD Signature Date/Time: 07/30/2020/3:08:09 PM    Final                LOS: 1 day   Eunice Winecoff  [facility]   Pager on [email or web address]. If 7PM-7AM, please contact night-coverage at [email or web address]     07/31/2020, 2:29 PM

## 2020-07-31 NOTE — Progress Notes (Signed)
Progress Note  Patient Name: Courtney Manning Date of Encounter: 07/31/2020  Primary Cardiologist: [facility]  Subjective   SOB improved. No chest pain. Documented UOP of 512 mL for the past 24 hours/admission. Weight 50-->48.5 kg. Slight bump in BUN/SCr this morning. Echo with preserved LVSF, Gr1DD, moderate MR/TR and moderately elevated PASP ~ 51 mmHg, and a left pleural effusion.    Inpatient Medications    Scheduled Meds: . amLODipine  10 mg Oral Daily  . aspirin EC  81 mg Oral Daily  . famotidine  20 mg Oral QHS  . furosemide  40 mg Intravenous Q12H  . oxyCODONE  5 mg Oral QHS  . sodium chloride flush  3 mL Intravenous Q12H  . thyroid  30 mg Oral QAC breakfast   Continuous Infusions: . sodium chloride    . heparin 800 Units/hr (07/31/20 0854)  . vancomycin    . [START ON 08/02/2020] vancomycin     PRN Meds: sodium chloride, acetaminophen, menthol-cetylpyridinium, ondansetron (ZOFRAN) IV, sodium chloride flush   Vital Signs    Vitals:   07/31/20 0400 07/31/20 0405 07/31/20 0500 07/31/20 0729  BP:  (!) 120/50  (!) 115/46  Pulse:  87  93  Resp: 17 18 17 16   Temp:  98.2 F (36.8 C)  98.1 F (36.7 C)  TempSrc:  Oral  Oral  SpO2:  96%  94%  Weight:  48.5 kg    Height:  5\' 5"  (1.651 m)      Intake/Output Summary (Last 24 hours) at 07/31/2020 0901 Last data filed at 07/31/2020 0600 Gross per 24 hour  Intake 437.95 ml  Output 950 ml  Net -512.05 ml   Filed Weights   07/30/20 1001 07/30/20 2031 07/31/20 0405  Weight: 50.8 kg 50 kg 48.5 kg    Telemetry    Paced - Personally Reviewed  ECG    No new tracings - Personally Reviewed  Physical Exam   GEN: Elderly and frail appearing. No acute distress.   Neck: No JVD. Cardiac: RRR, II/VI systolic murmur USB, no rubs, or gallops.  Respiratory: Diminished breath sounds along the left base.  GI: Soft, nontender, non-distended.   MS: No edema; No deformity. Neuro:  Alert and oriented x 3; Nonfocal.  Psych: Normal  affect.  Labs    Chemistry Recent Labs  Lab 07/30/20 0835 07/31/20 0732  NA 137 135  K 4.8 3.6  CL 105 101  CO2 20* 24  GLUCOSE 183* 86  BUN 31* 36*  CREATININE 1.60* 1.Courtney*  CALCIUM 8.9 9.1  PROT 7.1  --   ALBUMIN 3.7  --   AST 105*  --   ALT 69*  --   ALKPHOS 65  --   BILITOT 1.0  --   GFRNONAA 28* 24*  GFRAA 33* 28*  ANIONGAP 12 10     Hematology Recent Labs  Lab 07/30/20 0835 07/31/20 0732  WBC 12.8* 11.2*  RBC 3.87 3.21*  HGB 11.4* 9.7*  HCT 35.9* 28.5*  MCV 92.8 88.8  MCH 29.5 30.2  MCHC 31.8 34.0  RDW 13.0 13.0  PLT 343 293    Cardiac EnzymesNo results for input(s): TROPONINI in the last 168 hours. No results for input(s): TROPIPOC in the last 168 hours.   BNP Recent Labs  Lab 07/30/20 0835  BNP 464.3*     DDimer No results for input(s): DDIMER in the last 168 hours.   Radiology    DG Chest Portable 1 View  Result Date:  07/30/2020 IMPRESSION: Increased bilateral interstitial opacities are noted concerning for pneumonia or edema. Aortic Atherosclerosis (ICD10-I70.0). Electronically Signed   By: Marijo Conception M.D.   On: 07/30/2020 08:49    Cardiac Studies   2D echo 07/30/2020: 1. Left ventricular ejection fraction, by estimation, is 55 to 60%. The  left ventricle has normal function. The left ventricle has no regional  wall motion abnormalities. Left ventricular diastolic parameters are  consistent with Grade I diastolic  dysfunction (impaired relaxation).  2. Right ventricular systolic function is normal. The right ventricular  size is normal. There is moderately elevated pulmonary artery systolic  pressure. The estimated right ventricular systolic pressure is 80.1 mmHg.  3. Moderate to severe mitral valve regurgitation.  4. Tricuspid valve regurgitation is moderate.  5. Mild aortic valve stenosis.  6. Left atrial size was mildly dilated.  7. Left pleural effusion.  Patient Profile     Courtney Manning with history of syncope,  complete heart block status post [employer] dual-chamber PPM on 02/10/2015 [facility], CKD stage III, lumbar stenosis with radiculopathy, HTN, HLD, and hypothyroidism who is being seen today for the evaluation of dyspnea at the request of Dr. Francine Graven.  Assessment & Plan    1. Acute hypoxic respiratory distress/acute HFpEF/pulmonary hypertension: -Cannot exclude some degree of flash pulmonary edema leading to presentation -Improving -Weaned off BiPAP, remains on supplemental oxygen via nasal cannula, wean as tolerated -Continue IV Lasix with close monitoring of renal function -Echo as above  2.  Elevated troponin: -Initial high-sensitivity troponin 41 with a delta of 101 and peak of 131, now down trending -Heparin drip is reasonable for now while troponin is trended -Echo with preserved LVSF -ASA -Defer initiating atorvastatin with transaminitis noted -With CKD, not a great candidate for invasive ischemic evaluation at this time  3.  Complete heart block: -Status post [employer] PPM -Device appears to be functioning normally -Followed by [facility]  4.  Acute on CKD stage III: -Slight bump in renal function  -Monitor with diuresis  6.  HTN:  -Blood pressure elevated upon arrival into the 655V systolic, now much improved -Continue current therapy  7. Anemia: -Management per IM  8. Transaminitis: -Likely secondary to congestive hepatopathy  -Diuresis as above  For questions or updates, please contact [facility] Please consult [email or web address] for contact info under Cardiology/STEMI.    Signed, Christell Faith, PA-C [place] Pager: [phone] 07/31/2020, 9:01 AM

## 2020-07-31 NOTE — Progress Notes (Signed)
Pharmacy Antibiotic Note  Courtney Manning is a 86 y.o. female admitted on 07/30/2020 with bacteremia.  Pharmacy has been consulted for Vancomycin dosing.  Plan: Vancomycin 1gm x 1 then 500mg  IV q48hrs (per nomogram)  Height: 5\' 5"  (165.1 cm) Weight: 48.5 kg (106 lb 14.8 oz) IBW/kg (Calculated) : 57  Temp (24hrs), Avg:97.3 F (36.3 C), Min:94.9 F (34.9 C), Max:98.2 F (36.8 C)  Recent Labs  Lab 07/30/20 0835 07/30/20 1046  WBC 12.8*  --   CREATININE 1.60*  --   LATICACIDVEN 3.3* 1.7    Estimated Creatinine Clearance: 18.3 mL/min (A) (by C-G formula based on SCr of 1.6 mg/dL (H)).    Allergies  Allergen Reactions  . Codeine Anaphylaxis    Not Specified  . Brimonidine Tartrate Itching  . Fluoxetine Other (See Comments)    Reaction: Unknown  . Iodine   . Lodine [Etodolac]   . Morphine Itching  . Omeprazole Nausea And Vomiting  . Other Other (See Comments)  . Propoxyphene Other (See Comments)  . Brimonidine Itching and Other (See Comments)  . Ciprofloxacin Rash  . Gabapentin Rash    Antimicrobials this admission:   >>    >>   Dose adjustments this admission:   Microbiology results:  BCx:   UCx:    Sputum:    MRSA PCR:   Thank you for allowing pharmacy to be a part of this patient's care.  [name] A 07/31/2020 6:51 AM

## 2020-07-31 NOTE — Progress Notes (Signed)
ANTICOAGULATION CONSULT NOTE  Pharmacy Consult for Heparin drip Indication: chest pain/ACS  Patient Measurements: Height: 5\' 5"  (165.1 cm) Weight: 48.5 kg (106 lb 14.8 oz) IBW/kg (Calculated) : 57 Heparin Dosing Weight: 50.8 kg  Labs: Recent Labs    07/30/20 0835 07/30/20 0904 07/30/20 1046 07/30/20 1148 07/30/20 1453 07/30/20 2147 07/31/20 0732 07/31/20 1500  HGB 11.4*  --   --   --   --   --  9.7*  --   HCT 35.9*  --   --   --   --   --  28.5*  --   PLT 343  --   --   --   --   --  293  --   APTT  --  31  --   --   --   --   --   --   LABPROT  --  13.8  --   --   --   --   --   --   INR  --  1.1  --   --   --   --   --   --   HEPARINUNFRC  --   --   --   --   --  0.31 0.16* 0.68  CREATININE 1.60*  --   --   --   --   --  1.84*  --   TROPONINIHS 41*  --    < > 117* 131* 95*  --   --    < > = values in this interval not displayed.    Estimated Creatinine Clearance: 15.9 mL/min (A) (by C-G formula based on SCr of 1.84 mg/dL (H)).   Medical History: Past Medical History:  Diagnosis Date  . Anemia   . Cervical cancer ([employer])    Uterine Cancer  . Constipation   . GERD (gastroesophageal reflux disease)   . Glaucoma   . Hypertension   . Hypothyroidism   . Insomnia   . Spinal stenosis   . Thyroid disease    Infusions:  . sodium chloride    . heparin 800 Units/hr (07/31/20 0854)  . [START ON 08/02/2020] vancomycin      Assessment: 86 yo F to start Heparin drip for ACS/STEMI.  Hgb 11.4  Plt 343  INR 1.1  APTT 31 No anticoagulants PTA per Med Rec  10/1 at 2147 HL = 0.31, lower end of therapeutic range. Continue heparin drip at 600 units/hr  10/2 at 0732 HL = 0.16 subtherapeutic.Will order bolus of 1500 units and increase heparin drip to 800 units/hr. Hgb 9.7 plt 293  Goal of Therapy:  Heparin level 0.3-0.7 units/ml Monitor platelets by anticoagulation protocol: Yes   Plan:  --10/2 at 1500 HL = 0.68 therapeutic.Will continue heparin drip at 750 units/hr. Hgb  9.7 plt 293 --check confirmatory level in 8 hrs --Daily CBC per protocol  Candela Krul A 07/31/2020,3:41 PM

## 2020-07-31 NOTE — Plan of Care (Signed)
Discussed with patient plan of care for the evening, pain management and heparin drip with some teach back displayed

## 2020-07-31 NOTE — Progress Notes (Signed)
Mobility Specialist - Progress Note   07/31/20 1300  Mobility  Activity Ambulated in room  Range of Motion/Exercises Right leg;Left leg (ankle pumps, hip iso, hip abd, standing march, heel slide)  Level of Assistance Standby assist, set-up cues, supervision of patient - no hands on  Assistive Device Front wheel walker  Distance Ambulated (ft) 16 ft  Mobility Response Tolerated well  Mobility performed by Mobility specialist  $Mobility charge 1 Mobility    Pre-mobility: 78 HR, 98% SpO2 During mobility: 84 HR, 99% SpO2 Post-mobility: 81 HR, 98% SpO2  Pt was lying in bed upon utilizing 4L [place] O2. Pt's family member was present in room. Pt agreed to session. Pt denied any pain, dizziness, nausea, or fatigue. Per discussion with family, pt has been experiencing LE cramps that comes and goes prior to admission. Also, per discussion with family, pt ambulates independently using SPC at home. Pt was SBA getting EOB where she performed exercises: heel slides, straight leg raises, ankle pumps, hip isometrics, standing abduction (10x/leg), standing march (10 sec.). Noted LOB 1x while performing standing march. Pt ambulated 16' around the room using RW. Pt denied SOB and no LOB was noted during ambulation. Oxygen levels maintained high 90s throughout session. Pt inquiring about weaning off of oxygen as she stated she "did not use one at home." Overall, pt tolerated session well. Pt was left in bed with all needs in reach. Nurse entered at the end of session and was notified of performance.    Kathee Delton Mobility Specialist 07/31/20, 1:32 PM

## 2020-07-31 NOTE — Progress Notes (Signed)
PHARMACY - PHYSICIAN COMMUNICATION CRITICAL VALUE ALERT - BLOOD CULTURE IDENTIFICATION (BCID)  Courtney Manning is an 86 y.o. female who presented to [facility] on 07/30/2020 with a chief complaint of chest pain, ams.  Assessment:  Lab reports 1 of 4 bottles w/ staph epidermidis  Name of physician (or Provider) ContactedRachael Fee, NP  Current antibiotics: none  Changes to prescribed antibiotics recommended: add Vancomycin Recommendations accepted by provider  Results for orders placed or performed during the hospital encounter of 07/30/20  Blood Culture ID Panel (Reflexed) (Collected: 07/30/2020  8:35 AM)  Result Value Ref Range   Enterococcus faecalis NOT DETECTED NOT DETECTED   Enterococcus Faecium NOT DETECTED NOT DETECTED   Listeria monocytogenes NOT DETECTED NOT DETECTED   Staphylococcus species DETECTED (A) NOT DETECTED   Staphylococcus aureus (BCID) NOT DETECTED NOT DETECTED   Staphylococcus epidermidis DETECTED (A) NOT DETECTED   Staphylococcus lugdunensis NOT DETECTED NOT DETECTED   Streptococcus species NOT DETECTED NOT DETECTED   Streptococcus agalactiae NOT DETECTED NOT DETECTED   Streptococcus pneumoniae NOT DETECTED NOT DETECTED   Streptococcus pyogenes NOT DETECTED NOT DETECTED   A.calcoaceticus-baumannii NOT DETECTED NOT DETECTED   Bacteroides fragilis NOT DETECTED NOT DETECTED   Enterobacterales NOT DETECTED NOT DETECTED   Enterobacter cloacae complex NOT DETECTED NOT DETECTED   Escherichia coli NOT DETECTED NOT DETECTED   Klebsiella aerogenes NOT DETECTED NOT DETECTED   Klebsiella oxytoca NOT DETECTED NOT DETECTED   Klebsiella pneumoniae NOT DETECTED NOT DETECTED   Proteus species NOT DETECTED NOT DETECTED   Salmonella species NOT DETECTED NOT DETECTED   Serratia marcescens NOT DETECTED NOT DETECTED   Haemophilus influenzae NOT DETECTED NOT DETECTED   Neisseria meningitidis NOT DETECTED NOT DETECTED   Pseudomonas aeruginosa NOT DETECTED NOT DETECTED    Stenotrophomonas maltophilia NOT DETECTED NOT DETECTED   Candida albicans NOT DETECTED NOT DETECTED   Candida auris NOT DETECTED NOT DETECTED   Candida glabrata NOT DETECTED NOT DETECTED   Candida krusei NOT DETECTED NOT DETECTED   Candida parapsilosis NOT DETECTED NOT DETECTED   Candida tropicalis NOT DETECTED NOT DETECTED   Cryptococcus neoformans/gattii NOT DETECTED NOT DETECTED   Methicillin resistance mecA/C DETECTED (A) NOT DETECTED    [name] A 07/31/2020  6:22 AM

## 2020-07-31 NOTE — Progress Notes (Signed)
ANTICOAGULATION CONSULT NOTE  Pharmacy Consult for Heparin drip Indication: chest pain/ACS  Patient Measurements: Height: 5\' 5"  (165.1 cm) Weight: 48.5 kg (106 lb 14.8 oz) IBW/kg (Calculated) : 57 Heparin Dosing Weight: 50.8 kg  Labs: Recent Labs    07/30/20 0835 07/30/20 0904 07/30/20 1046 07/30/20 1148 07/30/20 1453 07/30/20 2147 07/30/20 2147 07/31/20 0732 07/31/20 1500 07/31/20 2149  HGB 11.4*  --   --   --   --   --   --  9.7*  --   --   HCT 35.9*  --   --   --   --   --   --  28.5*  --   --   PLT 343  --   --   --   --   --   --  293  --   --   APTT  --  31  --   --   --   --   --   --   --   --   LABPROT  --  13.8  --   --   --   --   --   --   --   --   INR  --  1.1  --   --   --   --   --   --   --   --   HEPARINUNFRC  --   --   --   --   --  0.31   < > 0.16* 0.68 0.59  CREATININE 1.60*  --   --   --   --   --   --  1.84*  --   --   TROPONINIHS 41*  --    < > 117* 131* 95*  --   --   --   --    < > = values in this interval not displayed.    Estimated Creatinine Clearance: 15.9 mL/min (A) (by C-G formula based on SCr of 1.84 mg/dL (H)).   Medical History: Past Medical History:  Diagnosis Date  . Anemia   . Cervical cancer ([place])    Uterine Cancer  . Constipation   . GERD (gastroesophageal reflux disease)   . Glaucoma   . Hypertension   . Hypothyroidism   . Insomnia   . Spinal stenosis   . Thyroid disease    Infusions:  . sodium chloride 250 mL (07/31/20 2233)  . cefTRIAXone (ROCEPHIN)  IV 1 g (07/31/20 2236)  . heparin 750 Units/hr (07/31/20 1748)  . [START ON 08/02/2020] vancomycin      Assessment: 86 yo F to start Heparin drip for ACS/STEMI.  Hgb 11.4  Plt 343  INR 1.1  APTT 31 No anticoagulants PTA per Med Rec  10/1 at 2147 HL = 0.31, lower end of therapeutic range. Continue heparin drip at 600 units/hr  10/2 at 0732 HL = 0.16 subtherapeutic.Will order bolus of 1500 units and increase heparin drip to 800 units/hr. Hgb 9.7 plt 293  Goal of  Therapy:  Heparin level 0.3-0.7 units/ml Monitor platelets by anticoagulation protocol: Yes   Plan:  --10/2 at 2149 HL = 0.59 therapeutic x 2. Will continue heparin drip at 750 units/hr.  --check HL in am w/ CBC  --Daily CBC per protocol  [name] A 07/31/2020,10:47 PM

## 2020-07-31 NOTE — Progress Notes (Signed)
ANTICOAGULATION CONSULT NOTE  Pharmacy Consult for Heparin drip Indication: chest pain/ACS  Patient Measurements: Height: 5\' 5"  (165.1 cm) Weight: 48.5 kg (106 lb 14.8 oz) IBW/kg (Calculated) : 57 Heparin Dosing Weight: 50.8 kg  Labs: Recent Labs    07/30/20 0835 07/30/20 0904 07/30/20 1046 07/30/20 1148 07/30/20 1453 07/30/20 2147 07/31/20 0732  HGB 11.4*  --   --   --   --   --  9.7*  HCT 35.9*  --   --   --   --   --  28.5*  PLT 343  --   --   --   --   --  293  APTT  --  31  --   --   --   --   --   LABPROT  --  13.8  --   --   --   --   --   INR  --  1.1  --   --   --   --   --   HEPARINUNFRC  --   --   --   --   --  0.31 0.16*  CREATININE 1.60*  --   --   --   --   --  1.84*  TROPONINIHS 41*  --    < > 117* 131* 95*  --    < > = values in this interval not displayed.    Estimated Creatinine Clearance: 15.9 mL/min (A) (by C-G formula based on SCr of 1.84 mg/dL (H)).   Medical History: Past Medical History:  Diagnosis Date   Anemia    Cervical cancer ([facility])    Uterine Cancer   Constipation    GERD (gastroesophageal reflux disease)    Glaucoma    Hypertension    Hypothyroidism    Insomnia    Spinal stenosis    Thyroid disease    Infusions:   sodium chloride     heparin 600 Units/hr (07/31/20 0558)   vancomycin     [START ON 08/02/2020] vancomycin      Assessment: 86 yo F to start Heparin drip for ACS/STEMI.  Hgb 11.4  Plt 343  INR 1.1  APTT 31 No anticoagulants PTA per Med Rec  10/1 at 2147 HL = 0.31, lower end of therapeutic range. Continue heparin drip at 600 units/hr  Goal of Therapy:  Heparin level 0.3-0.7 units/ml Monitor platelets by anticoagulation protocol: Yes   Plan:  --10/2 at 0732 HL = 0.16 subtherapeutic.Will order bolus of 1500 units and increase heparin drip to 800 units/hr. Hgb 9.7 plt 293 --Recheck confirmatory level at 1500 --Daily CBC per protocol  Deiona Hooper A 07/31/2020,8:31 AM

## 2020-07-31 NOTE — Progress Notes (Signed)
Patient fell asleep and was unable to contact her husband before bedtime.  Will have first shift make sure she calls today.

## 2020-08-01 DIAGNOSIS — I959 Hypotension, unspecified: Secondary | ICD-10-CM

## 2020-08-01 LAB — CBC
HCT: 29.2 % — ABNORMAL LOW (ref 36.0–46.0)
Hemoglobin: 10.2 g/dL — ABNORMAL LOW (ref 12.0–15.0)
MCH: 29.9 pg (ref 26.0–34.0)
MCHC: 34.9 g/dL (ref 30.0–36.0)
MCV: 85.6 fL (ref 80.0–100.0)
Platelets: 338 10*3/uL (ref 150–400)
RBC: 3.41 MIL/uL — ABNORMAL LOW (ref 3.87–5.11)
RDW: 12.8 % (ref 11.5–15.5)
WBC: 10.2 10*3/uL (ref 4.0–10.5)
nRBC: 0 % (ref 0.0–0.2)

## 2020-08-01 LAB — URINE CULTURE: Culture: >=100000 — AB

## 2020-08-01 LAB — BASIC METABOLIC PANEL
Anion gap: 14 (ref 5–15)
BUN: 36 mg/dL — ABNORMAL HIGH (ref 8–23)
CO2: 24 mmol/L (ref 22–32)
Calcium: 9 mg/dL (ref 8.9–10.3)
Chloride: 99 mmol/L (ref 98–111)
Creatinine, Ser: 1.9 mg/dL — ABNORMAL HIGH (ref 0.44–1.00)
GFR calc Af Amer: 27 mL/min — ABNORMAL LOW (ref >60)
GFR calc non Af Amer: 23 mL/min — ABNORMAL LOW (ref >60)
Glucose, Bld: 89 mg/dL (ref 70–99)
Potassium: 3.5 mmol/L (ref 3.5–5.1)
Sodium: 137 mmol/L (ref 135–145)

## 2020-08-01 LAB — HEPARIN LEVEL (UNFRACTIONATED): Heparin Unfractionated: 0.4 IU/mL (ref 0.30–0.70)

## 2020-08-01 MED ORDER — CEPHALEXIN 500 MG PO CAPS
500.0000 mg | ORAL_CAPSULE | Freq: Two times a day (BID) | ORAL | Status: DC
  Administered 2020-08-01 – 2020-08-02 (×2): 500 mg via ORAL
  Filled 2020-08-01 (×2): qty 1

## 2020-08-01 NOTE — Plan of Care (Signed)
PT alert but forgetful. Pt denies pain or any acute complaints. VSS. Pt on 2LNC. Heparin gtt infusing at 7.5 ml/hr. ABX given per MAR. No acute changes. Will continue to monitor.  Problem: Education: Goal: Knowledge of General Education information will improve Description: Including pain rating scale, medication(s)/side effects and non-pharmacologic comfort measures Outcome: Progressing   Problem: Activity: Goal: Risk for activity intolerance will decrease Outcome: Progressing   Problem: Pain Managment: Goal: General experience of comfort will improve Outcome: Progressing   Problem: Safety: Goal: Ability to remain free from injury will improve Outcome: Progressing   Problem: Skin Integrity: Goal: Risk for impaired skin integrity will decrease Outcome: Progressing   Problem: Cardiac: Goal: Ability to achieve and maintain adequate cardiopulmonary perfusion will improve Outcome: Progressing

## 2020-08-01 NOTE — Progress Notes (Signed)
Progress Note  Patient Name: Courtney Manning Date of Encounter: 08/01/2020  [facility] Cardiologist: Festus Aloe, seen by Dr. Rockey Situ this admission  Subjective   Daughter at bedside today. Reviewed workup, management thus far. Asking if we know what triggered event, how they can monitor and treat at home, see below. Hoping to go home soon. Was not on home O2 prior to admission. No chest pain, breathing stable.  Inpatient Medications    Scheduled Meds: . amLODipine  5 mg Oral Daily  . aspirin EC  81 mg Oral Daily  . famotidine  20 mg Oral QHS  . oxyCODONE  5 mg Oral QHS  . sodium chloride flush  3 mL Intravenous Q12H  . thyroid  30 mg Oral QAC breakfast   Continuous Infusions: . sodium chloride Stopped (08/01/20 0008)  . cefTRIAXone (ROCEPHIN)  IV Stopped (07/31/20 2308)  . heparin 750 Units/hr (08/01/20 0121)   PRN Meds: sodium chloride, acetaminophen, menthol-cetylpyridinium, ondansetron (ZOFRAN) IV, sodium chloride flush   Vital Signs    Vitals:   07/31/20 1939 08/01/20 0454 08/01/20 0753 08/01/20 1154  BP: (!) 108/44 138/61 (!) 117/50 (!) 124/48  Pulse: 92 85 81 80  Resp: 18 16 16 16   Temp: 98.3 F (36.8 C) 97.6 F (36.4 C) (!) 97.4 F (36.3 C) 97.7 F (36.5 C)  TempSrc: Oral Oral Oral Oral  SpO2: 96% 96% 98% 100%  Weight:  47.7 kg    Height:        Intake/Output Summary (Last 24 hours) at 08/01/2020 1253 Last data filed at 08/01/2020 1100 Gross per 24 hour  Intake 269.74 ml  Output 950 ml  Net -680.26 ml   Last 3 Weights 08/01/2020 07/31/2020 07/30/2020  Weight (lbs) 105 lb 3.2 oz 106 lb 14.8 oz 110 lb 3.7 oz  Weight (kg) 47.718 kg 48.5 kg 50 kg      Telemetry    Paced rhythm with frequent PVCs - Personally Reviewed  ECG    No new since 10/1 - Personally Reviewed  Physical Exam   GEN: No acute distress.  Frail appearing elderly woman, hard of hearing Neck: No JVD Cardiac: RRR, no rubs, or gallops. 2/6 systolic murmur Respiratory: Clear to  auscultation in upper fields, slightly diminished at bases, improved GI: Soft, nontender, non-distended  MS: No edema; No deformity. Neuro:  Nonfocal  Psych: Normal affect   Labs    High Sensitivity Troponin:   Recent Labs  Lab 07/30/20 0835 07/30/20 1046 07/30/20 1148 07/30/20 1453 07/30/20 2147  TROPONINIHS 41* 101* 117* 131* 95*      Chemistry Recent Labs  Lab 07/30/20 0835 07/31/20 0732 08/01/20 0323  NA 137 135 137  K 4.8 3.6 3.5  CL 105 101 99  CO2 20* 24 24  GLUCOSE 183* 86 89  BUN 31* 36* 36*  CREATININE 1.60* 1.84* 1.90*  CALCIUM 8.9 9.1 9.0  PROT 7.1  --   --   ALBUMIN 3.7  --   --   AST 105*  --   --   ALT 69*  --   --   ALKPHOS 65  --   --   BILITOT 1.0  --   --   GFRNONAA 28* 24* 23*  GFRAA 33* 28* 27*  ANIONGAP 12 10 14      Hematology Recent Labs  Lab 07/30/20 0835 07/31/20 0732 08/01/20 0323  WBC 12.8* 11.2* 10.2  RBC 3.87 3.21* 3.41*  HGB 11.4* 9.7* 10.2*  HCT 35.9* 28.5* 29.2*  MCV 92.8 88.8 85.6  MCH 29.5 30.2 29.9  MCHC 31.8 34.0 34.9  RDW 13.0 13.0 12.8  PLT 343 293 338    BNP Recent Labs  Lab 07/30/20 0835  BNP 464.3*     DDimer No results for input(s): DDIMER in the last 168 hours.   Radiology    DG Chest 2 View  Result Date: 07/31/2020 CLINICAL DATA:  Pneumonia, acute CHF EXAM: CHEST - 2 VIEW COMPARISON:  07/30/2020 FINDINGS: Patchy bilateral lower lobe opacities, likely atelectasis, pneumonia not excluded. Mild interstitial edema. Small right and small to moderate left pleural effusions. No pneumothorax. Cardiomegaly. Right subclavian pacemaker. Thoracic aortic atherosclerosis. Lower thoracic vertebral augmentation. IMPRESSION: Cardiomegaly with mild interstitial edema. Small right and small to moderate left pleural effusions. Patchy bilateral lower lobe opacities, likely atelectasis, pneumonia not excluded. Electronically Signed   By: Julian Hy M.D.   On: 07/31/2020 17:48   ECHOCARDIOGRAM COMPLETE  Result  Date: 07/30/2020    ECHOCARDIOGRAM REPORT   Patient Name:   Courtney Manning Date of Exam: 07/30/2020 Medical Rec #:  [id number]     Height:       64.0 in Accession #:    [id number]    Weight:       112.0 lb Date of Birth:  04/03/1929      BSA:          1.529 m Patient Age:    69 years      BP:           119/60 mmHg Patient Gender: F             HR:           56 bpm. Exam Location:  [facility] Procedure: 2D Echo, Cardiac Doppler and Color Doppler Indications:     CHF- acute diastolic 865.78  History:         Patient has no prior history of Echocardiogram examinations.                  Risk Factors:Hypertension. Anemia.  Sonographer:     Sherrie Sport RDCS (AE) Referring Phys:  [place] Diagnosing Phys: Ida Rogue MD IMPRESSIONS  1. Left ventricular ejection fraction, by estimation, is 55 to 60%. The left ventricle has normal function. The left ventricle has no regional wall motion abnormalities. Left ventricular diastolic parameters are consistent with Grade I diastolic dysfunction (impaired relaxation).  2. Right ventricular systolic function is normal. The right ventricular size is normal. There is moderately elevated pulmonary artery systolic pressure. The estimated right ventricular systolic pressure is 46.9 mmHg.  3. Moderate to severe mitral valve regurgitation.  4. Tricuspid valve regurgitation is moderate.  5. Mild aortic valve stenosis.  6. Left atrial size was mildly dilated.  7. Left pleural effusion. FINDINGS  Left Ventricle: Left ventricular ejection fraction, by estimation, is 55 to 60%. The left ventricle has normal function. The left ventricle has no regional wall motion abnormalities. The left ventricular internal cavity size was normal in size. There is  no left ventricular hypertrophy. Left ventricular diastolic parameters are consistent with Grade I diastolic dysfunction (impaired relaxation). Right Ventricle: The right ventricular size is normal. No increase in right ventricular wall  thickness. Right ventricular systolic function is normal. There is moderately elevated pulmonary artery systolic pressure. The tricuspid regurgitant velocity is 3.41 m/s, and with an assumed right atrial pressure of 5 mmHg, the estimated right ventricular systolic pressure is 62.9 mmHg. Left Atrium: Left atrial size  was mildly dilated. Right Atrium: Right atrial size was normal in size. Pericardium: There is no evidence of pericardial effusion. Mitral Valve: The mitral valve is normal in structure. Moderate to severe mitral valve regurgitation. No evidence of mitral valve stenosis. Tricuspid Valve: The tricuspid valve is normal in structure. Tricuspid valve regurgitation is moderate . No evidence of tricuspid stenosis. Aortic Valve: The aortic valve is normal in structure. Aortic valve regurgitation is not visualized. Mild aortic stenosis is present. Aortic valve mean gradient measures 9.3 mmHg. Aortic valve peak gradient measures 17.0 mmHg. Aortic valve area, by VTI measures 1.21 cm. Pulmonic Valve: The pulmonic valve was normal in structure. Pulmonic valve regurgitation is not visualized. No evidence of pulmonic stenosis. Aorta: The aortic root is normal in size and structure. Venous: The inferior vena cava is normal in size with greater than 50% respiratory variability, suggesting right atrial pressure of 3 mmHg. IAS/Shunts: No atrial level shunt detected by color flow Doppler. Additional Comments: A pacer wire is visualized.  LEFT VENTRICLE PLAX 2D LVIDd:         3.88 cm  Diastology LVIDs:         2.49 cm  LV e' medial:  4.90 cm/s LV PW:         1.08 cm  LV e' lateral: 5.11 cm/s LV IVS:        1.06 cm LVOT diam:     2.00 cm LV SV:         46 LV SV Index:   30 LVOT Area:     3.14 cm  RIGHT VENTRICLE RV Basal diam:  3.32 cm RV S prime:     15.10 cm/s TAPSE (M-mode): 2.7 cm LEFT ATRIUM             Index       RIGHT ATRIUM           Index LA diam:        3.90 cm 2.55 cm/m  RA Area:     16.70 cm LA Vol (A2C):    64.4 ml 42.12 ml/m RA Volume:   42.70 ml  27.93 ml/m LA Vol (A4C):   59.9 ml 39.17 ml/m LA Biplane Vol: 63.4 ml 41.46 ml/m  AORTIC VALVE                    PULMONIC VALVE AV Area (Vmax):    1.10 cm     PV Vmax:        0.68 m/s AV Area (Vmean):   1.15 cm     PV Peak grad:   1.8 mmHg AV Area (VTI):     1.21 cm     RVOT Peak grad: 2 mmHg AV Vmax:           206.33 cm/s AV Vmean:          137.667 cm/s AV VTI:            0.384 m AV Peak Grad:      17.0 mmHg AV Mean Grad:      9.3 mmHg LVOT Vmax:         72.40 cm/s LVOT Vmean:        50.300 cm/s LVOT VTI:          0.148 m LVOT/AV VTI ratio: 0.39  AORTA Ao Root diam: 2.40 cm TRICUSPID VALVE TR Peak grad:   46.5 mmHg TR Vmax:        341.00 cm/s  SHUNTS Systemic VTI:  0.15 m Systemic Diam: 2.00 cm Ida Rogue MD Electronically signed by Ida Rogue MD Signature Date/Time: 07/30/2020/3:08:09 PM    Final     Cardiac Studies   2D echo 07/30/2020: 1. Left ventricular ejection fraction, by estimation, is 55 to 60%. The  left ventricle has normal function. The left ventricle has no regional  wall motion abnormalities. Left ventricular diastolic parameters are  consistent with Grade I diastolic  dysfunction (impaired relaxation).  2. Right ventricular systolic function is normal. The right ventricular  size is normal. There is moderately elevated pulmonary artery systolic  pressure. The estimated right ventricular systolic pressure is 79.1 mmHg.  3. Moderate to severe mitral valve regurgitation.  4. Tricuspid valve regurgitation is moderate.  5. Mild aortic valve stenosis.  6. Left atrial size was mildly dilated.  7. Left pleural effusion.  Patient Profile     86 y.o. female with history of syncope, complete heart block status post [employer] dual-chamber PPMon 4/13/2016followed at [facility], CKD stage III, lumbar stenosis with radiculopathy, HTN, HLD, and hypothyroidismwho is being seen today for the evaluation of dyspneaat the request of Dr.  Francine Graven.  Assessment & Plan    Acute hypoxic respiratory failure Acute diastolic heart failure Pulmonary hypertension Moderate-severe MR Moderate TR Acute kidney injury on chronic kidney disease, stage 3B (based on labs from 2018) -high suspicion for flash pulmonary edema as precipitating cause given presentation with chest pain, elevated blood pressure, and bilateral pulmonary edema -daughter understandably had many questions as to what caused this. Etiology unclear to me. Discussed using pulse ox at home. Likely will not need standing lasix at home, suspect she may be able to use weight based or symptom based dosing. Discussed signs/symptoms to watch for -she was not on home O2 prior. Discussed that this will either need to be weaned off or they will need to have O2 arranged for home, will defer to primary team -I/O not well captured, but weight down from 50 kg on admission to 47.7 kg today -Cr up again today. Changing to oral lasix today, likely can be PRN at discharge  Elevated troponin: -now downtrending -in the setting of respiratory distress, suspect this is demand ischemia -no chest pain -stop heparin after 48 hours -no statin for now given mildly elevated transaminases on presentation, would recheck LFTs prior to discharge -continue aspirin 81 mg daily  Hypertension: -was hypotensive 10/2, now improved with decreasing amlodipine to 5 mg daily. May need to stop amlodipine completely if hypotension recurs.  Total time of encounter: 40 minutes total time of encounter, including 30 minutes spent in face-to-face patient care. This time includes coordination of care and counseling regarding cardiac workup thus far, treatment, management at home. Remainder of non-face-to-face time involved reviewing chart documents/testing relevant to the patient encounter and documentation in the medical record.  Buford Dresser, MD, PhD [facility]   For questions or  updates, please contact [place] Please consult [email or web address] for contact info under        Signed, Buford Dresser, MD  08/01/2020, 12:53 PM

## 2020-08-01 NOTE — Progress Notes (Signed)
PROGRESS NOTE    [name]  OQH:[id number] DOB: 03/03/1929 DOA: 07/30/2020 PCP: Idelle Crouch, MD   Brief Narrative:  Courtney Manning is a 86 y.o. female with medical history significant forhypertension, spinal stenosis, thyroid disease, glaucoma, GERD who was brought into the ER by EMS for evaluation of mental status changes and hypoxia. Patient was said to have complained of chest pain oneday prior to heradmission and on the morning of her admission was found lethargic by family members who called EMS. Patient was reported to have room air pulse oximetry in the 60s and was placed on CPAP. Her blood pressure in the field was 167/84 and EMS placed 2 inch of Nitropaste on the patient prior to arrival to the ER.  He was admitted to the hospital for acute on chronic diastolic CHF and acute hypoxemic respiratory failure.  She was treated with IV Lasix.  She initially required BiPAP for respiratory failure and was transitioned to oxygen via nasal cannula.  She was seen in consultation by the cardiologist.  Elevated troponins was suspected to be due to demand ischemia.  However, cardiologist recommended treatment with IV heparin infusion.  Urine culture showed E. coli and is to clear whether this is true UTI or colonization.  She was treated with IV Rocephin.  Blood culture also showed staph epidermidis which is probably a contaminant.  She was treated with IV vancomycin pending repeat blood culture.  Subjective: Patient has no new complaint today.  Wants to go home.  Accompanied by her daughter at bedside.  Daughter and her son takes care of her at home.   Assessment & Plan:   Principal Problem:   Acute CHF (congestive heart failure) (HCC) Active Problems:   Elevated troponin   Hypothyroidism   Hypertension   GERD (gastroesophageal reflux disease)   CKD stage 4 secondary to hypertension ([place])   Acute respiratory failure (HCC)  Acute on chronic diastolic heart failure.   Cardiology is following, appreciate their recommendations.  Initially requiring BiPAP, now saturating well on 2 L. 2D echo showed EF estimated at 55 to 54%, grade 1 diastolic dysfunction, moderately elevated pulmonary artery systolic pressure, moderate to severe MR, moderate TR, mild aortic stenosis -Cardiology switched from IV to p.o. Lasix as there was some increase in creatinine, according to cardiology note Lasix can be made as needed on discharge. -Wean off from oxygen.  Elevated troponin.  Most likely secondary to demand ischemia.  No chest pain.  Cardiology started her on IV Heparin for 48 hours which will be discontinued today.  Hypertension.  Blood pressure within goal today.  Her home dose of amlodipine was decreased from 10-5 due to borderline hypotension. -Continue amlodipine at 5 mg daily.  UTI.  Urine culture with pansensitive E. Coli. -Switch ceftriaxone with Keflex to complete a 5-day course.  1 out of 4 bottles with staph epidermidis.  Most likely a contaminant.  She was started on vancomycin and repeat blood cultures were drawn which remain negative. -Discontinue vancomycin.  Hypothyroidism. -Continue home dose of Synthroid.  Objective: Vitals:   08/01/20 0454 08/01/20 0753 08/01/20 1154 08/01/20 1608  BP: 138/61 (!) 117/50 (!) 124/48 (!) 124/44  Pulse: 85 81 80 85  Resp: 16 16 16 16   Temp: 97.6 F (36.4 C) (!) 97.4 F (36.3 C) 97.7 F (36.5 C) 97.6 F (36.4 C)  TempSrc: Oral Oral Oral Oral  SpO2: 96% 98% 100% 93%  Weight: 47.7 kg     Height:  Intake/Output Summary (Last 24 hours) at 08/01/2020 2014 Last data filed at 08/01/2020 1100 Gross per 24 hour  Intake 157.46 ml  Output 700 ml  Net -542.54 ml   Filed Weights   07/30/20 2031 07/31/20 0405 08/01/20 0454  Weight: 50 kg 48.5 kg 47.7 kg    Examination:  General exam: Frail elderly lady, appears calm and comfortable  Respiratory system: Clear to auscultation. Respiratory effort  normal. Cardiovascular system: S1 & S2 heard, RRR. No JVD, murmurs, Gastrointestinal system: Soft, nontender, nondistended, bowel sounds positive. Central nervous system: Alert and oriented. No focal neurological deficits. Extremities: No edema, no cyanosis, pulses intact and symmetrical. Psychiatry: Judgement and insight appear normal. Mood & affect appropriate.    DVT prophylaxis: Heparin Code Status: Full Family Communication: Daughter was updated at bedside. Disposition Plan:  Status is: Inpatient  Remains inpatient appropriate because:Inpatient level of care appropriate due to severity of illness   Dispo: The patient is from: Home              Anticipated d/c is to: Home              Anticipated d/c date is: 1 day              Patient currently is not medically stable to d/c.  Consultants:   Cardiology  Procedures:  Antimicrobials:  Keflex  Data Reviewed: I have personally reviewed following labs and imaging studies  CBC: Recent Labs  Lab 07/30/20 0835 07/31/20 0732 08/01/20 0323  WBC 12.8* 11.2* 10.2  NEUTROABS 10.8*  --   --   HGB 11.4* 9.7* 10.2*  HCT 35.9* 28.5* 29.2*  MCV 92.8 88.8 85.6  PLT 343 293 678   Basic Metabolic Panel: Recent Labs  Lab 07/30/20 0835 07/30/20 1148 07/31/20 0732 08/01/20 0323  NA 137  --  135 137  K 4.8  --  3.6 3.5  CL 105  --  101 99  CO2 20*  --  24 24  GLUCOSE 183*  --  86 89  BUN 31*  --  36* 36*  CREATININE 1.60*  --  1.84* 1.90*  CALCIUM 8.9  --  9.1 9.0  MG  --  2.0  --   --    GFR: Estimated Creatinine Clearance: 15.1 mL/min (A) (by C-G formula based on SCr of 1.9 mg/dL (H)). Liver Function Tests: Recent Labs  Lab 07/30/20 0835  AST 105*  ALT 69*  ALKPHOS 65  BILITOT 1.0  PROT 7.1  ALBUMIN 3.7   No results for input(s): LIPASE, AMYLASE in the last 168 hours. No results for input(s): AMMONIA in the last 168 hours. Coagulation Profile: Recent Labs  Lab 07/30/20 0904  INR 1.1   Cardiac  Enzymes: No results for input(s): CKTOTAL, CKMB, CKMBINDEX, TROPONINI in the last 168 hours. BNP (last 3 results) No results for input(s): PROBNP in the last 8760 hours. HbA1C: No results for input(s): HGBA1C in the last 72 hours. CBG: No results for input(s): GLUCAP in the last 168 hours. Lipid Profile: No results for input(s): CHOL, HDL, LDLCALC, TRIG, CHOLHDL, LDLDIRECT in the last 72 hours. Thyroid Function Tests: No results for input(s): TSH, T4TOTAL, FREET4, T3FREE, THYROIDAB in the last 72 hours. Anemia Panel: No results for input(s): VITAMINB12, FOLATE, FERRITIN, TIBC, IRON, RETICCTPCT in the last 72 hours. Sepsis Labs: Recent Labs  Lab 07/30/20 0835 07/30/20 1046  PROCALCITON  --  0.17  LATICACIDVEN 3.3* 1.7    Recent Results (from the past 240 hour(s))  Blood Culture (routine x 2)     Status: None (Preliminary result)   Collection Time: 07/30/20  8:35 AM   Specimen: BLOOD  Result Value Ref Range Status   Specimen Description BLOOD LEFT ARM  Final   Special Requests   Final    BOTTLES DRAWN AEROBIC AND ANAEROBIC Blood Culture adequate volume   Culture   Final    NO GROWTH 2 DAYS Performed at [facility], [address], [place], [place] [address]    Report Status PENDING  Incomplete  Blood Culture (routine x 2)     Status: Abnormal (Preliminary result)   Collection Time: 07/30/20  8:35 AM   Specimen: BLOOD  Result Value Ref Range Status   Specimen Description   Final    BLOOD RIGHT ARM Performed at [facility], [address], [place], [place] [address]    Special Requests   Final    BOTTLES DRAWN AEROBIC AND ANAEROBIC Blood Culture adequate volume Performed at [facility], [address] [place], [place] [address]    Culture  Setup Time   Final    GRAM POSITIVE COCCI AEROBIC BOTTLE ONLY CRITICAL RESULT CALLED TO, READ BACK BY AND VERIFIED WITH: [place] AT [place] ON 07/31/20 SNG    Culture (A)  Final    STAPHYLOCOCCUS  EPIDERMIDIS THE SIGNIFICANCE OF ISOLATING THIS ORGANISM FROM A SINGLE SET OF BLOOD CULTURES WHEN MULTIPLE SETS ARE DRAWN IS UNCERTAIN. PLEASE NOTIFY THE MICROBIOLOGY DEPARTMENT WITHIN ONE WEEK IF SPECIATION AND SENSITIVITIES ARE REQUIRED. Performed at [facility], [address], [place], [place] [address]    Report Status PENDING  Incomplete  Urine culture     Status: Abnormal   Collection Time: 07/30/20  8:35 AM   Specimen: Urine, Catheterized  Result Value Ref Range Status   Specimen Description   Final    URINE, CATHETERIZED Performed at [facility], [address] [place], [place] [address]    Special Requests   Final    NONE Performed at [facility], [address] [place], [place] [address]    Culture >=100,000 COLONIES/mL ESCHERICHIA COLI (A)  Final   Report Status 08/01/2020 FINAL  Final   Organism ID, Bacteria ESCHERICHIA COLI (A)  Final      Susceptibility   Escherichia coli - MIC*    AMPICILLIN 4 SENSITIVE Sensitive     CEFAZOLIN <=4 SENSITIVE Sensitive     CEFTRIAXONE <=0.25 SENSITIVE Sensitive     CIPROFLOXACIN <=0.25 SENSITIVE Sensitive     GENTAMICIN <=1 SENSITIVE Sensitive     IMIPENEM <=0.25 SENSITIVE Sensitive     NITROFURANTOIN <=16 SENSITIVE Sensitive     TRIMETH/SULFA <=20 SENSITIVE Sensitive     AMPICILLIN/SULBACTAM <=2 SENSITIVE Sensitive     PIP/TAZO <=4 SENSITIVE Sensitive     * >=100,000 COLONIES/mL ESCHERICHIA COLI  Blood Culture ID Panel (Reflexed)     Status: Abnormal   Collection Time: 07/30/20  8:35 AM  Result Value Ref Range Status   Enterococcus faecalis NOT DETECTED NOT DETECTED Final   Enterococcus Faecium NOT DETECTED NOT DETECTED Final   Listeria monocytogenes NOT DETECTED NOT DETECTED Final   Staphylococcus species DETECTED (A) NOT DETECTED Final    Comment: CRITICAL RESULT CALLED TO, READ BACK BY AND VERIFIED WITH: SCOTT HALL AT 0612 ON 07/31/20 SNG    Staphylococcus aureus (BCID) NOT DETECTED NOT  DETECTED Final   Staphylococcus epidermidis DETECTED (A) NOT DETECTED Final    Comment: Methicillin (oxacillin)  resistant coagulase negative staphylococcus. Possible blood culture contaminant (unless isolated from more than one blood culture draw or clinical case suggests pathogenicity). No antibiotic treatment is indicated for blood  culture contaminants. CRITICAL RESULT CALLED TO, READ BACK BY AND VERIFIED WITH: SCOTT HALL AT 0612 ON 07/31/20 SNG    Staphylococcus lugdunensis NOT DETECTED NOT DETECTED Final   Streptococcus species NOT DETECTED NOT DETECTED Final   Streptococcus agalactiae NOT DETECTED NOT DETECTED Final   Streptococcus pneumoniae NOT DETECTED NOT DETECTED Final   Streptococcus pyogenes NOT DETECTED NOT DETECTED Final   A.calcoaceticus-baumannii NOT DETECTED NOT DETECTED Final   Bacteroides fragilis NOT DETECTED NOT DETECTED Final   Enterobacterales NOT DETECTED NOT DETECTED Final   Enterobacter cloacae complex NOT DETECTED NOT DETECTED Final   Escherichia coli NOT DETECTED NOT DETECTED Final   Klebsiella aerogenes NOT DETECTED NOT DETECTED Final   Klebsiella oxytoca NOT DETECTED NOT DETECTED Final   Klebsiella pneumoniae NOT DETECTED NOT DETECTED Final   Proteus species NOT DETECTED NOT DETECTED Final   Salmonella species NOT DETECTED NOT DETECTED Final   Serratia marcescens NOT DETECTED NOT DETECTED Final   Haemophilus influenzae NOT DETECTED NOT DETECTED Final   Neisseria meningitidis NOT DETECTED NOT DETECTED Final   Pseudomonas aeruginosa NOT DETECTED NOT DETECTED Final   Stenotrophomonas maltophilia NOT DETECTED NOT DETECTED Final   Candida albicans NOT DETECTED NOT DETECTED Final   Candida auris NOT DETECTED NOT DETECTED Final   Candida glabrata NOT DETECTED NOT DETECTED Final   Candida krusei NOT DETECTED NOT DETECTED Final   Candida parapsilosis NOT DETECTED NOT DETECTED Final   Candida tropicalis NOT DETECTED NOT DETECTED Final   Cryptococcus  neoformans/gattii NOT DETECTED NOT DETECTED Final   Methicillin resistance mecA/C DETECTED (A) NOT DETECTED Final    Comment: CRITICAL RESULT CALLED TO, READ BACK BY AND VERIFIED WITH: SCOTT HALL AT 0612 ON 07/31/20 SNG Performed at [facility], [address] [place], [place] [address]   Respiratory Panel by RT PCR (Flu A&B, Covid) - Nasopharyngeal Swab     Status: None   Collection Time: 07/30/20  8:36 AM   Specimen: Nasopharyngeal Swab  Result Value Ref Range Status   SARS Coronavirus 2 by RT PCR NEGATIVE NEGATIVE Final    Comment: (NOTE) SARS-CoV-2 target nucleic acids are NOT DETECTED.  The SARS-CoV-2 RNA is generally detectable in upper respiratoy specimens during the acute phase of infection. The lowest concentration of SARS-CoV-2 viral copies this assay can detect is 131 copies/mL. A negative result does not preclude SARS-Cov-2 infection and should not be used as the sole basis for treatment or other patient management decisions. A negative result may occur with  improper specimen collection/handling, submission of specimen other than nasopharyngeal swab, presence of viral mutation(s) within the areas targeted by this assay, and inadequate number of viral copies (<131 copies/mL). A negative result must be combined with clinical observations, patient history, and epidemiological information. The expected result is Negative.  Fact Sheet for Patients:  PinkCheek.be  Fact Sheet for Healthcare Providers:  GravelBags.it  This test is no t yet approved or cleared by the [place] FDA and  has been authorized for detection and/or diagnosis of SARS-CoV-2 by FDA under an Emergency Use Authorization (EUA). This EUA will remain  in effect (meaning this test can be used) for the duration of the COVID-19 declaration under Section 564(b)(1) of the Act, 21 U.S.C. section 360bbb-3(b)(1), unless the authorization is  terminated or revoked sooner.     Influenza  A by PCR NEGATIVE NEGATIVE Final   Influenza B by PCR NEGATIVE NEGATIVE Final    Comment: (NOTE) The Xpert Xpress SARS-CoV-2/FLU/RSV assay is intended as an aid in  the diagnosis of influenza from Nasopharyngeal swab specimens and  should not be used as a sole basis for treatment. Nasal washings and  aspirates are unacceptable for Xpert Xpress SARS-CoV-2/FLU/RSV  testing.  Fact Sheet for Patients: PinkCheek.be  Fact Sheet for Healthcare Providers: GravelBags.it  This test is not yet approved or cleared by the [place] FDA and  has been authorized for detection and/or diagnosis of SARS-CoV-2 by  FDA under an Emergency Use Authorization (EUA). This EUA will remain  in effect (meaning this test can be used) for the duration of the  Covid-19 declaration under Section 564(b)(1) of the Act, 21  U.S.C. section 360bbb-3(b)(1), unless the authorization is  terminated or revoked. Performed at [facility], [address] [place], [place] [address]   Culture, blood (single) w Reflex to ID Panel     Status: None (Preliminary result)   Collection Time: 08/01/20  3:23 AM   Specimen: BLOOD LEFT HAND  Result Value Ref Range Status   Specimen Description BLOOD LEFT HAND  Final   Special Requests   Final    BOTTLES DRAWN AEROBIC AND ANAEROBIC Blood Culture adequate volume   Culture   Final    NO GROWTH < 12 HOURS Performed at [facility], [address], [place], [place] [address]    Report Status PENDING  Incomplete     Radiology Studies: DG Chest 2 View  Result Date: 07/31/2020 CLINICAL DATA:  Pneumonia, acute CHF EXAM: CHEST - 2 VIEW COMPARISON:  07/30/2020 FINDINGS: Patchy bilateral lower lobe opacities, likely atelectasis, pneumonia not excluded. Mild interstitial edema. Small right and small to moderate left pleural effusions. No pneumothorax. Cardiomegaly.  Right subclavian pacemaker. Thoracic aortic atherosclerosis. Lower thoracic vertebral augmentation. IMPRESSION: Cardiomegaly with mild interstitial edema. Small right and small to moderate left pleural effusions. Patchy bilateral lower lobe opacities, likely atelectasis, pneumonia not excluded. Electronically Signed   By: Julian Hy M.D.   On: 07/31/2020 17:48    Scheduled Meds: . amLODipine  5 mg Oral Daily  . aspirin EC  81 mg Oral Daily  . cephALEXin  500 mg Oral Q12H  . famotidine  20 mg Oral QHS  . oxyCODONE  5 mg Oral QHS  . sodium chloride flush  3 mL Intravenous Q12H  . thyroid  30 mg Oral QAC breakfast   Continuous Infusions: . sodium chloride Stopped (08/01/20 0008)     LOS: 2 days   Time spent: 30 minutes.  Lorella Nimrod, MD [facility]  If 7PM-7AM, please contact night-coverage [email or web address]  08/01/2020, 8:14 PM   This record has been created using Systems analyst. Errors have been sought and corrected,but may not always be located. Such creation errors do not reflect on the standard of care.

## 2020-08-01 NOTE — Progress Notes (Signed)
ANTICOAGULATION CONSULT NOTE  Pharmacy Consult for Heparin drip Indication: chest pain/ACS  Patient Measurements: Height: 5\' 5"  (165.1 cm) Weight: 47.7 kg (105 lb 3.2 oz) IBW/kg (Calculated) : 57 Heparin Dosing Weight: 50.8 kg  Labs: Recent Labs     0000 07/30/20 0835 07/30/20 0904 07/30/20 1046 07/30/20 1148 07/30/20 1453 07/30/20 2147 07/30/20 2147 07/31/20 0732 07/31/20 0732 07/31/20 1500 07/31/20 2149 08/01/20 0323  HGB   < > 11.4*  --   --   --   --   --   --  9.7*  --   --   --  10.2*  HCT  --  35.9*  --   --   --   --   --   --  28.5*  --   --   --  29.2*  PLT  --  343  --   --   --   --   --   --  293  --   --   --  338  APTT  --   --  31  --   --   --   --   --   --   --   --   --   --   LABPROT  --   --  13.8  --   --   --   --   --   --   --   --   --   --   INR  --   --  1.1  --   --   --   --   --   --   --   --   --   --   HEPARINUNFRC  --   --   --   --   --   --  0.31   < > 0.16*   < > 0.68 0.59 0.40  CREATININE  --  1.60*  --   --   --   --   --   --  1.84*  --   --   --  1.90*  TROPONINIHS  --  41*  --    < > 117* 131* 95*  --   --   --   --   --   --    < > = values in this interval not displayed.    Estimated Creatinine Clearance: 15.1 mL/min (A) (by C-G formula based on SCr of 1.9 mg/dL (H)).  Medical History: Past Medical History:  Diagnosis Date  . Anemia   . Cervical cancer ([place])    Uterine Cancer  . Constipation   . GERD (gastroesophageal reflux disease)   . Glaucoma   . Hypertension   . Hypothyroidism   . Insomnia   . Spinal stenosis   . Thyroid disease    Infusions:  . sodium chloride Stopped (08/01/20 0008)  . cefTRIAXone (ROCEPHIN)  IV Stopped (07/31/20 2308)  . heparin 750 Units/hr (08/01/20 0121)  . [START ON 08/02/2020] vancomycin      Assessment: 86 yo F to start Heparin drip for ACS/STEMI.  Hgb 11.4  Plt 343  INR 1.1  APTT 31 No anticoagulants PTA per Med Rec  10/1 at 2147 HL = 0.31, lower end of therapeutic range.  Continue heparin drip at 600 units/hr  10/2 at 0732 HL = 0.16 subtherapeutic.Will order bolus of 1500 units and increase heparin drip to 800 units/hr. Hgb 9.7 plt 293  Goal of Therapy:  Heparin level 0.3-0.7  units/ml Monitor platelets by anticoagulation protocol: Yes   Plan:  --10/3 at 0323 HL = 0.40 therapeutic x 3. Will continue heparin drip at 750 units/hr.  --check HL in am w/ CBC  --Daily CBC per protocol  [name] A 08/01/2020,5:57 AM

## 2020-08-02 DIAGNOSIS — E039 Hypothyroidism, unspecified: Secondary | ICD-10-CM

## 2020-08-02 LAB — CBC
HCT: 31.3 % — ABNORMAL LOW (ref 36.0–46.0)
Hemoglobin: 10.9 g/dL — ABNORMAL LOW (ref 12.0–15.0)
MCH: 29.9 pg (ref 26.0–34.0)
MCHC: 34.8 g/dL (ref 30.0–36.0)
MCV: 86 fL (ref 80.0–100.0)
Platelets: 384 10*3/uL (ref 150–400)
RBC: 3.64 MIL/uL — ABNORMAL LOW (ref 3.87–5.11)
RDW: 12.7 % (ref 11.5–15.5)
WBC: 9.5 10*3/uL (ref 4.0–10.5)
nRBC: 0 % (ref 0.0–0.2)

## 2020-08-02 LAB — CULTURE, BLOOD (ROUTINE X 2): Special Requests: ADEQUATE

## 2020-08-02 LAB — BASIC METABOLIC PANEL
Anion gap: 11 (ref 5–15)
BUN: 32 mg/dL — ABNORMAL HIGH (ref 8–23)
CO2: 29 mmol/L (ref 22–32)
Calcium: 9.1 mg/dL (ref 8.9–10.3)
Chloride: 99 mmol/L (ref 98–111)
Creatinine, Ser: 1.64 mg/dL — ABNORMAL HIGH (ref 0.44–1.00)
GFR calc Af Amer: 32 mL/min — ABNORMAL LOW (ref >60)
GFR calc non Af Amer: 27 mL/min — ABNORMAL LOW (ref >60)
Glucose, Bld: 91 mg/dL (ref 70–99)
Potassium: 3.8 mmol/L (ref 3.5–5.1)
Sodium: 139 mmol/L (ref 135–145)

## 2020-08-02 MED ORDER — CEPHALEXIN 500 MG PO CAPS: 500.0000 mg | ORAL_CAPSULE | Freq: Two times a day (BID) | ORAL | 0 refills | Status: AC

## 2020-08-02 MED ORDER — FUROSEMIDE 20 MG PO TABS: 20.0000 mg | ORAL_TABLET | Freq: Every day | ORAL | 0 refills | Status: AC | PRN

## 2020-08-02 MED ORDER — AMLODIPINE BESYLATE 5 MG PO TABS: 5.0000 mg | ORAL_TABLET | Freq: Every day | ORAL | 0 refills | Status: AC

## 2020-08-02 NOTE — Progress Notes (Signed)
Progress Note  Patient Name: Courtney Manning Date of Encounter: 08/02/2020  Primary Cardiologist: [facility]  Subjective   No chest pain, dyspnea, dizziness, presyncope, or syncope. Planning for discharge today. Now on room air. Renal function improved with holding of Lasix.   Inpatient Medications    Scheduled Meds: . amLODipine  5 mg Oral Daily  . aspirin EC  81 mg Oral Daily  . cephALEXin  500 mg Oral Q12H  . famotidine  20 mg Oral QHS  . oxyCODONE  5 mg Oral QHS  . sodium chloride flush  3 mL Intravenous Q12H  . thyroid  30 mg Oral QAC breakfast   Continuous Infusions: . sodium chloride Stopped (08/01/20 0008)   PRN Meds: sodium chloride, acetaminophen, menthol-cetylpyridinium, ondansetron (ZOFRAN) IV, sodium chloride flush   Vital Signs    Vitals:   08/02/20 0359 08/02/20 0401 08/02/20 0824 08/02/20 1122  BP: (!) 130/58  (!) 101/48 (!) 114/57  Pulse: 96  78 79  Resp: 17  17 16   Temp: 97.8 F (36.6 C)  98.6 F (37 C) 98.2 F (36.8 C)  TempSrc: Oral     SpO2: 94%  95% 97%  Weight:  48.1 kg    Height:        Intake/Output Summary (Last 24 hours) at 08/02/2020 1350 Last data filed at 08/02/2020 0502 Gross per 24 hour  Intake --  Output 650 ml  Net -650 ml   Filed Weights   07/31/20 0405 08/01/20 0454 08/02/20 0401  Weight: 48.5 kg 47.7 kg 48.1 kg    Telemetry    Paced - Personally Reviewed  ECG    No new tracings - Personally Reviewed  Physical Exam   GEN: No acute distress.   Neck: No JVD. Cardiac: RRR, II/VI systolic murmur, no rubs, or gallops.  Respiratory: Clear to auscultation bilaterally.  GI: Soft, nontender, non-distended.   MS: No edema; No deformity. Neuro:  Alert and oriented x 3; Nonfocal.  Psych: Normal affect.  Labs    Chemistry Recent Labs  Lab 07/30/20 0835 07/30/20 0835 07/31/20 0732 08/01/20 0323 08/02/20 0534  NA 137   < > 135 137 139  K 4.8   < > 3.6 3.5 3.8  CL 105   < > 101 99 99  CO2 20*   < > 24 24 29    GLUCOSE 183*   < > 86 89 91  BUN 31*   < > 36* 36* 32*  CREATININE 1.60*   < > 1.84* 1.90* 1.64*  CALCIUM 8.9   < > 9.1 9.0 9.1  PROT 7.1  --   --   --   --   ALBUMIN 3.7  --   --   --   --   AST 105*  --   --   --   --   ALT 69*  --   --   --   --   ALKPHOS 65  --   --   --   --   BILITOT 1.0  --   --   --   --   GFRNONAA 28*   < > 24* 23* 27*  GFRAA 33*   < > 28* 27* 32*  ANIONGAP 12   < > 10 14 11    < > = values in this interval not displayed.     Hematology Recent Labs  Lab 07/31/20 0732 08/01/20 0323 08/02/20 0534  WBC 11.2* 10.2 9.5  RBC 3.21* 3.41* 3.64*  HGB 9.7* 10.2* 10.9*  HCT 28.5* 29.2* 31.3*  MCV 88.8 85.6 86.0  MCH 30.2 29.9 29.9  MCHC 34.0 34.9 34.8  RDW 13.0 12.8 12.7  PLT 293 338 384    Cardiac EnzymesNo results for input(s): TROPONINI in the last 168 hours. No results for input(s): TROPIPOC in the last 168 hours.   BNP Recent Labs  Lab 07/30/20 0835  BNP 464.3*     DDimer No results for input(s): DDIMER in the last 168 hours.   Radiology    DG Chest 2 View  Result Date: 07/31/2020 IMPRESSION: Cardiomegaly with mild interstitial edema. Small right and small to moderate left pleural effusions. Patchy bilateral lower lobe opacities, likely atelectasis, pneumonia not excluded. Electronically Signed   By: Julian Hy M.D.   On: 07/31/2020 17:48    Cardiac Studies   2D echo 07/30/2020: 1. Left ventricular ejection fraction, by estimation, is 55 to 60%. The  left ventricle has normal function. The left ventricle has no regional  wall motion abnormalities. Left ventricular diastolic parameters are  consistent with Grade I diastolic  dysfunction (impaired relaxation).  2. Right ventricular systolic function is normal. The right ventricular  size is normal. There is moderately elevated pulmonary artery systolic  pressure. The estimated right ventricular systolic pressure is 16.1 mmHg.  3. Moderate to severe mitral valve regurgitation.   4. Tricuspid valve regurgitation is moderate.  5. Mild aortic valve stenosis.  6. Left atrial size was mildly dilated.  7. Left pleural effusion.  Patient Profile     86 y.o. female with history of syncope, complete heart block status post [employer] dual-chamber PPMon 4/13/2016[facility], CKD stage III, lumbar stenosis with radiculopathy, HTN, HLD, and hypothyroidismwho is being seen today for the evaluation of dyspneaat the request of Dr. Francine Graven.  Assessment & Plan    1. Acute hypoxic respiratory distress/acute HFpEF/pulmonary hypertension: -Cannot exclude some degree of flash pulmonary edema leading to presentation -Improved -Weight down from 50 kg to 48 kg this admission  -Now back on room air -Lasix 20 mg prn at discharge, details discussed with patient's daughter  -Echo as above  2.Elevated troponin: -Initial high-sensitivity troponin 41 with a delta of 101 and peak of 131, now down trending -Echo with preserved LVSF -ASA -Defer initiating atorvastatin with transaminitis noted -With CKD, not a great candidate for invasive ischemic evaluation at this time  3.Complete heart block: -Status post [employer] PPM -Device appears to be functioning normally -Followed by [facility]  4.Acute on CKD stage III: -Improved  -Follow up as outpatient with nephrology   6.HTN:  -Blood pressure initially hypertensive upon arrival, though became hypotensive following diuresis and escalation of medical therapy -BP well controlled on amlodipine 5 mg, which will be continued   7. Anemia: -Management per IM  8. Transaminitis: -Likely secondary to congestive hepatopathy  -Status post diuresis as above -Follow up with PCP   For questions or updates, please contact [place] Please consult [email or web address] for contact info under Cardiology/STEMI.    Signed, Christell Faith, PA-C [place] Pager: [phone] 08/02/2020, 1:50 PM

## 2020-08-02 NOTE — Progress Notes (Signed)
Mobility Specialist - Progress Note   08/02/20 1445  Mobility  Activity Refused mobility  Mobility performed by Mobility specialist    Pt refused mobility session this afternoon d/t d/c.     Phyliss Hulick Mobility Specialist  08/02/20, 2:45 PM

## 2020-08-02 NOTE — Discharge Summary (Signed)
Physician Discharge Summary  Courtney Manning VCB:[id number] DOB: 19-Aug-1929 DOA: 07/30/2020  PCP: Idelle Crouch, MD  Admit date: 07/30/2020 Discharge date: 08/02/2020  Admitted From: Home Disposition: Home  Recommendations for Outpatient Follow-up:  1. Follow up with PCP in 1-2 weeks 2. Follow-up with cardiology. 3. Please obtain BMP/CBC in one week 4. Please follow up on the following pending results: None  Home Health: Yes Equipment/Devices: Rolling walker Discharge Condition: Stable CODE STATUS: Full Diet recommendation: Heart Healthy / Carb Modified   Brief/Interim Summary: Courtney Manning a 86 y.o.femalewith medical history significant forhypertension, spinal stenosis, thyroid disease, glaucoma, GERD who was brought into the ER by EMS for evaluation of mental status changes and hypoxia. Patient was said to have complained of chest pain oneday prior to heradmission and on the morning of her admission was found lethargic by family members who called EMS. Patient was reported to have room air pulse oximetry in the 60s and was placed on CPAP. Her blood pressure in the field was 167/84 and EMS placed 2 inch of Nitropaste on the patient prior to arrival to the ER.  She was admitted to the hospital for acute on chronic diastolic CHF and acute hypoxemic respiratory failure. She was treated with IV Lasix. She initially required BiPAP for respiratory failure and was transitioned to oxygen via nasal cannula. She was seen in consultation by the cardiologist. Elevated troponins was suspected to be due to demand ischemia. However, cardiologist recommended treatment with IV heparin infusion for 48 hours. 2D echo showed EF estimated at 55 to 38%, grade 1 diastolic dysfunction, moderately elevated pulmonary artery systolic pressure, moderate to severe MR, moderate TR, mild aortic stenosis.  She was weaned off from oxygen.  IV Lasix was discontinued secondary to increased in her creatinine.   Creatinine started improving with discontinuation of Lasix.  She was given Lasix as as needed and will follow up with heart failure clinic.  She also developed some hypotension.  Home dose of amlodipine was decreased to 5 mg daily.  Blood pressure was within goal on discharge  Urine culture showed E. coli and is to clear whether this is true UTI or colonization. She was treated with IV Rocephin and later transitioned to Keflex to complete a 5-day course. Blood culture also showed staph epidermidis in 1/4 bottle, repeat blood cultures remain negative which is probably a contaminant. She was treated with IV vancomycin for couple of days and then it was discontinued.  She will continue with rest of her home meds and follow-up with her providers.  Discharge Diagnoses:  Principal Problem:   Acute CHF (congestive heart failure) (HCC) Active Problems:   Elevated troponin   Hypothyroidism   Hypertension   GERD (gastroesophageal reflux disease)   CKD stage 4 secondary to hypertension ([place])   Acute respiratory failure [facility])   Discharge Instructions  Discharge Instructions    (HEART FAILURE PATIENTS) Call MD:  Anytime you have any of the following symptoms: 1) 3 pound weight gain in 24 hours or 5 pounds in 1 week 2) shortness of breath, with or without a dry hacking cough 3) swelling in the hands, feet or stomach 4) if you have to sleep on extra pillows at night in order to breathe.   Complete by: As directed    Diet - low sodium heart healthy   Complete by: As directed    Discharge instructions   Complete by: As directed    It was pleasure taking care of you. Please  follow-up with your cardiologist within the next 1 to 2 weeks. Your dose of amlodipine was decreased from 10-5 as your blood pressure was little low with 10 mg daily. You are being given Lasix which you can use it only if you notice some swelling or a gain in your weight 3 pounds in 1 day or 5 pounds over a week.   Increase  activity slowly   Complete by: As directed      Allergies as of 08/02/2020      Reactions   Codeine Anaphylaxis   Not Specified   Brimonidine Tartrate Itching   Fluoxetine Other (See Comments)   Reaction: Unknown   Iodine    Lodine [etodolac]    Morphine Itching   Omeprazole Nausea And Vomiting   Other Other (See Comments)   Propoxyphene Other (See Comments)   Brimonidine Itching, Other (See Comments)   Ciprofloxacin Rash   Gabapentin Rash      Medication List    STOP taking these medications   Vitamin D3 100000 UNIT/GM Powd     TAKE these medications   amLODipine 5 MG tablet Commonly known as: NORVASC Take 1 tablet (5 mg total) by mouth daily. Start taking on: August 03, 2020 What changed:   medication strength  how much to take   aspirin 81 MG EC tablet Take 81 mg by mouth daily.   cephALEXin 500 MG capsule Commonly known as: KEFLEX Take 1 capsule (500 mg total) by mouth every 12 (twelve) hours for 2 days.   Coenzyme Q10 100 MG capsule Take 100 mg by mouth daily.   famotidine 40 MG tablet Commonly known as: PEPCID Take 40 mg by mouth at bedtime.   furosemide 20 MG tablet Commonly known as: Lasix Take 1 tablet (20 mg total) by mouth daily as needed for fluid or edema.   oxyCODONE 5 MG immediate release tablet Commonly known as: Oxy IR/ROXICODONE Take 5 mg by mouth at bedtime.   thyroid 30 MG tablet Commonly known as: ARMOUR Take 30 mg by mouth daily before breakfast.       Follow-up Information    [facility] Follow up on 08/09/2020.   Specialty: Cardiology Why: at 12:00pm. Enter through the [place] entrance Contact information: [place]       Idelle Crouch, MD. Schedule an appointment as soon as possible for a visit.   Specialty: Internal Medicine Contact information: [place] [address] [phone]              Allergies  Allergen Reactions  . Codeine Anaphylaxis    Not Specified  . Brimonidine Tartrate Itching  . Fluoxetine Other (See Comments)    Reaction: Unknown  . Iodine   . Lodine [Etodolac]   . Morphine Itching  . Omeprazole Nausea And Vomiting  . Other Other (See Comments)  . Propoxyphene Other (See Comments)  . Brimonidine Itching and Other (See Comments)  . Ciprofloxacin Rash  . Gabapentin Rash    Consultations:  Cardiology  Procedures/Studies: DG Chest 2 View  Result Date: 07/31/2020 CLINICAL DATA:  Pneumonia, acute CHF EXAM: CHEST - 2 VIEW COMPARISON:  07/30/2020 FINDINGS: Patchy bilateral lower lobe opacities, likely atelectasis, pneumonia not excluded. Mild interstitial edema. Small right and small to moderate left pleural effusions. No pneumothorax. Cardiomegaly. Right subclavian pacemaker. Thoracic aortic atherosclerosis. Lower thoracic vertebral augmentation. IMPRESSION: Cardiomegaly with mild  interstitial edema. Small right and small to moderate left pleural effusions. Patchy bilateral lower lobe opacities, likely atelectasis, pneumonia not excluded. Electronically Signed   By: Julian Hy M.D.   On: 07/31/2020 17:48   DG Chest Portable 1 View  Result Date: 07/30/2020 CLINICAL DATA:  Shortness of breath. EXAM: PORTABLE CHEST 1 VIEW COMPARISON:  October 02, 2016. FINDINGS: Stable cardiomediastinal silhouette. No pneumothorax or pleural effusion is noted. Right-sided pacemaker is unchanged in position. Increased interstitial opacities are noted throughout both lungs concerning for pneumonia or edema. Bony thorax is unremarkable. IMPRESSION: Increased bilateral interstitial opacities are noted concerning for pneumonia or edema. Aortic Atherosclerosis (ICD10-I70.0). Electronically Signed   By: Marijo Conception M.D.   On: 07/30/2020 08:49   ECHOCARDIOGRAM COMPLETE  Result Date: 07/30/2020    ECHOCARDIOGRAM  REPORT   Patient Name:   AANIYAH STROHM Date of Exam: 07/30/2020 Medical Rec #:  [id number]     Height:       64.0 in Accession #:    [id number]    Weight:       112.0 lb Date of Birth:  05-12-1929      BSA:          1.529 m Patient Age:    86 years      BP:           119/60 mmHg Patient Gender: F             HR:           56 bpm. Exam Location:  [facility] Procedure: 2D Echo, Cardiac Doppler and Color Doppler Indications:     CHF- acute diastolic 182.99  History:         Patient has no prior history of Echocardiogram examinations.                  Risk Factors:Hypertension. Anemia.  Sonographer:     Sherrie Sport RDCS (AE) Referring Phys:  [place] Diagnosing Phys: Ida Rogue MD IMPRESSIONS  1. Left ventricular ejection fraction, by estimation, is 55 to 60%. The left ventricle has normal function. The left ventricle has no regional wall motion abnormalities. Left ventricular diastolic parameters are consistent with Grade I diastolic dysfunction (impaired relaxation).  2. Right ventricular systolic function is normal. The right ventricular size is normal. There is moderately elevated pulmonary artery systolic pressure. The estimated right ventricular systolic pressure is 37.1 mmHg.  3. Moderate to severe mitral valve regurgitation.  4. Tricuspid valve regurgitation is moderate.  5. Mild aortic valve stenosis.  6. Left atrial size was mildly dilated.  7. Left pleural effusion. FINDINGS  Left Ventricle: Left ventricular ejection fraction, by estimation, is 55 to 60%. The left ventricle has normal function. The left ventricle has no regional wall motion abnormalities. The left ventricular internal cavity size was normal in size. There is  no left ventricular hypertrophy. Left ventricular diastolic parameters are consistent with Grade I diastolic dysfunction (impaired relaxation). Right Ventricle: The right ventricular size is normal. No increase in right ventricular wall thickness. Right ventricular systolic  function is normal. There is moderately elevated pulmonary artery systolic pressure. The tricuspid regurgitant velocity is 3.41 m/s, and with an assumed right atrial pressure of 5 mmHg, the estimated right ventricular systolic pressure is 69.6 mmHg. Left Atrium: Left atrial size was mildly dilated. Right Atrium: Right atrial size was normal in size. Pericardium: There is no evidence of pericardial effusion. Mitral Valve: The mitral valve is normal in structure.  Moderate to severe mitral valve regurgitation. No evidence of mitral valve stenosis. Tricuspid Valve: The tricuspid valve is normal in structure. Tricuspid valve regurgitation is moderate . No evidence of tricuspid stenosis. Aortic Valve: The aortic valve is normal in structure. Aortic valve regurgitation is not visualized. Mild aortic stenosis is present. Aortic valve mean gradient measures 9.3 mmHg. Aortic valve peak gradient measures 17.0 mmHg. Aortic valve area, by VTI measures 1.21 cm. Pulmonic Valve: The pulmonic valve was normal in structure. Pulmonic valve regurgitation is not visualized. No evidence of pulmonic stenosis. Aorta: The aortic root is normal in size and structure. Venous: The inferior vena cava is normal in size with greater than 50% respiratory variability, suggesting right atrial pressure of 3 mmHg. IAS/Shunts: No atrial level shunt detected by color flow Doppler. Additional Comments: A pacer wire is visualized.  LEFT VENTRICLE PLAX 2D LVIDd:         3.88 cm  Diastology LVIDs:         2.49 cm  LV e' medial:  4.90 cm/s LV PW:         1.08 cm  LV e' lateral: 5.11 cm/s LV IVS:        1.06 cm LVOT diam:     2.00 cm LV SV:         46 LV SV Index:   30 LVOT Area:     3.14 cm  RIGHT VENTRICLE RV Basal diam:  3.32 cm RV S prime:     15.10 cm/s TAPSE (M-mode): 2.7 cm LEFT ATRIUM             Index       RIGHT ATRIUM           Index LA diam:        3.90 cm 2.55 cm/m  RA Area:     16.70 cm LA Vol (A2C):   64.4 ml 42.12 ml/m RA Volume:   42.70  ml  27.93 ml/m LA Vol (A4C):   59.9 ml 39.17 ml/m LA Biplane Vol: 63.4 ml 41.46 ml/m  AORTIC VALVE                    PULMONIC VALVE AV Area (Vmax):    1.10 cm     PV Vmax:        0.68 m/s AV Area (Vmean):   1.15 cm     PV Peak grad:   1.8 mmHg AV Area (VTI):     1.21 cm     RVOT Peak grad: 2 mmHg AV Vmax:           206.33 cm/s AV Vmean:          137.667 cm/s AV VTI:            0.384 m AV Peak Grad:      17.0 mmHg AV Mean Grad:      9.3 mmHg LVOT Vmax:         72.40 cm/s LVOT Vmean:        50.300 cm/s LVOT VTI:          0.148 m LVOT/AV VTI ratio: 0.39  AORTA Ao Root diam: 2.40 cm TRICUSPID VALVE TR Peak grad:   46.5 mmHg TR Vmax:        341.00 cm/s  SHUNTS Systemic VTI:  0.15 m Systemic Diam: 2.00 cm Ida Rogue MD Electronically signed by Ida Rogue MD Signature Date/Time: 07/30/2020/3:08:09 PM    Final      Subjective:  Patient was feeling better when seen today.  No new complaint.  Denies any more shortness of breath.  Wants to go home.  Discharge Exam: Vitals:   08/02/20 0824 08/02/20 1122  BP: (!) 101/48 (!) 114/57  Pulse: 78 79  Resp: 17 16  Temp: 98.6 F (37 C) 98.2 F (36.8 C)  SpO2: 95% 97%   Vitals:   08/02/20 0359 08/02/20 0401 08/02/20 0824 08/02/20 1122  BP: (!) 130/58  (!) 101/48 (!) 114/57  Pulse: 96  78 79  Resp: 17  17 16   Temp: 97.8 F (36.6 C)  98.6 F (37 C) 98.2 F (36.8 C)  TempSrc: Oral     SpO2: 94%  95% 97%  Weight:  48.1 kg    Height:        General: Pt is alert, awake, not in acute distress Cardiovascular: RRR, S1/S2 +, no rubs, no gallops Respiratory: CTA bilaterally, no wheezing, no rhonchi Abdominal: Soft, NT, ND, bowel sounds + Extremities: no edema, no cyanosis   The results of significant diagnostics from this hospitalization (including imaging, microbiology, ancillary and laboratory) are listed below for reference.    Microbiology: Recent Results (from the past 240 hour(s))  Blood Culture (routine x 2)     Status: None  (Preliminary result)   Collection Time: 07/30/20  8:35 AM   Specimen: BLOOD  Result Value Ref Range Status   Specimen Description BLOOD LEFT ARM  Final   Special Requests   Final    BOTTLES DRAWN AEROBIC AND ANAEROBIC Blood Culture adequate volume   Culture   Final    NO GROWTH 3 DAYS Performed at [facility], [address] [place], [place] [address]    Report Status PENDING  Incomplete  Blood Culture (routine x 2)     Status: Abnormal   Collection Time: 07/30/20  8:35 AM   Specimen: BLOOD  Result Value Ref Range Status   Specimen Description   Final    BLOOD RIGHT ARM Performed at [facility], [address], [place], [place] [address]    Special Requests   Final    BOTTLES DRAWN AEROBIC AND ANAEROBIC Blood Culture adequate volume Performed at [facility], [address] [place], [place] [address]    Culture  Setup Time   Final    GRAM POSITIVE COCCI AEROBIC BOTTLE ONLY CRITICAL RESULT CALLED TO, READ BACK BY AND VERIFIED WITH: [place] AT [place] ON 07/31/20 SNG    Culture (A)  Final    STAPHYLOCOCCUS EPIDERMIDIS THE SIGNIFICANCE OF ISOLATING THIS ORGANISM FROM A SINGLE SET OF BLOOD CULTURES WHEN MULTIPLE SETS ARE DRAWN IS UNCERTAIN. PLEASE NOTIFY THE MICROBIOLOGY DEPARTMENT WITHIN ONE WEEK IF SPECIATION AND SENSITIVITIES ARE REQUIRED. Performed at [facility], [place] [address] [place], [place] [address]    Report Status 08/02/2020 FINAL  Final  Urine culture     Status: Abnormal   Collection Time: 07/30/20  8:35 AM   Specimen: Urine, Catheterized  Result Value Ref Range Status   Specimen Description   Final    URINE, CATHETERIZED Performed at [facility], [address], [place], [place] [address]    Special Requests   Final    NONE Performed at [facility], [address] [place], [place] [address]    Culture >=100,000 COLONIES/mL ESCHERICHIA COLI (A)  Final   Report Status 08/01/2020 FINAL  Final    Organism ID, Bacteria ESCHERICHIA COLI (A)  Final  Susceptibility   Escherichia coli - MIC*    AMPICILLIN 4 SENSITIVE Sensitive     CEFAZOLIN <=4 SENSITIVE Sensitive     CEFTRIAXONE <=0.25 SENSITIVE Sensitive     CIPROFLOXACIN <=0.25 SENSITIVE Sensitive     GENTAMICIN <=1 SENSITIVE Sensitive     IMIPENEM <=0.25 SENSITIVE Sensitive     NITROFURANTOIN <=16 SENSITIVE Sensitive     TRIMETH/SULFA <=20 SENSITIVE Sensitive     AMPICILLIN/SULBACTAM <=2 SENSITIVE Sensitive     PIP/TAZO <=4 SENSITIVE Sensitive     * >=100,000 COLONIES/mL ESCHERICHIA COLI  Blood Culture ID Panel (Reflexed)     Status: Abnormal   Collection Time: 07/30/20  8:35 AM  Result Value Ref Range Status   Enterococcus faecalis NOT DETECTED NOT DETECTED Final   Enterococcus Faecium NOT DETECTED NOT DETECTED Final   Listeria monocytogenes NOT DETECTED NOT DETECTED Final   Staphylococcus species DETECTED (A) NOT DETECTED Final    Comment: CRITICAL RESULT CALLED TO, READ BACK BY AND VERIFIED WITH: SCOTT HALL AT 0612 ON 07/31/20 SNG    Staphylococcus aureus (BCID) NOT DETECTED NOT DETECTED Final   Staphylococcus epidermidis DETECTED (A) NOT DETECTED Final    Comment: Methicillin (oxacillin) resistant coagulase negative staphylococcus. Possible blood culture contaminant (unless isolated from more than one blood culture draw or clinical case suggests pathogenicity). No antibiotic treatment is indicated for blood  culture contaminants. CRITICAL RESULT CALLED TO, READ BACK BY AND VERIFIED WITH: SCOTT HALL AT 0612 ON 07/31/20 SNG    Staphylococcus lugdunensis NOT DETECTED NOT DETECTED Final   Streptococcus species NOT DETECTED NOT DETECTED Final   Streptococcus agalactiae NOT DETECTED NOT DETECTED Final   Streptococcus pneumoniae NOT DETECTED NOT DETECTED Final   Streptococcus pyogenes NOT DETECTED NOT DETECTED Final   A.calcoaceticus-baumannii NOT DETECTED NOT DETECTED Final   Bacteroides fragilis NOT DETECTED NOT DETECTED  Final   Enterobacterales NOT DETECTED NOT DETECTED Final   Enterobacter cloacae complex NOT DETECTED NOT DETECTED Final   Escherichia coli NOT DETECTED NOT DETECTED Final   Klebsiella aerogenes NOT DETECTED NOT DETECTED Final   Klebsiella oxytoca NOT DETECTED NOT DETECTED Final   Klebsiella pneumoniae NOT DETECTED NOT DETECTED Final   Proteus species NOT DETECTED NOT DETECTED Final   Salmonella species NOT DETECTED NOT DETECTED Final   Serratia marcescens NOT DETECTED NOT DETECTED Final   Haemophilus influenzae NOT DETECTED NOT DETECTED Final   Neisseria meningitidis NOT DETECTED NOT DETECTED Final   Pseudomonas aeruginosa NOT DETECTED NOT DETECTED Final   Stenotrophomonas maltophilia NOT DETECTED NOT DETECTED Final   Candida albicans NOT DETECTED NOT DETECTED Final   Candida auris NOT DETECTED NOT DETECTED Final   Candida glabrata NOT DETECTED NOT DETECTED Final   Candida krusei NOT DETECTED NOT DETECTED Final   Candida parapsilosis NOT DETECTED NOT DETECTED Final   Candida tropicalis NOT DETECTED NOT DETECTED Final   Cryptococcus neoformans/gattii NOT DETECTED NOT DETECTED Final   Methicillin resistance mecA/C DETECTED (A) NOT DETECTED Final    Comment: CRITICAL RESULT CALLED TO, READ BACK BY AND VERIFIED WITH: SCOTT HALL AT 0612 ON 07/31/20 SNG Performed at [facility], [address] [place], [place] [address]   Respiratory Panel by RT PCR (Flu A&B, Covid) - Nasopharyngeal Swab     Status: None   Collection Time: 07/30/20  8:36 AM   Specimen: Nasopharyngeal Swab  Result Value Ref Range Status   SARS Coronavirus 2 by RT PCR NEGATIVE NEGATIVE Final    Comment: (NOTE) SARS-CoV-2 target nucleic acids are NOT  DETECTED.  The SARS-CoV-2 RNA is generally detectable in upper respiratoy specimens during the acute phase of infection. The lowest concentration of SARS-CoV-2 viral copies this assay can detect is 131 copies/mL. A negative result does not preclude  SARS-Cov-2 infection and should not be used as the sole basis for treatment or other patient management decisions. A negative result may occur with  improper specimen collection/handling, submission of specimen other than nasopharyngeal swab, presence of viral mutation(s) within the areas targeted by this assay, and inadequate number of viral copies (<131 copies/mL). A negative result must be combined with clinical observations, patient history, and epidemiological information. The expected result is Negative.  Fact Sheet for Patients:  PinkCheek.be  Fact Sheet for Healthcare Providers:  GravelBags.it  This test is no t yet approved or cleared by the [place] FDA and  has been authorized for detection and/or diagnosis of SARS-CoV-2 by FDA under an Emergency Use Authorization (EUA). This EUA will remain  in effect (meaning this test can be used) for the duration of the COVID-19 declaration under Section 564(b)(1) of the Act, 21 U.S.C. section 360bbb-3(b)(1), unless the authorization is terminated or revoked sooner.     Influenza A by PCR NEGATIVE NEGATIVE Final   Influenza B by PCR NEGATIVE NEGATIVE Final    Comment: (NOTE) The Xpert Xpress SARS-CoV-2/FLU/RSV assay is intended as an aid in  the diagnosis of influenza from Nasopharyngeal swab specimens and  should not be used as a sole basis for treatment. Nasal washings and  aspirates are unacceptable for Xpert Xpress SARS-CoV-2/FLU/RSV  testing.  Fact Sheet for Patients: PinkCheek.be  Fact Sheet for Healthcare Providers: GravelBags.it  This test is not yet approved or cleared by the [place] FDA and  has been authorized for detection and/or diagnosis of SARS-CoV-2 by  FDA under an Emergency Use Authorization (EUA). This EUA will remain  in effect (meaning this test can be used) for the duration of the   Covid-19 declaration under Section 564(b)(1) of the Act, 21  U.S.C. section 360bbb-3(b)(1), unless the authorization is  terminated or revoked. Performed at [facility], [address] [place], [place] [address]   Culture, blood (single) w Reflex to ID Panel     Status: None (Preliminary result)   Collection Time: 08/01/20  3:23 AM   Specimen: BLOOD LEFT HAND  Result Value Ref Range Status   Specimen Description BLOOD LEFT HAND  Final   Special Requests   Final    BOTTLES DRAWN AEROBIC AND ANAEROBIC Blood Culture adequate volume   Culture   Final    NO GROWTH 1 DAY Performed at [facility], [address] [place], [place] [address]    Report Status PENDING  Incomplete     Labs: BNP (last 3 results) Recent Labs    07/30/20 0835  BNP 403.4*   Basic Metabolic Panel: Recent Labs  Lab 07/30/20 0835 07/30/20 1148 07/31/20 0732 08/01/20 0323 08/02/20 0534  NA 137  --  135 137 139  K 4.8  --  3.6 3.5 3.8  CL 105  --  101 99 99  CO2 20*  --  24 24 29   GLUCOSE 183*  --  86 89 91  BUN 31*  --  36* 36* 32*  CREATININE 1.60*  --  1.84* 1.90* 1.64*  CALCIUM 8.9  --  9.1 9.0 9.1  MG  --  2.0  --   --   --    Liver Function Tests: Recent Labs  Lab 07/30/20 0835  AST 105*  ALT 69*  ALKPHOS 65  BILITOT 1.0  PROT 7.1  ALBUMIN 3.7   No results for input(s): LIPASE, AMYLASE in the last 168 hours. No results for input(s): AMMONIA in the last 168 hours. CBC: Recent Labs  Lab 07/30/20 0835 07/31/20 0732 08/01/20 0323 08/02/20 0534  WBC 12.8* 11.2* 10.2 9.5  NEUTROABS 10.8*  --   --   --   HGB 11.4* 9.7* 10.2* 10.9*  HCT 35.9* 28.5* 29.2* 31.3*  MCV 92.8 88.8 85.6 86.0  PLT 343 293 338 384   Cardiac Enzymes: No results for input(s): CKTOTAL, CKMB, CKMBINDEX, TROPONINI in the last 168 hours. BNP: Invalid input(s): POCBNP CBG: No results for input(s): GLUCAP in the last 168 hours. D-Dimer No results for input(s): DDIMER in the last 72  hours. Hgb A1c No results for input(s): HGBA1C in the last 72 hours. Lipid Profile No results for input(s): CHOL, HDL, LDLCALC, TRIG, CHOLHDL, LDLDIRECT in the last 72 hours. Thyroid function studies No results for input(s): TSH, T4TOTAL, T3FREE, THYROIDAB in the last 72 hours.  Invalid input(s): FREET3 Anemia work up No results for input(s): VITAMINB12, FOLATE, FERRITIN, TIBC, IRON, RETICCTPCT in the last 72 hours. Urinalysis    Component Value Date/Time   COLORURINE YELLOW (A) 07/30/2020 0835   APPEARANCEUR HAZY (A) 07/30/2020 0835   APPEARANCEUR Cloudy 12/26/2013 1622   LABSPEC 1.013 07/30/2020 0835   LABSPEC 1.011 12/26/2013 1622   PHURINE 5.0 07/30/2020 0835   GLUCOSEU 150 (A) 07/30/2020 0835   GLUCOSEU Negative 12/26/2013 1622   HGBUR NEGATIVE 07/30/2020 0835   BILIRUBINUR NEGATIVE 07/30/2020 0835   BILIRUBINUR Negative 12/26/2013 1622   KETONESUR NEGATIVE 07/30/2020 0835   PROTEINUR NEGATIVE 07/30/2020 0835   NITRITE NEGATIVE 07/30/2020 0835   LEUKOCYTESUR NEGATIVE 07/30/2020 0835   LEUKOCYTESUR 3+ 12/26/2013 1622   Sepsis Labs Invalid input(s): PROCALCITONIN,  WBC,  LACTICIDVEN Microbiology Recent Results (from the past 240 hour(s))  Blood Culture (routine x 2)     Status: None (Preliminary result)   Collection Time: 07/30/20  8:35 AM   Specimen: BLOOD  Result Value Ref Range Status   Specimen Description BLOOD LEFT ARM  Final   Special Requests   Final    BOTTLES DRAWN AEROBIC AND ANAEROBIC Blood Culture adequate volume   Culture   Final    NO GROWTH 3 DAYS Performed at [facility], [address], [place], [place] [address]    Report Status PENDING  Incomplete  Blood Culture (routine x 2)     Status: Abnormal   Collection Time: 07/30/20  8:35 AM   Specimen: BLOOD  Result Value Ref Range Status   Specimen Description   Final    BLOOD RIGHT ARM Performed at [facility], [address] [place], [place] [address]    Special Requests    Final    BOTTLES DRAWN AEROBIC AND ANAEROBIC Blood Culture adequate volume Performed at [facility], [address] [place],  [address]    Culture  Setup Time   Final    GRAM POSITIVE COCCI AEROBIC BOTTLE ONLY CRITICAL RESULT CALLED TO, READ BACK BY AND VERIFIED WITH: [place] AT [place] ON 07/31/20 SNG    Culture (A)  Final    STAPHYLOCOCCUS EPIDERMIDIS THE SIGNIFICANCE OF ISOLATING THIS ORGANISM FROM A SINGLE SET OF BLOOD CULTURES WHEN MULTIPLE SETS ARE DRAWN IS UNCERTAIN. PLEASE NOTIFY THE MICROBIOLOGY DEPARTMENT WITHIN ONE WEEK IF SPECIATION AND SENSITIVITIES ARE REQUIRED. Performed at  [facility], [place] [address], [place], [place] [address]    Report Status 08/02/2020 FINAL  Final  Urine culture     Status: Abnormal   Collection Time: 07/30/20  8:35 AM   Specimen: Urine, Catheterized  Result Value Ref Range Status   Specimen Description   Final    URINE, CATHETERIZED Performed at [facility], [address] [place], [place] [address]    Special Requests   Final    NONE Performed at [facility], [address] [place], [place] [address]    Culture >=100,000 COLONIES/mL ESCHERICHIA COLI (A)  Final   Report Status 08/01/2020 FINAL  Final   Organism ID, Bacteria ESCHERICHIA COLI (A)  Final      Susceptibility   Escherichia coli - MIC*    AMPICILLIN 4 SENSITIVE Sensitive     CEFAZOLIN <=4 SENSITIVE Sensitive     CEFTRIAXONE <=0.25 SENSITIVE Sensitive     CIPROFLOXACIN <=0.25 SENSITIVE Sensitive     GENTAMICIN <=1 SENSITIVE Sensitive     IMIPENEM <=0.25 SENSITIVE Sensitive     NITROFURANTOIN <=16 SENSITIVE Sensitive     TRIMETH/SULFA <=20 SENSITIVE Sensitive     AMPICILLIN/SULBACTAM <=2 SENSITIVE Sensitive     PIP/TAZO <=4 SENSITIVE Sensitive     * >=100,000 COLONIES/mL ESCHERICHIA COLI  Blood Culture ID Panel (Reflexed)     Status: Abnormal   Collection Time: 07/30/20  8:35 AM  Result Value Ref Range Status   Enterococcus  faecalis NOT DETECTED NOT DETECTED Final   Enterococcus Faecium NOT DETECTED NOT DETECTED Final   Listeria monocytogenes NOT DETECTED NOT DETECTED Final   Staphylococcus species DETECTED (A) NOT DETECTED Final    Comment: CRITICAL RESULT CALLED TO, READ BACK BY AND VERIFIED WITH: SCOTT HALL AT 0612 ON 07/31/20 SNG    Staphylococcus aureus (BCID) NOT DETECTED NOT DETECTED Final   Staphylococcus epidermidis DETECTED (A) NOT DETECTED Final    Comment: Methicillin (oxacillin) resistant coagulase negative staphylococcus. Possible blood culture contaminant (unless isolated from more than one blood culture draw or clinical case suggests pathogenicity). No antibiotic treatment is indicated for blood  culture contaminants. CRITICAL RESULT CALLED TO, READ BACK BY AND VERIFIED WITH: SCOTT HALL AT 0612 ON 07/31/20 SNG    Staphylococcus lugdunensis NOT DETECTED NOT DETECTED Final   Streptococcus species NOT DETECTED NOT DETECTED Final   Streptococcus agalactiae NOT DETECTED NOT DETECTED Final   Streptococcus pneumoniae NOT DETECTED NOT DETECTED Final   Streptococcus pyogenes NOT DETECTED NOT DETECTED Final   A.calcoaceticus-baumannii NOT DETECTED NOT DETECTED Final   Bacteroides fragilis NOT DETECTED NOT DETECTED Final   Enterobacterales NOT DETECTED NOT DETECTED Final   Enterobacter cloacae complex NOT DETECTED NOT DETECTED Final   Escherichia coli NOT DETECTED NOT DETECTED Final   Klebsiella aerogenes NOT DETECTED NOT DETECTED Final   Klebsiella oxytoca NOT DETECTED NOT DETECTED Final   Klebsiella pneumoniae NOT DETECTED NOT DETECTED Final   Proteus species NOT DETECTED NOT DETECTED Final   Salmonella species NOT DETECTED NOT DETECTED Final   Serratia marcescens NOT DETECTED NOT DETECTED Final   Haemophilus influenzae NOT DETECTED NOT DETECTED Final   Neisseria meningitidis NOT DETECTED NOT DETECTED Final   Pseudomonas aeruginosa NOT DETECTED NOT DETECTED Final   Stenotrophomonas maltophilia NOT  DETECTED NOT DETECTED Final   Candida albicans NOT DETECTED NOT DETECTED Final   Candida auris NOT DETECTED NOT DETECTED Final   Candida glabrata NOT DETECTED NOT DETECTED Final   Candida krusei NOT DETECTED NOT DETECTED  Final   Candida parapsilosis NOT DETECTED NOT DETECTED Final   Candida tropicalis NOT DETECTED NOT DETECTED Final   Cryptococcus neoformans/gattii NOT DETECTED NOT DETECTED Final   Methicillin resistance mecA/C DETECTED (A) NOT DETECTED Final    Comment: CRITICAL RESULT CALLED TO, READ BACK BY AND VERIFIED WITH: SCOTT HALL AT 0612 ON 07/31/20 SNG Performed at [facility], [address], [place], [place] [address]   Respiratory Panel by RT PCR (Flu A&B, Covid) - Nasopharyngeal Swab     Status: None   Collection Time: 07/30/20  8:36 AM   Specimen: Nasopharyngeal Swab  Result Value Ref Range Status   SARS Coronavirus 2 by RT PCR NEGATIVE NEGATIVE Final    Comment: (NOTE) SARS-CoV-2 target nucleic acids are NOT DETECTED.  The SARS-CoV-2 RNA is generally detectable in upper respiratoy specimens during the acute phase of infection. The lowest concentration of SARS-CoV-2 viral copies this assay can detect is 131 copies/mL. A negative result does not preclude SARS-Cov-2 infection and should not be used as the sole basis for treatment or other patient management decisions. A negative result may occur with  improper specimen collection/handling, submission of specimen other than nasopharyngeal swab, presence of viral mutation(s) within the areas targeted by this assay, and inadequate number of viral copies (<131 copies/mL). A negative result must be combined with clinical observations, patient history, and epidemiological information. The expected result is Negative.  Fact Sheet for Patients:  PinkCheek.be  Fact Sheet for Healthcare Providers:  GravelBags.it  This test is no t yet approved or cleared by  the [place] FDA and  has been authorized for detection and/or diagnosis of SARS-CoV-2 by FDA under an Emergency Use Authorization (EUA). This EUA will remain  in effect (meaning this test can be used) for the duration of the COVID-19 declaration under Section 564(b)(1) of the Act, 21 U.S.C. section 360bbb-3(b)(1), unless the authorization is terminated or revoked sooner.     Influenza A by PCR NEGATIVE NEGATIVE Final   Influenza B by PCR NEGATIVE NEGATIVE Final    Comment: (NOTE) The Xpert Xpress SARS-CoV-2/FLU/RSV assay is intended as an aid in  the diagnosis of influenza from Nasopharyngeal swab specimens and  should not be used as a sole basis for treatment. Nasal washings and  aspirates are unacceptable for Xpert Xpress SARS-CoV-2/FLU/RSV  testing.  Fact Sheet for Patients: PinkCheek.be  Fact Sheet for Healthcare Providers: GravelBags.it  This test is not yet approved or cleared by the [place] FDA and  has been authorized for detection and/or diagnosis of SARS-CoV-2 by  FDA under an Emergency Use Authorization (EUA). This EUA will remain  in effect (meaning this test can be used) for the duration of the  Covid-19 declaration under Section 564(b)(1) of the Act, 21  U.S.C. section 360bbb-3(b)(1), unless the authorization is  terminated or revoked. Performed at [facility], [address] [place], [place] [address]   Culture, blood (single) w Reflex to ID Panel     Status: None (Preliminary result)   Collection Time: 08/01/20  3:23 AM   Specimen: BLOOD LEFT HAND  Result Value Ref Range Status   Specimen Description BLOOD LEFT HAND  Final   Special Requests   Final    BOTTLES DRAWN AEROBIC AND ANAEROBIC Blood Culture adequate volume   Culture   Final    NO GROWTH 1 DAY Performed at [facility], [address], [place], [place] [address]    Report Status PENDING  Incomplete  Time coordinating discharge: Over 30 minutes  SIGNED:  Lorella Nimrod, MD  [facility] 08/02/2020, 1:48 PM  If 7PM-7AM, please contact night-coverage [email or web address]  This record has been created using Systems analyst. Errors have been sought and corrected,but may not always be located. Such creation errors do not reflect on the standard of care.

## 2020-08-02 NOTE — TOC Transition Note (Signed)
Transition of Care [facility]) - CM/SW Discharge Note   Patient Details  Name: Courtney Manning MRN: [id number] Date of Birth: 28-Jul-1929  Transition of Care [facility]) CM/SW Contact:  Victorino Dike, RN Phone Number: 08/02/2020, 2:41 PM   Clinical Narrative:    Patient to discharge home today.  [place] services to be provided by [facility] (PT/OT/RN/AIDE). No further TOC needs at this time, please re-consult for new needs.    Final next level of care: [place] Barriers to Discharge: Continued Medical Work up   Patient Goals and CMS Choice Patient states their goals for this hospitalization and ongoing recovery are:: Patietn woudl like to return home as soon as possible. CMS Medicare.gov Compare Post Acute Care list provided to:: Patient Represenative (must comment) (Daughter Estate manager/land agent)    Discharge Placement                       Discharge Plan and Services In-house Referral: Clinical Social Work                        HH Arranged: Therapist, sports, PT, OT, Nurse's Aide [place] Agency: [place] ([place]) Date [place]: 08/02/20 Time [place]: 1440 Representative spoke with at [place]: [place] ([place]) Interventions     Readmission Risk Interventions No flowsheet data found.

## 2020-08-02 NOTE — Plan of Care (Signed)
Pt alert but forgetful. Falls precautions in place. VSS. Pt has no acute complaints or changes. Will continue to monitor. Problem: Education: Goal: Knowledge of General Education information will improve Description: Including pain rating scale, medication(s)/side effects and non-pharmacologic comfort measures Outcome: Progressing   Problem: Activity: Goal: Risk for activity intolerance will decrease Outcome: Progressing   Problem: Pain Managment: Goal: General experience of comfort will improve Outcome: Progressing   Problem: Safety: Goal: Ability to remain free from injury will improve Outcome: Progressing   Problem: Skin Integrity: Goal: Risk for impaired skin integrity will decrease Outcome: Progressing   Problem: Cardiac: Goal: Ability to achieve and maintain adequate cardiopulmonary perfusion will improve Outcome: Progressing

## 2020-08-02 NOTE — Care Management Important Message (Signed)
Important Message  Patient Details  Name: Courtney Manning MRN: [id number] Date of Birth: [birth year]   Medicare Important Message Given:  Yes     Dannette Barbara 08/02/2020, 1:06 PM

## 2020-08-04 LAB — CULTURE, BLOOD (ROUTINE X 2)
Culture: NO GROWTH
Special Requests: ADEQUATE

## 2020-08-05 NOTE — Progress Notes (Signed)
Patient ID: Courtney Manning, female    DOB: [birth year], 86 y.o.   MRN: [id number]  HPI  [name] is a 86 y/o female with a history of uterine cancer, HTN, CKD, thyroid disease, spinal stenosis, GERD, anemia and chronic heart failure.   Echo report from 07/30/20 reviewed and showed an EF of 55-60% along with moderately elevated PA pressure (51.5 mmHg), moderate TR, moderate/severe MR and mild LAE.   Admitted 07/30/20 due to shortness of breath along with AMS. Placed on CPAP due to saturations in the 60's. Cardiology consult obtained. Initially given IV lasix with transition to oral diuretics. Transitioned to bipap and then to room air. Discharged after 3 days.   She presents today for her initial visit with a chief complaint of minimal fatigue upon moderate exertion. She describes this as chronic in nature having been present for several years. She has associated difficulty sleeping and chronic back pain along with this. She denies any dizziness, abdominal distention, palpitations, pedal edema, chest pain, shortness of breath, cough or weight gain.   Past Medical History:  Diagnosis Date  . Anemia   . Cervical cancer ([place])    Uterine Cancer  . CHF (congestive heart failure) ([place])   . Chronic kidney disease   . Constipation   . GERD (gastroesophageal reflux disease)   . Glaucoma   . Hypertension   . Hypothyroidism   . Insomnia   . Spinal stenosis   . Thyroid disease    Past Surgical History:  Procedure Laterality Date  . ABDOMINAL HYSTERECTOMY    . BACK SURGERY    . COLONOSCOPY WITH PROPOFOL N/A 09/07/2016   Procedure: COLONOSCOPY WITH PROPOFOL;  Surgeon: Lollie Sails, MD;  Location: [facility] ENDOSCOPY;  Service: Endoscopy;  Laterality: N/A;  . ESOPHAGOGASTRODUODENOSCOPY (EGD) WITH PROPOFOL N/A 09/07/2016   Procedure: ESOPHAGOGASTRODUODENOSCOPY (EGD) WITH PROPOFOL;  Surgeon: Lollie Sails, MD;  Location: [facility] ENDOSCOPY;  Service: Endoscopy;  Laterality: N/A;  . HIP SURGERY    .  JOINT REPLACEMENT     Hip Replacement  . KYPHOPLASTY N/A 06/17/2015   Procedure: KYPHOPLASTY;  Surgeon: Hessie Knows, MD;  Location: [facility] ORS;  Service: Orthopedics;  Laterality: N/A;  . PACEMAKER INSERTION    . SPINE SURGERY     Family History  Problem Relation Age of Onset  . Diabetes Mellitus II Mother   . Diabetes Mellitus II Father   . Hypertension Father    Social History   Tobacco Use  . Smoking status: Never Smoker  . Smokeless tobacco: Never Used  Substance Use Topics  . Alcohol use: No   Allergies  Allergen Reactions  . Codeine Anaphylaxis    Not Specified  . Brimonidine Tartrate Itching  . Fluoxetine Other (See Comments)    Reaction: Unknown  . Iodine   . Lodine [Etodolac]   . Morphine Itching  . Omeprazole Nausea And Vomiting  . Other Other (See Comments)  . Propoxyphene Other (See Comments)  . Brimonidine Itching and Other (See Comments)  . Ciprofloxacin Rash  . Gabapentin Rash   Prior to Admission medications   Medication Sig Start Date End Date Taking? Authorizing Provider  amLODipine (NORVASC) 5 MG tablet Take 1 tablet (5 mg total) by mouth daily. 08/03/20  Yes Lorella Nimrod, MD  aspirin 81 MG EC tablet Take 81 mg by mouth daily.    Yes [provider]  famotidine (PEPCID) 40 MG tablet Take 40 mg by mouth at bedtime. 05/04/20  Yes [provider]  furosemide (LASIX) 20 MG tablet Take 1 tablet (20 mg total) by mouth daily as needed for fluid or edema. 08/02/20 08/02/21 Yes Lorella Nimrod, MD  oxyCODONE (OXY IR/ROXICODONE) 5 MG immediate release tablet Take 5 mg by mouth at bedtime.  08/01/15  Yes [provider]  thyroid (ARMOUR) 30 MG tablet Take 30 mg by mouth daily before breakfast.  03/07/17  Yes [provider]    Review of Systems  Constitutional: Positive for fatigue. Negative for appetite change.  HENT: Negative for congestion, postnasal drip and sore throat.   Eyes: Negative.   Respiratory: Negative for cough and  shortness of breath.   Cardiovascular: Negative for chest pain, palpitations and leg swelling.  Gastrointestinal: Negative for abdominal distention and abdominal pain.  Endocrine: Negative.   Genitourinary: Negative.   Musculoskeletal: Positive for back pain. Negative for neck pain.  Skin: Negative.   Allergic/Immunologic: Negative.   Neurological: Negative for dizziness and light-headedness.  Hematological: Negative for adenopathy. Does not bruise/bleed easily.  Psychiatric/Behavioral: Positive for sleep disturbance (chronic difficulty sleeping). Negative for dysphoric mood. The patient is not nervous/anxious.    Vitals:   08/09/20 1205  BP: 128/78  Pulse: 80  Resp: 18  SpO2: 99%  Weight: 107 lb 6 oz (48.7 kg)  Height: 5\' 4"  (1.626 m)   Wt Readings from Last 3 Encounters:  08/09/20 107 lb 6 oz (48.7 kg)  08/02/20 106 lb (48.1 kg)  07/24/17 112 lb (50.8 kg)   Lab Results  Component Value Date   CREATININE 1.64 (H) 08/02/2020   CREATININE 1.90 (H) 08/01/2020   CREATININE 1.84 (H) 07/31/2020    Physical Exam Vitals and nursing note reviewed.  Constitutional:      Appearance: Normal appearance.  HENT:     Head: Normocephalic and atraumatic.  Cardiovascular:     Rate and Rhythm: Normal rate and regular rhythm.  Pulmonary:     Effort: Pulmonary effort is normal. No respiratory distress.     Breath sounds: No wheezing or rales.  Abdominal:     General: There is no distension.     Palpations: Abdomen is soft.  Musculoskeletal:        General: No tenderness.     Cervical back: Normal range of motion and neck supple.     Right lower leg: No edema.     Left lower leg: No edema.  Skin:    General: Skin is warm and dry.  Neurological:     General: No focal deficit present.     Mental Status: She is alert and oriented to person, place, and time.  Psychiatric:        Mood and Affect: Mood normal.        Behavior: Behavior normal.        Thought Content: Thought content  normal.    Assessment & Plan:  1: Chronic heart failure with preserved ejection fraction along with structural changes (LAE)- - NYHA class II - euvolemic today - weighing daily and says that her weight has been stable; instructed to call for an overnight weight gain of > 2 pounds or a weekly weight gain of >5 pounds - not adding salt to her food; low sodium cookbook and dietary information was given to her about this - BNP 07/30/20 was 464.3 - reports receiving both covid vaccines  2: HTN- - BP looks good today - saw PCP ([place]) 02/17/20 - BMP 08/02/20 reviewed and showed sodium 139, potassium 3.8, creatinine 1.64 and GFR 27  3: CKD- - saw nephrology (K[place]uru) 05/03/20   Medication list was reviewed.   Due to HF stability, patient opts to not make a return appointment at this time. Advised patient and her daughter that they could call back at anytime to make another appointment. Both were comfortable with this plan.

## 2020-08-06 LAB — CULTURE, BLOOD (SINGLE)
Culture: NO GROWTH
Special Requests: ADEQUATE

## 2020-08-09 ENCOUNTER — Ambulatory Visit: Payer: Medicare Other | Admitting: Family

## 2020-08-09 ENCOUNTER — Encounter: Payer: Self-pay | Admitting: Family

## 2020-08-09 ENCOUNTER — Other Ambulatory Visit: Payer: Self-pay

## 2020-08-09 VITALS — BP 128/78 | HR 80 | Resp 18 | Ht 64.0 in | Wt 107.4 lb

## 2020-08-09 DIAGNOSIS — M4800 Spinal stenosis, site unspecified: Secondary | ICD-10-CM | POA: Insufficient documentation

## 2020-08-09 DIAGNOSIS — Z950 Presence of cardiac pacemaker: Secondary | ICD-10-CM | POA: Insufficient documentation

## 2020-08-09 DIAGNOSIS — Z79890 Hormone replacement therapy (postmenopausal): Secondary | ICD-10-CM | POA: Insufficient documentation

## 2020-08-09 DIAGNOSIS — Z888 Allergy status to other drugs, medicaments and biological substances status: Secondary | ICD-10-CM | POA: Insufficient documentation

## 2020-08-09 DIAGNOSIS — G8929 Other chronic pain: Secondary | ICD-10-CM | POA: Insufficient documentation

## 2020-08-09 DIAGNOSIS — Z8249 Family history of ischemic heart disease and other diseases of the circulatory system: Secondary | ICD-10-CM | POA: Insufficient documentation

## 2020-08-09 DIAGNOSIS — Z7982 Long term (current) use of aspirin: Secondary | ICD-10-CM | POA: Insufficient documentation

## 2020-08-09 DIAGNOSIS — I5032 Chronic diastolic (congestive) heart failure: Secondary | ICD-10-CM | POA: Insufficient documentation

## 2020-08-09 DIAGNOSIS — K219 Gastro-esophageal reflux disease without esophagitis: Secondary | ICD-10-CM | POA: Insufficient documentation

## 2020-08-09 DIAGNOSIS — I10 Essential (primary) hypertension: Secondary | ICD-10-CM

## 2020-08-09 DIAGNOSIS — I130 Hypertensive heart and chronic kidney disease with heart failure and stage 1 through stage 4 chronic kidney disease, or unspecified chronic kidney disease: Secondary | ICD-10-CM | POA: Insufficient documentation

## 2020-08-09 DIAGNOSIS — Z8542 Personal history of malignant neoplasm of other parts of uterus: Secondary | ICD-10-CM | POA: Insufficient documentation

## 2020-08-09 DIAGNOSIS — A419 Sepsis, unspecified organism: Secondary | ICD-10-CM | POA: Diagnosis not present

## 2020-08-09 DIAGNOSIS — N189 Chronic kidney disease, unspecified: Secondary | ICD-10-CM | POA: Insufficient documentation

## 2020-08-09 DIAGNOSIS — I129 Hypertensive chronic kidney disease with stage 1 through stage 4 chronic kidney disease, or unspecified chronic kidney disease: Secondary | ICD-10-CM

## 2020-08-09 DIAGNOSIS — Z79891 Long term (current) use of opiate analgesic: Secondary | ICD-10-CM | POA: Insufficient documentation

## 2020-08-09 DIAGNOSIS — E039 Hypothyroidism, unspecified: Secondary | ICD-10-CM | POA: Insufficient documentation

## 2020-08-09 DIAGNOSIS — Z881 Allergy status to other antibiotic agents status: Secondary | ICD-10-CM | POA: Insufficient documentation

## 2020-08-09 DIAGNOSIS — J9600 Acute respiratory failure, unspecified whether with hypoxia or hypercapnia: Secondary | ICD-10-CM | POA: Diagnosis not present

## 2020-08-09 DIAGNOSIS — Z885 Allergy status to narcotic agent status: Secondary | ICD-10-CM | POA: Insufficient documentation

## 2020-08-09 DIAGNOSIS — Z79899 Other long term (current) drug therapy: Secondary | ICD-10-CM | POA: Insufficient documentation

## 2020-08-09 DIAGNOSIS — N184 Chronic kidney disease, stage 4 (severe): Secondary | ICD-10-CM

## 2020-08-09 NOTE — Patient Instructions (Addendum)
Continue weighing daily and call for an overnight weight gain of > 2 pounds or a weekly weight gain of >5 pounds.   Call us in the future if you'd like to schedule another appointment 

## 2020-08-12 ENCOUNTER — Inpatient Hospital Stay: Payer: Medicare Other

## 2020-08-12 ENCOUNTER — Other Ambulatory Visit: Payer: Self-pay

## 2020-08-12 ENCOUNTER — Inpatient Hospital Stay
Admission: EM | Admit: 2020-08-12 | Discharge: 2020-08-16 | DRG: 871 | Disposition: A | Discharge status: Home / Self Care | Payer: Medicare Other | Attending: Hospitalist | Admitting: Hospitalist

## 2020-08-12 ENCOUNTER — Emergency Department: Payer: Medicare Other

## 2020-08-12 ENCOUNTER — Encounter: Payer: Self-pay | Admitting: Emergency Medicine

## 2020-08-12 DIAGNOSIS — I5033 Acute on chronic diastolic (congestive) heart failure: Secondary | ICD-10-CM | POA: Diagnosis present

## 2020-08-12 DIAGNOSIS — Z20822 Contact with and (suspected) exposure to covid-19: Secondary | ICD-10-CM | POA: Diagnosis present

## 2020-08-12 DIAGNOSIS — J159 Unspecified bacterial pneumonia: Secondary | ICD-10-CM | POA: Diagnosis present

## 2020-08-12 DIAGNOSIS — I130 Hypertensive heart and chronic kidney disease with heart failure and stage 1 through stage 4 chronic kidney disease, or unspecified chronic kidney disease: Secondary | ICD-10-CM | POA: Diagnosis present

## 2020-08-12 DIAGNOSIS — F05 Delirium due to known physiological condition: Secondary | ICD-10-CM | POA: Diagnosis not present

## 2020-08-12 DIAGNOSIS — G8929 Other chronic pain: Secondary | ICD-10-CM | POA: Diagnosis present

## 2020-08-12 DIAGNOSIS — Z881 Allergy status to other antibiotic agents status: Secondary | ICD-10-CM

## 2020-08-12 DIAGNOSIS — Z90710 Acquired absence of both cervix and uterus: Secondary | ICD-10-CM

## 2020-08-12 DIAGNOSIS — Z79891 Long term (current) use of opiate analgesic: Secondary | ICD-10-CM

## 2020-08-12 DIAGNOSIS — H409 Unspecified glaucoma: Secondary | ICD-10-CM | POA: Diagnosis present

## 2020-08-12 DIAGNOSIS — Z950 Presence of cardiac pacemaker: Secondary | ICD-10-CM

## 2020-08-12 DIAGNOSIS — D631 Anemia in chronic kidney disease: Secondary | ICD-10-CM | POA: Diagnosis present

## 2020-08-12 DIAGNOSIS — Z79899 Other long term (current) drug therapy: Secondary | ICD-10-CM

## 2020-08-12 DIAGNOSIS — Z8249 Family history of ischemic heart disease and other diseases of the circulatory system: Secondary | ICD-10-CM

## 2020-08-12 DIAGNOSIS — Z79890 Hormone replacement therapy (postmenopausal): Secondary | ICD-10-CM | POA: Diagnosis not present

## 2020-08-12 DIAGNOSIS — J189 Pneumonia, unspecified organism: Secondary | ICD-10-CM | POA: Diagnosis present

## 2020-08-12 DIAGNOSIS — N184 Chronic kidney disease, stage 4 (severe): Secondary | ICD-10-CM | POA: Diagnosis present

## 2020-08-12 DIAGNOSIS — R680 Hypothermia, not associated with low environmental temperature: Secondary | ICD-10-CM | POA: Diagnosis present

## 2020-08-12 DIAGNOSIS — Z7982 Long term (current) use of aspirin: Secondary | ICD-10-CM

## 2020-08-12 DIAGNOSIS — J9601 Acute respiratory failure with hypoxia: Secondary | ICD-10-CM | POA: Diagnosis present

## 2020-08-12 DIAGNOSIS — J9600 Acute respiratory failure, unspecified whether with hypoxia or hypercapnia: Secondary | ICD-10-CM | POA: Diagnosis present

## 2020-08-12 DIAGNOSIS — K219 Gastro-esophageal reflux disease without esophagitis: Secondary | ICD-10-CM | POA: Diagnosis present

## 2020-08-12 DIAGNOSIS — Z888 Allergy status to other drugs, medicaments and biological substances status: Secondary | ICD-10-CM

## 2020-08-12 DIAGNOSIS — Z8542 Personal history of malignant neoplasm of other parts of uterus: Secondary | ICD-10-CM

## 2020-08-12 DIAGNOSIS — Z8541 Personal history of malignant neoplasm of cervix uteri: Secondary | ICD-10-CM

## 2020-08-12 DIAGNOSIS — A419 Sepsis, unspecified organism: Principal | ICD-10-CM | POA: Diagnosis present

## 2020-08-12 DIAGNOSIS — G4700 Insomnia, unspecified: Secondary | ICD-10-CM | POA: Diagnosis present

## 2020-08-12 DIAGNOSIS — E039 Hypothyroidism, unspecified: Secondary | ICD-10-CM | POA: Diagnosis present

## 2020-08-12 DIAGNOSIS — I129 Hypertensive chronic kidney disease with stage 1 through stage 4 chronic kidney disease, or unspecified chronic kidney disease: Secondary | ICD-10-CM | POA: Diagnosis present

## 2020-08-12 DIAGNOSIS — Z885 Allergy status to narcotic agent status: Secondary | ICD-10-CM

## 2020-08-12 DIAGNOSIS — R54 Age-related physical debility: Secondary | ICD-10-CM | POA: Diagnosis present

## 2020-08-12 LAB — CBC WITH DIFFERENTIAL/PLATELET
Abs Immature Granulocytes: 0.09 10*3/uL — ABNORMAL HIGH (ref 0.00–0.07)
Basophils Absolute: 0.1 10*3/uL (ref 0.0–0.1)
Basophils Relative: 0 %
Eosinophils Absolute: 0 10*3/uL (ref 0.0–0.5)
Eosinophils Relative: 0 %
HCT: 32.8 % — ABNORMAL LOW (ref 36.0–46.0)
Hemoglobin: 10.6 g/dL — ABNORMAL LOW (ref 12.0–15.0)
Immature Granulocytes: 1 %
Lymphocytes Relative: 4 %
Lymphs Abs: 0.8 10*3/uL (ref 0.7–4.0)
MCH: 29.6 pg (ref 26.0–34.0)
MCHC: 32.3 g/dL (ref 30.0–36.0)
MCV: 91.6 fL (ref 80.0–100.0)
Monocytes Absolute: 0.8 10*3/uL (ref 0.1–1.0)
Monocytes Relative: 4 %
Neutro Abs: 16.7 10*3/uL — ABNORMAL HIGH (ref 1.7–7.7)
Neutrophils Relative %: 91 %
Platelets: 339 10*3/uL (ref 150–400)
RBC: 3.58 MIL/uL — ABNORMAL LOW (ref 3.87–5.11)
RDW: 13.2 % (ref 11.5–15.5)
WBC: 18.3 10*3/uL — ABNORMAL HIGH (ref 4.0–10.5)
nRBC: 0 % (ref 0.0–0.2)

## 2020-08-12 LAB — RESPIRATORY PANEL BY RT PCR (FLU A&B, COVID)
Influenza A by PCR: NEGATIVE
Influenza B by PCR: NEGATIVE
SARS Coronavirus 2 by RT PCR: NEGATIVE

## 2020-08-12 LAB — HIV ANTIBODY (ROUTINE TESTING W REFLEX): HIV Screen 4th Generation wRfx: NONREACTIVE

## 2020-08-12 LAB — LACTIC ACID, PLASMA
Lactic Acid, Venous: 1.7 mmol/L (ref 0.5–1.9)
Lactic Acid, Venous: 1.1 mmol/L (ref 0.5–1.9)

## 2020-08-12 LAB — COMPREHENSIVE METABOLIC PANEL
ALT: 30 U/L (ref 0–44)
AST: 33 U/L (ref 15–41)
Albumin: 3.9 g/dL (ref 3.5–5.0)
Alkaline Phosphatase: 56 U/L (ref 38–126)
Anion gap: 12 (ref 5–15)
BUN: 37 mg/dL — ABNORMAL HIGH (ref 8–23)
CO2: 22 mmol/L (ref 22–32)
Calcium: 9 mg/dL (ref 8.9–10.3)
Chloride: 102 mmol/L (ref 98–111)
Creatinine, Ser: 1.8 mg/dL — ABNORMAL HIGH (ref 0.44–1.00)
GFR, Estimated: 25 mL/min — ABNORMAL LOW (ref >60)
Glucose, Bld: 95 mg/dL (ref 70–99)
Potassium: 4.5 mmol/L (ref 3.5–5.1)
Sodium: 136 mmol/L (ref 135–145)
Total Bilirubin: 0.7 mg/dL (ref 0.3–1.2)
Total Protein: 7 g/dL (ref 6.5–8.1)

## 2020-08-12 LAB — PROTIME-INR
INR: 1 (ref 0.8–1.2)
Prothrombin Time: 13 seconds (ref 11.4–15.2)

## 2020-08-12 LAB — PROCALCITONIN: Procalcitonin: 0.15 ng/mL

## 2020-08-12 LAB — GLUCOSE, CAPILLARY: Glucose-Capillary: 109 mg/dL — ABNORMAL HIGH (ref 70–99)

## 2020-08-12 MED ORDER — SODIUM CHLORIDE 0.9 % IV BOLUS: 500.0000 mL | Freq: Once | INTRAVENOUS | Status: DC

## 2020-08-12 MED ORDER — ENOXAPARIN SODIUM 30 MG/0.3ML ~~LOC~~ SOLN
30.0000 mg | SUBCUTANEOUS | Status: DC
  Administered 2020-08-12 – 2020-08-15 (×4): 30 mg via SUBCUTANEOUS
  Filled 2020-08-12 (×5): qty 0.3

## 2020-08-12 MED ORDER — OXYCODONE HCL 5 MG PO TABS
5.0000 mg | ORAL_TABLET | Freq: Every day | ORAL | Status: DC
Start: 2020-08-12 — End: 2020-08-12

## 2020-08-12 MED ORDER — SODIUM CHLORIDE 0.9 % IV SOLN
500.0000 mg | INTRAVENOUS | Status: DC
  Administered 2020-08-13: 500 mg via INTRAVENOUS
  Filled 2020-08-12: qty 500

## 2020-08-12 MED ORDER — SODIUM CHLORIDE 0.9 % IV SOLN: 250.0000 mL | INTRAVENOUS | Status: DC | PRN

## 2020-08-12 MED ORDER — SODIUM CHLORIDE 0.9 % IV SOLN
2.0000 g | INTRAVENOUS | Status: DC
  Administered 2020-08-13 – 2020-08-16 (×4): 2 g via INTRAVENOUS
  Filled 2020-08-12 (×4): qty 20

## 2020-08-12 MED ORDER — SODIUM CHLORIDE 0.9 % IV SOLN
500.0000 mg | Freq: Once | INTRAVENOUS | Status: AC
  Administered 2020-08-12: 500 mg via INTRAVENOUS
  Filled 2020-08-12: qty 500

## 2020-08-12 MED ORDER — FUROSEMIDE 10 MG/ML IJ SOLN
40.0000 mg | Freq: Every day | INTRAMUSCULAR | Status: DC
  Administered 2020-08-12 – 2020-08-16 (×5): 40 mg via INTRAVENOUS
  Filled 2020-08-12 (×5): qty 4

## 2020-08-12 MED ORDER — ASPIRIN EC 81 MG PO TBEC
81.0000 mg | DELAYED_RELEASE_TABLET | Freq: Every day | ORAL | Status: DC
  Administered 2020-08-12 – 2020-08-16 (×5): 81 mg via ORAL
  Filled 2020-08-12 (×5): qty 1

## 2020-08-12 MED ORDER — OXYCODONE HCL 5 MG PO TABS
5.0000 mg | ORAL_TABLET | Freq: Four times a day (QID) | ORAL | Status: DC | PRN
  Filled 2020-08-12: qty 1

## 2020-08-12 MED ORDER — AMLODIPINE BESYLATE 5 MG PO TABS
5.0000 mg | ORAL_TABLET | Freq: Every day | ORAL | Status: DC
  Administered 2020-08-12 – 2020-08-16 (×5): 5 mg via ORAL
  Filled 2020-08-12 (×5): qty 1

## 2020-08-12 MED ORDER — SODIUM CHLORIDE 0.9% FLUSH
3.0000 mL | Freq: Two times a day (BID) | INTRAVENOUS | Status: DC
  Administered 2020-08-12 – 2020-08-16 (×9): 3 mL via INTRAVENOUS

## 2020-08-12 MED ORDER — SODIUM CHLORIDE 0.9 % IV SOLN
2.0000 g | Freq: Once | INTRAVENOUS | Status: AC
  Administered 2020-08-12: 2 g via INTRAVENOUS
  Filled 2020-08-12: qty 20

## 2020-08-12 MED ORDER — FAMOTIDINE 20 MG PO TABS
20.0000 mg | ORAL_TABLET | Freq: Every day | ORAL | Status: DC
  Administered 2020-08-12 – 2020-08-16 (×4): 20 mg via ORAL
  Filled 2020-08-12 (×4): qty 1

## 2020-08-12 MED ORDER — SODIUM CHLORIDE 0.9% FLUSH
3.0000 mL | INTRAVENOUS | Status: DC | PRN
  Administered 2020-08-12: 3 mL via INTRAVENOUS

## 2020-08-12 MED ORDER — THYROID 30 MG PO TABS
30.0000 mg | ORAL_TABLET | Freq: Every day | ORAL | Status: DC
  Administered 2020-08-13 – 2020-08-15 (×3): 30 mg via ORAL
  Filled 2020-08-12 (×5): qty 1

## 2020-08-12 MED ORDER — FAMOTIDINE 20 MG PO TABS: 40.0000 mg | ORAL_TABLET | Freq: Every day | ORAL | Status: DC

## 2020-08-12 NOTE — H&P (Signed)
History and Physical    Courtney Manning NFA:[id number] DOB: 11-Sep-1929 DOA: 08/12/2020  PCP: Idelle Crouch, MD   Patient coming from: Home  I have personally briefly reviewed patient's old medical records in [place]  Chief Complaint: Shortness of breath  HPI: Courtney Manning is a 86 y.o. female with medical history significant for hypertension, spinal stenosis, thyroid disease, glaucoma, GERD, stage IV chronic kidney disease who was brought into the ER by EMS for evaluation of shortness of breath which has progressively worsened over the last couple of days.  When EMS arrived she was noted to have room air pulse oximetry of about 80% and she was placed on a nonrebreather mask with improvement in her pulse oximetry. She denies having any cough or fever and denies having any nausea, vomiting or any changes in her bowel habits. Upon arrival to the ER she was hypothermic and had chills.  Her temp was 96.1 and patient was placed on a Retail banker. Labs show sodium 136, potassium 4.5, chloride 102, bicarb 22, BUN 37, creatinine 1.8, calcium 9.0, alkaline phosphatase 56, albumin 3.9, AST 33, ALT 30, total protein 0.7, lactic acid 1.7, white count 18.3 with a left shift, hemoglobin 10.6, hematocrit 32.8, MCV 91.6, RDW 13.2, platelet count 339, PT 13, INR 1.7 Respiratory viral panel is negative CT scan of the chest without contrast shows collapse/consolidative opacity in both lower lobes, right greater than left with associated air bronchograms.  Imaging features suspicious for bibasilar pneumonia.  Mild mediastinal lymphadenopathy presumably reactive. Small to moderate bilateral pleural effusions. Chest x-ray reviewed by me shows bibasilar airspace disease and small bilateral pleural effusions. Twelve-lead EKG reviewed by me shows sinus tachycardia with PVCs and a left bundle branch block.   ED Course: Patient is an 86 year old female with multiple medical problems who was brought into the  emergency room by EMS for evaluation of worsening shortness of breath.  Patient was hypoxic in the field with room air pulse oximetry in the 80s requiring a nonrebreather mask to improve pulse oximetry to greater than 92%.  Chest x-ray shows bibasilar opacities and CT scan of the chest without contrast confirms bilateral pneumonia.  Patient's COVID-19 PCR test is negative.  Patient was also hypothermic requiring a bear hugger in the ER.  She received a dose of Rocephin and Zithromax and will be admitted to the hospital for further evaluation.  Review of Systems: As per HPI otherwise 10 point review of systems negative.    Past Medical History:  Diagnosis Date  . Anemia   . Cervical cancer ([place])    Uterine Cancer  . CHF (congestive heart failure) ([place])   . Chronic kidney disease   . Constipation   . GERD (gastroesophageal reflux disease)   . Glaucoma   . Hypertension   . Hypothyroidism   . Insomnia   . Spinal stenosis   . Thyroid disease     Past Surgical History:  Procedure Laterality Date  . ABDOMINAL HYSTERECTOMY    . BACK SURGERY    . COLONOSCOPY WITH PROPOFOL N/A 09/07/2016   Procedure: COLONOSCOPY WITH PROPOFOL;  Surgeon: Lollie Sails, MD;  Location: [facility] ENDOSCOPY;  Service: Endoscopy;  Laterality: N/A;  . ESOPHAGOGASTRODUODENOSCOPY (EGD) WITH PROPOFOL N/A 09/07/2016   Procedure: ESOPHAGOGASTRODUODENOSCOPY (EGD) WITH PROPOFOL;  Surgeon: Lollie Sails, MD;  Location: [facility] ENDOSCOPY;  Service: Endoscopy;  Laterality: N/A;  . HIP SURGERY    . JOINT REPLACEMENT     Hip Replacement  .  KYPHOPLASTY N/A 06/17/2015   Procedure: KYPHOPLASTY;  Surgeon: Hessie Knows, MD;  Location: [facility] ORS;  Service: Orthopedics;  Laterality: N/A;  . PACEMAKER INSERTION    . SPINE SURGERY       reports that she has never smoked. She has never used smokeless tobacco. She reports that she does not drink alcohol and does not use drugs.  Allergies  Allergen Reactions  . Codeine Anaphylaxis     Not Specified  . Brimonidine Tartrate Itching  . Fluoxetine Other (See Comments)    Reaction: Unknown  . Iodine   . Lodine [Etodolac]   . Morphine Itching  . Omeprazole Nausea And Vomiting  . Other Other (See Comments)  . Propoxyphene Other (See Comments)  . Brimonidine Itching and Other (See Comments)  . Ciprofloxacin Rash  . Gabapentin Rash    Family History  Problem Relation Age of Onset  . Diabetes Mellitus II Mother   . Diabetes Mellitus II Father   . Hypertension Father      Prior to Admission medications   Medication Sig Start Date End Date Taking? Authorizing Provider  amLODipine (NORVASC) 5 MG tablet Take 1 tablet (5 mg total) by mouth daily. 08/03/20  Yes Lorella Nimrod, MD  aspirin 81 MG EC tablet Take 81 mg by mouth daily.    Yes [provider]  famotidine (PEPCID) 40 MG tablet Take 40 mg by mouth at bedtime. 05/04/20  Yes [provider]  furosemide (LASIX) 20 MG tablet Take 1 tablet (20 mg total) by mouth daily as needed for fluid or edema. 08/02/20 08/02/21 Yes Lorella Nimrod, MD  oxyCODONE (OXY IR/ROXICODONE) 5 MG immediate release tablet Take 5 mg by mouth at bedtime.  08/01/15  Yes [provider]  thyroid (ARMOUR) 30 MG tablet Take 30 mg by mouth daily before breakfast.  03/07/17  Yes [provider]    Physical Exam: Vitals:   08/12/20 1014 08/12/20 1100 08/12/20 1130 08/12/20 1309  BP:  (!) 137/108 (!) 144/124 (!) 132/50  Pulse:  78 87 92  Resp:  15 (!) 26 18  Temp: (!) 96.1 F (35.6 C) 97.8 F (36.6 C)    TempSrc: Rectal Oral    SpO2:  100% 93% 91%  Weight:      Height:         Vitals:   08/12/20 1014 08/12/20 1100 08/12/20 1130 08/12/20 1309  BP:  (!) 137/108 (!) 144/124 (!) 132/50  Pulse:  78 87 92  Resp:  15 (!) 26 18  Temp: (!) 96.1 F (35.6 C) 97.8 F (36.6 C)    TempSrc: Rectal Oral    SpO2:  100% 93% 91%  Weight:      Height:        Constitutional: NAD, alert and oriented x 3.  Appears very  frail Eyes: PERRL, lids and conjunctivae pallor ENMT: Mucous membranes are dry.  Neck: normal, supple, no masses, no thyromegaly Respiratory: Scattered rhonchi at the bases, no wheezing, bibasilar crackles. Normal respiratory effort. No accessory muscle use.  Cardiovascular: Regular rate and rhythm, no murmurs / rubs / gallops. No extremity edema. 2+ pedal pulses. No carotid bruits.  Abdomen: no tenderness, no masses palpated. No hepatosplenomegaly. Bowel sounds positive.  Musculoskeletal: no clubbing / cyanosis. No joint deformity upper and lower extremities.  Skin: no rashes, lesions, ulcers.  Neurologic: No gross focal neurologic deficit. Psychiatric: Normal mood and affect.   Labs on Admission: I have personally reviewed following labs and imaging studies  CBC:  Recent Labs  Lab 08/12/20 0946  WBC 18.3*  NEUTROABS 16.7*  HGB 10.6*  HCT 32.8*  MCV 91.6  PLT 545   Basic Metabolic Panel: Recent Labs  Lab 08/12/20 0946  NA 136  K 4.5  CL 102  CO2 22  GLUCOSE 95  BUN 37*  CREATININE 1.80*  CALCIUM 9.0   GFR: Estimated Creatinine Clearance: 16 mL/min (A) (by C-G formula based on SCr of 1.8 mg/dL (H)). Liver Function Tests: Recent Labs  Lab 08/12/20 0946  AST 33  ALT 30  ALKPHOS 56  BILITOT 0.7  PROT 7.0  ALBUMIN 3.9   No results for input(s): LIPASE, AMYLASE in the last 168 hours. No results for input(s): AMMONIA in the last 168 hours. Coagulation Profile: Recent Labs  Lab 08/12/20 0946  INR 1.0   Cardiac Enzymes: No results for input(s): CKTOTAL, CKMB, CKMBINDEX, TROPONINI in the last 168 hours. BNP (last 3 results) No results for input(s): PROBNP in the last 8760 hours. HbA1C: No results for input(s): HGBA1C in the last 72 hours. CBG: No results for input(s): GLUCAP in the last 168 hours. Lipid Profile: No results for input(s): CHOL, HDL, LDLCALC, TRIG, CHOLHDL, LDLDIRECT in the last 72 hours. Thyroid Function Tests: No results for input(s): TSH,  T4TOTAL, FREET4, T3FREE, THYROIDAB in the last 72 hours. Anemia Panel: No results for input(s): VITAMINB12, FOLATE, FERRITIN, TIBC, IRON, RETICCTPCT in the last 72 hours. Urine analysis:    Component Value Date/Time   COLORURINE YELLOW (A) 07/30/2020 0835   APPEARANCEUR HAZY (A) 07/30/2020 0835   APPEARANCEUR Cloudy 12/26/2013 1622   LABSPEC 1.013 07/30/2020 0835   LABSPEC 1.011 12/26/2013 1622   PHURINE 5.0 07/30/2020 0835   GLUCOSEU 150 (A) 07/30/2020 0835   GLUCOSEU Negative 12/26/2013 1622   HGBUR NEGATIVE 07/30/2020 0835   BILIRUBINUR NEGATIVE 07/30/2020 0835   BILIRUBINUR Negative 12/26/2013 1622   KETONESUR NEGATIVE 07/30/2020 0835   PROTEINUR NEGATIVE 07/30/2020 0835   NITRITE NEGATIVE 07/30/2020 0835   LEUKOCYTESUR NEGATIVE 07/30/2020 0835   LEUKOCYTESUR 3+ 12/26/2013 1622    Radiological Exams on Admission: CT Chest Wo Contrast  Result Date: 08/12/2020 CLINICAL DATA:  Respiratory illness.  Hypoxia. EXAM: CT CHEST WITHOUT CONTRAST TECHNIQUE: Multidetector CT imaging of the chest was performed following the standard protocol without IV contrast. COMPARISON:  X-ray earlier today.  CT chest 10/15/2013 FINDINGS: Cardiovascular: Heart size upper normal. No substantial pericardial effusion. Coronary artery calcification is evident. Atherosclerotic calcification is noted in the wall of the thoracic aorta. Right permanent pacemaker noted. Mediastinum/Nodes: Upper normal lymph nodes are identified in the mediastinum measuring 10 mm short axis in the precarinal space and 11 mm short axis in the subcarinal station. No evidence for gross hilar lymphadenopathy although assessment is limited by the lack of intravenous contrast on today's study. Moderate hiatal hernia. The esophagus has normal imaging features. There is no axillary lymphadenopathy. Lungs/Pleura: Collapse/consolidative opacity is seen in both lower lobes, right greater than left with associated air bronchograms. Patchy  ground-glass attenuation in the lung apices is slightly more prominent on the right. Nodular thickening noted in the right major fissure. Small to moderate bilateral pleural effusions. Upper Abdomen: Subtle micro nodular liver contour raises the question of cirrhosis. Musculoskeletal: No worrisome lytic or sclerotic osseous abnormality. Status post lower thoracic vertebral augmentation. Healed sternal fracture again noted. IMPRESSION: 1. Collapse/consolidative opacity in both lower lobes, right greater than left, with associated air bronchograms. Imaging features suspicious for bibasilar pneumonia. Given subtle nodularity of the right major  fissure, follow-up recommended to ensure resolution. 2. Mild mediastinal lymphadenopathy, presumably reactive. Attention on follow-up recommended. 3. Small to moderate bilateral pleural effusions. 4. Subtle micro nodular liver contour raises the question of cirrhosis. 5. Moderate hiatal hernia. 6. Aortic Atherosclerosis (ICD10-I70.0). Electronically Signed   By: Misty Stanley M.D.   On: 08/12/2020 12:21   DG Chest Portable 1 View  Result Date: 08/12/2020 CLINICAL DATA:  Shortness of breath and hypoxia. EXAM: PORTABLE CHEST 1 VIEW COMPARISON:  07/31/2020 FINDINGS: 1020 hours. The cardio pericardial silhouette is enlarged. Bibasilar airspace disease compatible with atelectasis or pneumonia is similar to prior. Small bilateral pleural effusions again noted. Interstitial markings are diffusely coarsened with chronic features. Right permanent pacemaker remains in place. Bones are diffusely demineralized with lower thoracic thoracic augmentation. IMPRESSION: No substantial interval change. Bibasilar airspace disease and small bilateral pleural effusions. Electronically Signed   By: Misty Stanley M.D.   On: 08/12/2020 10:29    EKG: Independently reviewed.  Sinus rhythm with PVCs Left bundle branch block  Assessment/Plan Principal Problem:   Acute respiratory failure  (HCC) Active Problems:   Hypothyroidism   CKD stage 4 secondary to hypertension ([place])   CAP (community acquired pneumonia)     Acute respiratory failure Secondary to bilateral bacterial pneumonia and acute on chronic diastolic CHF  Patient was hypoxic in the field with room air pulse oximetry of 80% and initially required BiPAP to maintain pulse oximetry greater than 92% We will wean patient down to nasal cannula as tolerated     Community-acquired pneumonia Patient presented for evaluation of worsening shortness of breath and was noted to be hypoxic in the field as well as hypothermic requiring a bear hugger Chest x-ray shows bibasilar infiltrates COVID-19 PCR test is negative We will place patient on Rocephin and Zithromax Follow-up results of blood cultures We will request speech therapy consult for swallow function evaluation     Acute on chronic diastolic dysfunction CHF Patient with known history of CHF secondary to diastolic dysfunction who presents for evaluation of worsening shortness of breath Chest x-ray shows bilateral pleural effusions Place patient on Lasix 40 mg IV  daily Continue lisinopril Patient not on beta-blocker due to acute decompensation    Hypothyroidism Continue Armour Thyroid     Stage IV chronic kidney disease Renal function is stable    Hypertension Continue amlodipine and lisinopril    DVT prophylaxis: Lovenox Code Status: Full code Family Communication: Greater than 50% of time was spent discussing patient's condition and plan of care with her daughter, Ms Sheryn Bison over the phone.  All questions and concerns have been addressed.  She verbalizes understanding and agrees with the plan of care. Disposition Plan: Back to previous home environment Consults called: None    Sir Mallis MD [facility]     08/12/2020, 1:36 PM

## 2020-08-12 NOTE — Evaluation (Signed)
Clinical/Bedside Swallow Evaluation Patient Details  Name: Courtney Manning MRN: [id number] Date of Birth: 10-Jun-1929  Today's Date: 08/12/2020 Time: SLP Start Time (ACUTE ONLY): 47 SLP Stop Time (ACUTE ONLY): 1550 SLP Time Calculation (min) (ACUTE ONLY): 20 min  Past Medical History:  Past Medical History:  Diagnosis Date  . Anemia   . Cervical cancer ([place])    Uterine Cancer  . CHF (congestive heart failure) ([place])   . Chronic kidney disease   . Constipation   . GERD (gastroesophageal reflux disease)   . Glaucoma   . Hypertension   . Hypothyroidism   . Insomnia   . Spinal stenosis   . Thyroid disease    Past Surgical History:  Past Surgical History:  Procedure Laterality Date  . ABDOMINAL HYSTERECTOMY    . BACK SURGERY    . COLONOSCOPY WITH PROPOFOL N/A 09/07/2016   Procedure: COLONOSCOPY WITH PROPOFOL;  Surgeon: Lollie Sails, MD;  Location: [facility] ENDOSCOPY;  Service: Endoscopy;  Laterality: N/A;  . ESOPHAGOGASTRODUODENOSCOPY (EGD) WITH PROPOFOL N/A 09/07/2016   Procedure: ESOPHAGOGASTRODUODENOSCOPY (EGD) WITH PROPOFOL;  Surgeon: Lollie Sails, MD;  Location: [facility] ENDOSCOPY;  Service: Endoscopy;  Laterality: N/A;  . HIP SURGERY    . JOINT REPLACEMENT     Hip Replacement  . KYPHOPLASTY N/A 06/17/2015   Procedure: KYPHOPLASTY;  Surgeon: Hessie Knows, MD;  Location: [facility] ORS;  Service: Orthopedics;  Laterality: N/A;  . PACEMAKER INSERTION    . SPINE SURGERY     HPI:  Courtney Manning is a 86 y.o. female with medical history significant for hypertension, spinal stenosis, thyroid disease, glaucoma, GERD, stage IV chronic kidney disease who was brought into the ER by EMS for evaluation of shortness of breath which has progressively worsened over the last couple of days. CT scan of the chest without contrast shows collapse/consolidative opacity in both lower lobes, right greater than left with associated air bronchograms. Imaging features suspicious for bibasilar pneumonia. Mild  mediastinal lymphadenopathy presumably reactive.   Assessment / Plan / Recommendation Clinical Impression  Pertinent information: Per chart, pt has been c/o of regurgitation of food and liquids since 02/17/2020. At that time, chest x-ray showed mild infection, possible aspiration pneumonia. Pt states that she continues to have episodes of regurgitation. During this assessment, pt didn't regurgitate but she did belched after swallow throughout study. Pt didn't demonstrated any s/s of oropharyngeal dysphagia when consuming thin liquids via straw, puree and regular solids. At this time, pt's appears at risk of post prandial aspiration. Will defer to MD for further follow up.   SLP Visit Diagnosis: Dysphagia, unspecified (R13.10)    Aspiration Risk  Severe aspiration risk (post prandial aspiration)    Diet Recommendation Regular;Thin liquid   Liquid Administration via: Cup;Straw Medication Administration: Whole meds with liquid (if not tolerated, can administer whole in puree) Supervision: Patient able to self feed Compensations: Minimize environmental distractions;Slow rate;Small sips/bites Postural Changes: Seated upright at 90 degrees;Remain upright for at least 30 minutes after po intake    Other  Recommendations Recommended Consults: Consider GI evaluation;Consider esophageal assessment Oral Care Recommendations: Oral care BID   Follow up Recommendations None      Frequency and Duration            Prognosis        Swallow Study   General Date of Onset: 08/12/20 HPI: Courtney Manning is a 86 y.o. female with medical history significant for hypertension, spinal stenosis, thyroid disease, glaucoma, GERD, stage IV chronic kidney  disease who was brought into the ER by EMS for evaluation of shortness of breath which has progressively worsened over the last couple of days. CT scan of the chest without contrast shows collapse/consolidative opacity in both lower lobes, right greater than  left with associated air bronchograms. Imaging features suspicious for bibasilar pneumonia. Mild mediastinal lymphadenopathy presumably reactive. Type of Study: Bedside Swallow Evaluation Previous Swallow Assessment: none in chart Diet Prior to this Study: NPO Temperature Spikes Noted: No Respiratory Status: Nasal cannula History of Recent Intubation: No Behavior/Cognition: Alert;Cooperative;Pleasant mood (pleasantly confused) Oral Cavity Assessment: Within Functional Limits Oral Care Completed by SLP: Recent completion by staff Oral Cavity - Dentition: Missing dentition Vision: Functional for self-feeding Self-Feeding Abilities: Needs set up Patient Positioning: Upright in bed Baseline Vocal Quality: Normal Volitional Cough: Strong Volitional Swallow: Unable to elicit (d/t dry mouth from NPO status)    Oral/Motor/Sensory Function Overall Oral Motor/Sensory Function: Within functional limits   Ice Chips Ice chips: Not tested   Thin Liquid Thin Liquid: Within functional limits Presentation: Self Fed;Straw    Nectar Thick Nectar Thick Liquid: Not tested   Honey Thick Honey Thick Liquid: Not tested   Puree Puree: Within functional limits Presentation: Self Fed;Spoon   Solid     Solid: Within functional limits Presentation: Self Fed     Raoul Ciano B. Rutherford Nail M.S., CCC-SLP, [place] Office [place] 08/12/2020,4:28 PM

## 2020-08-12 NOTE — Plan of Care (Signed)
  Problem: Health Behavior/Discharge Planning: Goal: Ability to manage health-related needs will improve Outcome: Progressing   Problem: Activity: Goal: Risk for activity intolerance will decrease Outcome: Progressing   Problem: Nutrition: Goal: Adequate nutrition will be maintained Outcome: Progressing   

## 2020-08-12 NOTE — ED Notes (Signed)
Md made aware of body temp.

## 2020-08-12 NOTE — ED Provider Notes (Signed)
[facility] Emergency Department Provider Note  ____________________________________________  Time seen: Approximately 10:47 AM  I have reviewed the triage vital signs and the nursing notes.   HISTORY  Chief Complaint Weakness (Pt comes into the ED via [facility] from home c/o new diagnoses weakness and low O2 sats @ 80%.  Pt is new diagnoses CHF.  94% nonrebreather, capnography 28, no change in weight, normally on room air. Pt is currently paced)    Level 5 Caveat: Portions of the History and Physical including HPI and review of systems are unable to be completely obtained due to patient being a poor historian   HPI Courtney Manning is a 86 y.o. female with a history of GERD hypothyroidism CHF CKD  who is brought to the ED due to shortness of breath and hypoxia.  She was just in the hospital 2 weeks ago for CHF, improved with diuresis.  Now at home, she has had worsening shortness of breath for the past few days.  EMS report that her room air oxygen saturation was 77%, improved to the 90s with nonrebreather.  Patient reports shortness of breath, denies chest pain.  She is visibly shivering.     Past Medical History:  Diagnosis Date  . Anemia   . Cervical cancer ([place])    Uterine Cancer  . CHF (congestive heart failure) ([place])   . Chronic kidney disease   . Constipation   . GERD (gastroesophageal reflux disease)   . Glaucoma   . Hypertension   . Hypothyroidism   . Insomnia   . Spinal stenosis   . Thyroid disease      Patient Active Problem List   Diagnosis Date Noted  . CAP (community acquired pneumonia) 08/12/2020  . Elevated troponin 07/30/2020  . Acute respiratory failure ([place]) 07/30/2020  . Acute CHF (congestive heart failure) ([place]) 07/30/2020  . Hypothyroidism   . Hypertension   . GERD (gastroesophageal reflux disease)   . CKD stage 4 secondary to hypertension ([place])   . Pain due to onychomycosis of toenails of both feet 06/21/2020  . Hyponatremia  10/02/2016  . Iron deficiency anemia due to chronic blood loss 05/23/2016  . Anemia due to other cause 05/16/2016     Past Surgical History:  Procedure Laterality Date  . ABDOMINAL HYSTERECTOMY    . BACK SURGERY    . COLONOSCOPY WITH PROPOFOL N/A 09/07/2016   Procedure: COLONOSCOPY WITH PROPOFOL;  Surgeon: Lollie Sails, MD;  Location[facility] ENDOSCOPY;  Service: Endoscopy;  Laterality: N/A;  . ESOPHAGOGASTRODUODENOSCOPY (EGD) WITH PROPOFOL N/A 09/07/2016   Procedure: ESOPHAGOGASTRODUODENOSCOPY (EGD) WITH PROPOFOL;  Surgeon: Lollie Sails, MD;  Location: [facility] ENDOSCOPY;  Service: Endoscopy;  Laterality: N/A;  . HIP SURGERY    . JOINT REPLACEMENT     Hip Replacement  . KYPHOPLASTY N/A 06/17/2015   Procedure: KYPHOPLASTY;  Surgeon: Hessie Knows, MD;  Location: [facility] ORS;  Service: Orthopedics;  Laterality: N/A;  . PACEMAKER INSERTION    . SPINE SURGERY       Prior to Admission medications   Medication Sig Start Date End Date Taking? Authorizing Provider  amLODipine (NORVASC) 5 MG tablet Take 1 tablet (5 mg total) by mouth daily. 08/03/20  Yes Lorella Nimrod, MD  aspirin 81 MG EC tablet Take 81 mg by mouth daily.    Yes [provider]  famotidine (PEPCID) 40 MG tablet Take 40 mg by mouth at bedtime. 05/04/20  Yes [provider]  furosemide (LASIX) 20 MG tablet Take  1 tablet (20 mg total) by mouth daily as needed for fluid or edema. 08/02/20 08/02/21 Yes Lorella Nimrod, MD  oxyCODONE (OXY IR/ROXICODONE) 5 MG immediate release tablet Take 5 mg by mouth at bedtime.  08/01/15  Yes [provider]  thyroid (ARMOUR) 30 MG tablet Take 30 mg by mouth daily before breakfast.  03/07/17  Yes [provider]     Allergies Codeine, Brimonidine tartrate, Fluoxetine, Iodine, Lodine [etodolac], Morphine, Omeprazole, Other, Propoxyphene, Brimonidine, Ciprofloxacin, and Gabapentin   Family History  Problem Relation Age of Onset  . Diabetes Mellitus II Mother   .  Diabetes Mellitus II Father   . Hypertension Father     Social History Social History   Tobacco Use  . Smoking status: Never Smoker  . Smokeless tobacco: Never Used  Vaping Use  . Vaping Use: Never used  Substance Use Topics  . Alcohol use: No  . Drug use: No    Review of Systems Level 5 Caveat: Portions of the History and Physical including HPI and review of systems are unable to be completely obtained due to patient being a poor historian   Constitutional:   No known fever.  Positive chills ENT:   No rhinorrhea. Cardiovascular:   No chest pain or syncope. Respiratory: Positive shortness of breath and cough. Gastrointestinal:   Negative for abdominal pain, vomiting and diarrhea.  Musculoskeletal:   Negative for focal pain or swelling ____________________________________________   PHYSICAL EXAM:  VITAL SIGNS: ED Triage Vitals  Enc Vitals Group     BP 08/12/20 0940 (!) 154/65     Pulse Rate 08/12/20 0940 93     Resp 08/12/20 0940 (!) 21     Temp 08/12/20 1014 (!) 96.1 F (35.6 C)     Temp Source 08/12/20 1014 Rectal     SpO2 08/12/20 0940 98 %     Weight 08/12/20 0936 113 lb 8 oz (51.5 kg)     Height 08/12/20 0936 5\' 1"  (1.549 m)     Head Circumference --      Peak Flow --      Pain Score 08/12/20 0935 0     Pain Loc --      Pain Edu? --      Excl. in [place]? --     Vital signs reviewed, nursing assessments reviewed.   Constitutional:   Alert and oriented.  Ill-appearing. Eyes:   Conjunctivae are normal. EOMI. PERRL. ENT      Head:   Normocephalic and atraumatic.      Nose:   No congestion/rhinnorhea.       Mouth/Throat:   Dry mucous membranes, no pharyngeal erythema. No peritonsillar mass.       Neck:   No meningismus. Full ROM. Hematological/Lymphatic/Immunilogical:   No cervical lymphadenopathy. Cardiovascular:   RRR. Symmetric bilateral radial and DP pulses.  No murmurs. Cap refill less than 2 seconds. Respiratory: Tachypnea, respiratory rate of 22.   Rhonchi in the left base.  No wheezing. Gastrointestinal:   Soft and nontender. Non distended. There is no CVA tenderness.  No rebound, rigidity, or guarding.  Musculoskeletal:   Normal range of motion in all extremities. No joint effusions.  No lower extremity tenderness.  No edema. Neurologic:   Normal speech and language.  Motor grossly intact. No acute focal neurologic deficits are appreciated.  Skin:    Skin is warm, dry and intact. No rash noted.  No petechiae, purpura, or bullae.  ____________________________________________    LABS (pertinent positives/negatives) (all  labs ordered are listed, but only abnormal results are displayed) Labs Reviewed  COMPREHENSIVE METABOLIC PANEL - Abnormal; Notable for the following components:      Result Value   BUN 37 (*)    Creatinine, Ser 1.80 (*)    GFR, Estimated 25 (*)    All other components within normal limits  CBC WITH DIFFERENTIAL/PLATELET - Abnormal; Notable for the following components:   WBC 18.3 (*)    RBC 3.58 (*)    Hemoglobin 10.6 (*)    HCT 32.8 (*)    Neutro Abs 16.7 (*)    Abs Immature Granulocytes 0.09 (*)    All other components within normal limits  BASIC METABOLIC PANEL - Abnormal; Notable for the following components:   BUN 31 (*)    Creatinine, Ser 1.63 (*)    GFR, Estimated 28 (*)    All other components within normal limits  CBC - Abnormal; Notable for the following components:   WBC 11.3 (*)    RBC 3.29 (*)    Hemoglobin 9.7 (*)    HCT 29.7 (*)    All other components within normal limits  GLUCOSE, CAPILLARY - Abnormal; Notable for the following components:   Glucose-Capillary 109 (*)    All other components within normal limits  BASIC METABOLIC PANEL - Abnormal; Notable for the following components:   BUN 30 (*)    Creatinine, Ser 1.52 (*)    GFR, Estimated 30 (*)    All other components within normal limits  CBC - Abnormal; Notable for the following components:   WBC 11.1 (*)    RBC 3.29 (*)     Hemoglobin 9.8 (*)    HCT 29.3 (*)    All other components within normal limits  RESPIRATORY PANEL BY RT PCR (FLU A&B, COVID)  CULTURE, BLOOD (ROUTINE X 2)  CULTURE, BLOOD (ROUTINE X 2)  PROTIME-INR  LACTIC ACID, PLASMA  LACTIC ACID, PLASMA  PROCALCITONIN  HIV ANTIBODY (ROUTINE TESTING W REFLEX)  MAGNESIUM   ____________________________________________   EKG  Interpreted by me Sinus rhythm rate of 92, left axis, left bundle branch block, no acute ischemic changes.  ____________________________________________    RADIOLOGY  No results found.  ____________________________________________   PROCEDURES .Critical Care Performed by: Carrie Mew, MD Authorized by: Carrie Mew, MD   Critical care provider statement:    Critical care time (minutes):  33   Critical care time was exclusive of:  Separately billable procedures and treating other patients   Critical care was necessary to treat or prevent imminent or life-threatening deterioration of the following conditions:  Respiratory failure and sepsis   Critical care was time spent personally by me on the following activities:  Development of treatment plan with patient or surrogate, discussions with consultants, evaluation of patient's response to treatment, examination of patient, obtaining history from patient or surrogate, ordering and performing treatments and interventions, ordering and review of laboratory studies, ordering and review of radiographic studies, pulse oximetry, re-evaluation of patient's condition and review of old charts    ____________________________________________  DIFFERENTIAL DIAGNOSIS   Pneumonia, pulmonary edema, COVID-19, pleural effusion  CLINICAL IMPRESSION / ASSESSMENT AND PLAN / ED COURSE  Medications ordered in the ED: Medications  aspirin EC tablet 81 mg (81 mg Oral Given 08/13/20 0938)  amLODipine (NORVASC) tablet 5 mg (5 mg Oral Given 08/13/20 0938)  thyroid (ARMOUR)  tablet 30 mg (30 mg Oral Given 08/14/20 0615)  oxyCODONE (Oxy IR/ROXICODONE) immediate release tablet 5 mg (has no administration  in time range)  enoxaparin (LOVENOX) injection 30 mg (30 mg Subcutaneous Given 08/13/20 1449)  sodium chloride flush (NS) 0.9 % injection 3 mL (3 mLs Intravenous Given 08/13/20 2200)  sodium chloride flush (NS) 0.9 % injection 3 mL (3 mLs Intravenous Given 08/12/20 1443)  0.9 %  sodium chloride infusion (has no administration in time range)  cefTRIAXone (ROCEPHIN) 2 g in sodium chloride 0.9 % 100 mL IVPB (2 g Intravenous New Bag/Given 08/13/20 1203)  furosemide (LASIX) injection 40 mg (40 mg Intravenous Given 08/13/20 0937)  famotidine (PEPCID) tablet 20 mg (20 mg Oral Given 08/13/20 2216)  feeding supplement (ENSURE ENLIVE / ENSURE PLUS) liquid 237 mL (237 mLs Oral Given 08/13/20 1449)  azithromycin (ZITHROMAX) tablet 500 mg (has no administration in time range)  cefTRIAXone (ROCEPHIN) 2 g in sodium chloride 0.9 % 100 mL IVPB (0 g Intravenous Stopped 08/12/20 1121)  azithromycin (ZITHROMAX) 500 mg in sodium chloride 0.9 % 250 mL IVPB (0 mg Intravenous Stopped 08/12/20 1309)    Pertinent labs & imaging results that were available during my care of the patient were reviewed by me and considered in my medical decision making (see chart for details).   Courtney Manning was evaluated in Emergency Department on 08/14/2020 for the symptoms described in the history of present illness. She was evaluated in the context of the global COVID-19 pandemic, which necessitated consideration that the patient might be at risk for infection with the SARS-CoV-2 virus that causes COVID-19. Institutional protocols and algorithms that pertain to the evaluation of patients at risk for COVID-19 are in a state of rapid change based on information released by regulatory bodies including the CDC and federal and state organizations. These policies and algorithms were followed during the patient's care  in the ED.   Patient presents with hypoxia requiring nonrebreather at this time.  Recently had a hospitalization due to CHF, improved with diuresis.  Currently presentation is worrisome for pneumonia and early sepsis.  Will check cultures lactate labs chest x-ray, give ceftriaxone and azithromycin.   ----------------------------------------- 11:02 AM on 08/12/2020 -----------------------------------------  Case discussed with hospitalist.  Will obtain noncontrast CT to help differentiate between pneumonia versus pulmonary edema.      ____________________________________________   FINAL CLINICAL IMPRESSION(S) / ED DIAGNOSES    Final diagnoses:  Acute respiratory failure with hypoxia [facility])     ED Discharge Orders    None      Portions of this note were generated with dragon dictation software. Dictation errors may occur despite best attempts at proofreading.   Carrie Mew, MD 08/14/20 [phone]

## 2020-08-13 DIAGNOSIS — J9601 Acute respiratory failure with hypoxia: Secondary | ICD-10-CM | POA: Diagnosis not present

## 2020-08-13 LAB — CBC
HCT: 29.7 % — ABNORMAL LOW (ref 36.0–46.0)
Hemoglobin: 9.7 g/dL — ABNORMAL LOW (ref 12.0–15.0)
MCH: 29.5 pg (ref 26.0–34.0)
MCHC: 32.7 g/dL (ref 30.0–36.0)
MCV: 90.3 fL (ref 80.0–100.0)
Platelets: 299 10*3/uL (ref 150–400)
RBC: 3.29 MIL/uL — ABNORMAL LOW (ref 3.87–5.11)
RDW: 13.2 % (ref 11.5–15.5)
WBC: 11.3 10*3/uL — ABNORMAL HIGH (ref 4.0–10.5)
nRBC: 0 % (ref 0.0–0.2)

## 2020-08-13 LAB — BASIC METABOLIC PANEL
Anion gap: 11 (ref 5–15)
BUN: 31 mg/dL — ABNORMAL HIGH (ref 8–23)
CO2: 24 mmol/L (ref 22–32)
Calcium: 9 mg/dL (ref 8.9–10.3)
Chloride: 104 mmol/L (ref 98–111)
Creatinine, Ser: 1.63 mg/dL — ABNORMAL HIGH (ref 0.44–1.00)
GFR, Estimated: 28 mL/min — ABNORMAL LOW (ref >60)
Glucose, Bld: 95 mg/dL (ref 70–99)
Potassium: 3.8 mmol/L (ref 3.5–5.1)
Sodium: 139 mmol/L (ref 135–145)

## 2020-08-13 MED ORDER — AZITHROMYCIN 250 MG PO TABS
500.0000 mg | ORAL_TABLET | Freq: Every day | ORAL | Status: AC
  Administered 2020-08-14 – 2020-08-16 (×3): 500 mg via ORAL
  Filled 2020-08-13 (×3): qty 2

## 2020-08-13 MED ORDER — ENSURE ENLIVE PO LIQD
237.0000 mL | Freq: Two times a day (BID) | ORAL | Status: DC
  Administered 2020-08-13 – 2020-08-16 (×3): 237 mL via ORAL

## 2020-08-13 NOTE — Progress Notes (Signed)
PROGRESS NOTE    Courtney Manning  GEX:[id number] DOB: 04-05-1929 DOA: 08/12/2020 PCP: Idelle Crouch, MD    Assessment & Plan:   Principal Problem:   Acute respiratory failure [facility]) Active Problems:   Hypothyroidism   CKD stage 4 secondary to hypertension ([place])   CAP (community acquired pneumonia)    Courtney Manning is a 86 y.o. female with medical history significant for hypertension, spinal stenosis, thyroid disease, glaucoma, GERD, stage IV chronic kidney disease who was brought into the ER by EMS for evaluation of shortness of breath which has progressively worsened over the last couple of days.  When EMS arrived she was noted to have room air pulse oximetry of about 80% and she was placed on a nonrebreather mask with improvement in her pulse oximetry.   Acute respiratory failure Secondary to bilateral bacterial pneumonia and acute on chronic diastolic CHF  Patient was hypoxic in the field with room air pulse oximetry of 80% and initially required BiPAP to maintain pulse oximetry greater than 92% --weaned down to 4L today PLAN: --treat underlying causes --wean to RA as able  Community-acquired pneumonia Patient presented for evaluation of worsening shortness of breath and was noted to be hypoxic in the field as well as hypothermic requiring a bear hugger Chest x-ray shows bibasilar infiltrates COVID-19 PCR test is negative --started on Rocephin and Zithromax PLAN: --cont ceftriaxone and azithromycin  Acute on chronic diastolic dysfunction CHF Bilateral pleural effusion Chest x-ray shows bilateral pleural effusions --started on Lasix 40 mg IV  daily on admission PLAN: --cont IV lasix 40 mg daily  Hypothyroidism --cont home Armour   Stage IV chronic kidney disease Renal function is stable  Hypertension --cont amlodipine --on IV lasix   DVT prophylaxis: Lovenox SQ Code Status: Full code  Family Communication:  Status is: inpatient Dispo:   The patient  is from: home Anticipated d/c is to: home Anticipated d/c date is: 2-3 days Patient currently is not medically stable to d/c due to: severe hypoxia being treated for bilateral PNA and CHF exacerbation.   Subjective and Interval History:  Courtney Manning reported feeling bad, but couldn't specify how.  Couldn't remember how she got to the hospital.  Denied cough.  Did say she had had PNA before.   Objective: Vitals:   08/13/20 1300 08/13/20 1658 08/13/20 1951 08/14/20 0419  BP:  119/62 113/84 (!) 144/63  Pulse: 78 79 79 99  Resp:   16 16  Temp:  98.3 F (36.8 C) 97.7 F (36.5 C) 98.7 F (37.1 C)  TempSrc:  Oral Oral Oral  SpO2:  96% 99% 94%  Weight:    50.1 kg  Height:        Intake/Output Summary (Last 24 hours) at 08/14/2020 0501 Last data filed at 08/13/2020 2000 Gross per 24 hour  Intake 590 ml  Output 451 ml  Net 139 ml   Filed Weights   08/12/20 1509 08/13/20 0403 08/14/20 0419  Weight: 47.4 kg 50.1 kg 50.1 kg    Examination:   Constitutional: NAD, lethargic, confused HEENT: conjunctivae and lids normal, EOMI CV: RRR no M,R,G. Distal pulses +2.  No cyanosis.   RESP: 4L [place] GI: +BS, NTND Extremities: No effusions, edema, or tenderness in BLE SKIN: warm, dry and intact Neuro: II - XII grossly intact.     Data Reviewed: I have personally reviewed following labs and imaging studies  CBC: Recent Labs  Lab 08/12/20 0946 08/13/20 0438  WBC 18.3* 11.3*  NEUTROABS 16.7*  --  HGB 10.6* 9.7*  HCT 32.8* 29.7*  MCV 91.6 90.3  PLT 339 628   Basic Metabolic Panel: Recent Labs  Lab 08/12/20 0946 08/13/20 0438  NA 136 139  K 4.5 3.8  CL 102 104  CO2 22 24  GLUCOSE 95 95  BUN 37* 31*  CREATININE 1.80* 1.63*  CALCIUM 9.0 9.0   GFR: Estimated Creatinine Clearance: 18.5 mL/min (A) (by C-G formula based on SCr of 1.63 mg/dL (H)). Liver Function Tests: Recent Labs  Lab 08/12/20 0946  AST 33  ALT 30  ALKPHOS 56  BILITOT 0.7  PROT 7.0  ALBUMIN 3.9   No  results for input(s): LIPASE, AMYLASE in the last 168 hours. No results for input(s): AMMONIA in the last 168 hours. Coagulation Profile: Recent Labs  Lab 08/12/20 0946  INR 1.0   Cardiac Enzymes: No results for input(s): CKTOTAL, CKMB, CKMBINDEX, TROPONINI in the last 168 hours. BNP (last 3 results) No results for input(s): PROBNP in the last 8760 hours. HbA1C: No results for input(s): HGBA1C in the last 72 hours. CBG: Recent Labs  Lab 08/12/20 2014  GLUCAP 109*   Lipid Profile: No results for input(s): CHOL, HDL, LDLCALC, TRIG, CHOLHDL, LDLDIRECT in the last 72 hours. Thyroid Function Tests: No results for input(s): TSH, T4TOTAL, FREET4, T3FREE, THYROIDAB in the last 72 hours. Anemia Panel: No results for input(s): VITAMINB12, FOLATE, FERRITIN, TIBC, IRON, RETICCTPCT in the last 72 hours. Sepsis Labs: Recent Labs  Lab 08/12/20 1009 08/12/20 1509  PROCALCITON  --  0.15  LATICACIDVEN 1.7 1.1    Recent Results (from the past 240 hour(s))  Respiratory Panel by RT PCR (Flu A&B, Covid) - Nasopharyngeal Swab     Status: None   Collection Time: 08/12/20  9:46 AM   Specimen: Nasopharyngeal Swab  Result Value Ref Range Status   SARS Coronavirus 2 by RT PCR NEGATIVE NEGATIVE Final    Comment: (NOTE) SARS-CoV-2 target nucleic acids are NOT DETECTED.  The SARS-CoV-2 RNA is generally detectable in upper respiratoy specimens during the acute phase of infection. The lowest concentration of SARS-CoV-2 viral copies this assay can detect is 131 copies/mL. A negative result does not preclude SARS-Cov-2 infection and should not be used as the sole basis for treatment or other patient management decisions. A negative result may occur with  improper specimen collection/handling, submission of specimen other than nasopharyngeal swab, presence of viral mutation(s) within the areas targeted by this assay, and inadequate number of viral copies (<131 copies/mL). A negative result must be  combined with clinical observations, patient history, and epidemiological information. The expected result is Negative.  Fact Sheet for Patients:  PinkCheek.be  Fact Sheet for Healthcare Providers:  GravelBags.it  This test is no t yet approved or cleared by the [place] FDA and  has been authorized for detection and/or diagnosis of SARS-CoV-2 by FDA under an Emergency Use Authorization (EUA). This EUA will remain  in effect (meaning this test can be used) for the duration of the COVID-19 declaration under Section 564(b)(1) of the Act, 21 U.S.C. section 360bbb-3(b)(1), unless the authorization is terminated or revoked sooner.     Influenza A by PCR NEGATIVE NEGATIVE Final   Influenza B by PCR NEGATIVE NEGATIVE Final    Comment: (NOTE) The Xpert Xpress SARS-CoV-2/FLU/RSV assay is intended as an aid in  the diagnosis of influenza from Nasopharyngeal swab specimens and  should not be used as a sole basis for treatment. Nasal washings and  aspirates are unacceptable for Xpert  Xpress SARS-CoV-2/FLU/RSV  testing.  Fact Sheet for Patients: PinkCheek.be  Fact Sheet for Healthcare Providers: GravelBags.it  This test is not yet approved or cleared by the [place] FDA and  has been authorized for detection and/or diagnosis of SARS-CoV-2 by  FDA under an Emergency Use Authorization (EUA). This EUA will remain  in effect (meaning this test can be used) for the duration of the  Covid-19 declaration under Section 564(b)(1) of the Act, 21  U.S.C. section 360bbb-3(b)(1), unless the authorization is  terminated or revoked. Performed at [facility], [address] [place], [place] [address]   Culture, blood (routine x 2)     Status: None (Preliminary result)   Collection Time: 08/12/20  9:46 AM   Specimen: BLOOD  Result Value Ref Range Status   Specimen  Description BLOOD LEFT ANTECUBITAL  Final   Special Requests   Final    BOTTLES DRAWN AEROBIC AND ANAEROBIC Blood Culture results may not be optimal due to an excessive volume of blood received in culture bottles   Culture   Final    NO GROWTH < 24 HOURS Performed at [facility], [address] [place], [place] [address]    Report Status PENDING  Incomplete  Culture, blood (routine x 2)     Status: None (Preliminary result)   Collection Time: 08/12/20 10:09 AM   Specimen: BLOOD  Result Value Ref Range Status   Specimen Description BLOOD BLOOD RIGHT FOREARM  Final   Special Requests   Final    BOTTLES DRAWN AEROBIC AND ANAEROBIC Blood Culture adequate volume   Culture   Final    NO GROWTH < 24 HOURS Performed at [facility], [address], [place], [place] [address]    Report Status PENDING  Incomplete      Radiology Studies: CT Chest Wo Contrast  Result Date: 08/12/2020 CLINICAL DATA:  Respiratory illness.  Hypoxia. EXAM: CT CHEST WITHOUT CONTRAST TECHNIQUE: Multidetector CT imaging of the chest was performed following the standard protocol without IV contrast. COMPARISON:  X-ray earlier today.  CT chest 10/15/2013 FINDINGS: Cardiovascular: Heart size upper normal. No substantial pericardial effusion. Coronary artery calcification is evident. Atherosclerotic calcification is noted in the wall of the thoracic aorta. Right permanent pacemaker noted. Mediastinum/Nodes: Upper normal lymph nodes are identified in the mediastinum measuring 10 mm short axis in the precarinal space and 11 mm short axis in the subcarinal station. No evidence for gross hilar lymphadenopathy although assessment is limited by the lack of intravenous contrast on today's study. Moderate hiatal hernia. The esophagus has normal imaging features. There is no axillary lymphadenopathy. Lungs/Pleura: Collapse/consolidative opacity is seen in both lower lobes, right greater than left with associated air  bronchograms. Patchy ground-glass attenuation in the lung apices is slightly more prominent on the right. Nodular thickening noted in the right major fissure. Small to moderate bilateral pleural effusions. Upper Abdomen: Subtle micro nodular liver contour raises the question of cirrhosis. Musculoskeletal: No worrisome lytic or sclerotic osseous abnormality. Status post lower thoracic vertebral augmentation. Healed sternal fracture again noted. IMPRESSION: 1. Collapse/consolidative opacity in both lower lobes, right greater than left, with associated air bronchograms. Imaging features suspicious for bibasilar pneumonia. Given subtle nodularity of the right major fissure, follow-up recommended to ensure resolution. 2. Mild mediastinal lymphadenopathy, presumably reactive. Attention on follow-up recommended. 3. Small to moderate bilateral pleural effusions. 4. Subtle micro nodular liver contour raises the question of cirrhosis. 5. Moderate hiatal hernia. 6. Aortic Atherosclerosis (ICD10-I70.0). Electronically Signed  By: Misty Stanley M.D.   On: 08/12/2020 12:21   DG Chest Portable 1 View  Result Date: 08/12/2020 CLINICAL DATA:  Shortness of breath and hypoxia. EXAM: PORTABLE CHEST 1 VIEW COMPARISON:  07/31/2020 FINDINGS: 1020 hours. The cardio pericardial silhouette is enlarged. Bibasilar airspace disease compatible with atelectasis or pneumonia is similar to prior. Small bilateral pleural effusions again noted. Interstitial markings are diffusely coarsened with chronic features. Right permanent pacemaker remains in place. Bones are diffusely demineralized with lower thoracic thoracic augmentation. IMPRESSION: No substantial interval change. Bibasilar airspace disease and small bilateral pleural effusions. Electronically Signed   By: Misty Stanley M.D.   On: 08/12/2020 10:29     Scheduled Meds:  amLODipine  5 mg Oral Daily   aspirin EC  81 mg Oral Daily   azithromycin  500 mg Oral Daily   enoxaparin  (LOVENOX) injection  30 mg Subcutaneous Q24H   famotidine  20 mg Oral QHS   feeding supplement  237 mL Oral BID BM   furosemide  40 mg Intravenous Daily   sodium chloride flush  3 mL Intravenous Q12H   thyroid  30 mg Oral Q0600   Continuous Infusions:  sodium chloride     cefTRIAXone (ROCEPHIN)  IV 2 g (08/13/20 1203)     LOS: 2 days     Enzo Bi, MD [facility] If 7PM-7AM, please contact night-coverage 08/14/2020, 5:01 AM

## 2020-08-13 NOTE — Progress Notes (Signed)
PHARMACIST - PHYSICIAN COMMUNICATION DR:   Billie Ruddy CONCERNING: Antibiotic IV to Oral Route Change Policy  RECOMMENDATION: This patient is receiving azithromycin by the intravenous route.  Based on criteria approved by the Pharmacy and Therapeutics Committee, the antibiotic(s) is/are being converted to the equivalent oral dose form(s).   DESCRIPTION: These criteria include:  Patient being treated for a respiratory tract infection, urinary tract infection, cellulitis or clostridium difficile associated diarrhea if on metronidazole  The patient is not neutropenic and does not exhibit a GI malabsorption state  The patient is eating (either orally or via tube) and/or has been taking other orally administered medications for a least 24 hours  The patient is improving clinically and has a Tmax < 100.5  If you have questions about this conversion, please contact the [place], PharmD, BCPS Clinical Pharmacist 08/13/2020 1:29 PM

## 2020-08-14 DIAGNOSIS — J9601 Acute respiratory failure with hypoxia: Secondary | ICD-10-CM | POA: Diagnosis not present

## 2020-08-14 LAB — CBC
HCT: 29.3 % — ABNORMAL LOW (ref 36.0–46.0)
Hemoglobin: 9.8 g/dL — ABNORMAL LOW (ref 12.0–15.0)
MCH: 29.8 pg (ref 26.0–34.0)
MCHC: 33.4 g/dL (ref 30.0–36.0)
MCV: 89.1 fL (ref 80.0–100.0)
Platelets: 295 10*3/uL (ref 150–400)
RBC: 3.29 MIL/uL — ABNORMAL LOW (ref 3.87–5.11)
RDW: 13.2 % (ref 11.5–15.5)
WBC: 11.1 10*3/uL — ABNORMAL HIGH (ref 4.0–10.5)
nRBC: 0 % (ref 0.0–0.2)

## 2020-08-14 LAB — BASIC METABOLIC PANEL
Anion gap: 12 (ref 5–15)
BUN: 30 mg/dL — ABNORMAL HIGH (ref 8–23)
CO2: 25 mmol/L (ref 22–32)
Calcium: 8.9 mg/dL (ref 8.9–10.3)
Chloride: 101 mmol/L (ref 98–111)
Creatinine, Ser: 1.52 mg/dL — ABNORMAL HIGH (ref 0.44–1.00)
GFR, Estimated: 30 mL/min — ABNORMAL LOW (ref >60)
Glucose, Bld: 94 mg/dL (ref 70–99)
Potassium: 3.6 mmol/L (ref 3.5–5.1)
Sodium: 138 mmol/L (ref 135–145)

## 2020-08-14 LAB — MAGNESIUM: Magnesium: 1.9 mg/dL (ref 1.7–2.4)

## 2020-08-14 NOTE — Plan of Care (Signed)
  Problem: Activity: Goal: Capacity to carry out activities will improve Outcome: Progressing   Problem: Health Behavior/Discharge Planning: Goal: Ability to manage health-related needs will improve Outcome: Progressing   Problem: Clinical Measurements: Goal: Respiratory complications will improve Outcome: Progressing Goal: Cardiovascular complication will be avoided Outcome: Progressing   Problem: Safety: Goal: Ability to remain free from injury will improve Outcome: Progressing

## 2020-08-14 NOTE — Progress Notes (Signed)
PROGRESS NOTE    Courtney Manning  YQI:[id number] DOB: [birth year] DOA: 08/12/2020 PCP: Idelle Crouch, MD    Assessment & Plan:   Principal Problem:   Acute respiratory failure [facility]) Active Problems:   Hypothyroidism   CKD stage 4 secondary to hypertension ([place])   CAP (community acquired pneumonia)    Courtney Manning is a 86 y.o. female with medical history significant for hypertension, spinal stenosis, thyroid disease, glaucoma, GERD, stage IV chronic kidney disease who was brought into the ER by EMS for evaluation of shortness of breath which has progressively worsened over the last couple of days.  When EMS arrived she was noted to have room air pulse oximetry of about 80% and she was placed on a nonrebreather mask with improvement in her pulse oximetry.   # Sepsis, POA --temp 96.1, tachycardic, WBC 18.3 on presentation.  Source is PNA. --Tx as below  # Acute respiratory failure 2/2 # bilateral bacterial pneumonia  # acute on chronic diastolic CHF  Patient was hypoxic in the field with room air pulse oximetry of 80% and initially required BiPAP to maintain pulse oximetry greater than 92% --weaned down to 2L today PLAN: --treat both PNA and CHF exacerbation --wean to RA as able  Community-acquired pneumonia Patient presented for evaluation of worsening shortness of breath and was noted to be hypoxic in the field as well as hypothermic requiring a bear hugger Chest x-ray shows bibasilar infiltrates COVID-19 PCR test is negative --started on Rocephin and Zithromax PLAN: --cont ceftriaxone and azithromcyin  Acute on chronic diastolic dysfunction CHF Bilateral pleural effusion Chest x-ray shows bilateral pleural effusions --started on Lasix 40 mg IV  daily on admission PLAN: --cont IV lasix 40 mg daily for now --Monitor Cr while diuresing  Hypothyroidism --cont home Armour   Stage IV chronic kidney disease Renal function is stable  Hypertension --cont  amlodipine --on IV lasix   DVT prophylaxis: Lovenox SQ Code Status: Full code  Family Communication: daughter updated on the phone today Status is: inpatient Dispo:   The patient is from: home Anticipated d/c is to: home Anticipated d/c date is: 1-2 days Patient currently is not medically stable to d/c due to: severe hypoxia being treated for bilateral PNA and CHF exacerbation.   Subjective and Interval History:  Pt was a lot more alert and coherent today.  No complaints.  Just want to go home.   Objective: Vitals:   08/14/20 1653 08/14/20 1955 08/15/20 0425 08/15/20 0757  BP: 121/72 (!) 141/64 (!) 145/66 132/77  Pulse: 84 88 91 83  Resp: 15 16 16 16   Temp: 98.5 F (36.9 C) 98.6 F (37 C) 97.7 F (36.5 C) 98.5 F (36.9 C)  TempSrc: Oral Oral Oral Oral  SpO2: 97% 98% 95% 96%  Weight:   49.1 kg   Height:        Intake/Output Summary (Last 24 hours) at 08/15/2020 1033 Last data filed at 08/15/2020 0811 Gross per 24 hour  Intake 240 ml  Output 1050 ml  Net -810 ml   Filed Weights   08/13/20 0403 08/14/20 0419 08/15/20 0425  Weight: 50.1 kg 50.1 kg 49.1 kg    Examination:   Constitutional: NAD, alert, oriented, coherent HEENT: conjunctivae and lids normal, EOMI CV: No cyanosis.   RESP: reduced lung sounds at bases, on 2L Extremities: No effusions, edema in BLE SKIN: warm, dry and intact Neuro: II - XII grossly intact.   Psych: Normal mood and affect.  Data Reviewed: I have personally reviewed following labs and imaging studies  CBC: Recent Labs  Lab 08/12/20 0946 08/13/20 0438 08/14/20 0452 08/15/20 0443  WBC 18.3* 11.3* 11.1* 10.8*  NEUTROABS 16.7*  --   --   --   HGB 10.6* 9.7* 9.8* 11.0*  HCT 32.8* 29.7* 29.3* 32.6*  MCV 91.6 90.3 89.1 88.8  PLT 339 299 295 161   Basic Metabolic Panel: Recent Labs  Lab 08/12/20 0946 08/13/20 0438 08/14/20 0452 08/15/20 0443  NA 136 139 138 137  K 4.5 3.8 3.6 4.0  CL 102 104 101 97*  CO2 22 24 25 28    GLUCOSE 95 95 94 91  BUN 37* 31* 30* 22  CREATININE 1.80* 1.63* 1.52* 1.44*  CALCIUM 9.0 9.0 8.9 9.2  MG  --   --  1.9 1.9   GFR: Estimated Creatinine Clearance: 20.5 mL/min (A) (by C-G formula based on SCr of 1.44 mg/dL (H)). Liver Function Tests: Recent Labs  Lab 08/12/20 0946  AST 33  ALT 30  ALKPHOS 56  BILITOT 0.7  PROT 7.0  ALBUMIN 3.9   No results for input(s): LIPASE, AMYLASE in the last 168 hours. No results for input(s): AMMONIA in the last 168 hours. Coagulation Profile: Recent Labs  Lab 08/12/20 0946  INR 1.0   Cardiac Enzymes: No results for input(s): CKTOTAL, CKMB, CKMBINDEX, TROPONINI in the last 168 hours. BNP (last 3 results) No results for input(s): PROBNP in the last 8760 hours. HbA1C: No results for input(s): HGBA1C in the last 72 hours. CBG: Recent Labs  Lab 08/12/20 2014  GLUCAP 109*   Lipid Profile: No results for input(s): CHOL, HDL, LDLCALC, TRIG, CHOLHDL, LDLDIRECT in the last 72 hours. Thyroid Function Tests: No results for input(s): TSH, T4TOTAL, FREET4, T3FREE, THYROIDAB in the last 72 hours. Anemia Panel: No results for input(s): VITAMINB12, FOLATE, FERRITIN, TIBC, IRON, RETICCTPCT in the last 72 hours. Sepsis Labs: Recent Labs  Lab 08/12/20 1009 08/12/20 1509  PROCALCITON  --  0.15  LATICACIDVEN 1.7 1.1    Recent Results (from the past 240 hour(s))  Respiratory Panel by RT PCR (Flu A&B, Covid) - Nasopharyngeal Swab     Status: None   Collection Time: 08/12/20  9:46 AM   Specimen: Nasopharyngeal Swab  Result Value Ref Range Status   SARS Coronavirus 2 by RT PCR NEGATIVE NEGATIVE Final    Comment: (NOTE) SARS-CoV-2 target nucleic acids are NOT DETECTED.  The SARS-CoV-2 RNA is generally detectable in upper respiratoy specimens during the acute phase of infection. The lowest concentration of SARS-CoV-2 viral copies this assay can detect is 131 copies/mL. A negative result does not preclude SARS-Cov-2 infection and should  not be used as the sole basis for treatment or other patient management decisions. A negative result may occur with  improper specimen collection/handling, submission of specimen other than nasopharyngeal swab, presence of viral mutation(s) within the areas targeted by this assay, and inadequate number of viral copies (<131 copies/mL). A negative result must be combined with clinical observations, patient history, and epidemiological information. The expected result is Negative.  Fact Sheet for Patients:  PinkCheek.be  Fact Sheet for Healthcare Providers:  GravelBags.it  This test is no t yet approved or cleared by the [place] FDA and  has been authorized for detection and/or diagnosis of SARS-CoV-2 by FDA under an Emergency Use Authorization (EUA). This EUA will remain  in effect (meaning this test can be used) for the duration of the COVID-19 declaration under Section  564(b)(1) of the Act, 21 U.S.C. section 360bbb-3(b)(1), unless the authorization is terminated or revoked sooner.     Influenza A by PCR NEGATIVE NEGATIVE Final   Influenza B by PCR NEGATIVE NEGATIVE Final    Comment: (NOTE) The Xpert Xpress SARS-CoV-2/FLU/RSV assay is intended as an aid in  the diagnosis of influenza from Nasopharyngeal swab specimens and  should not be used as a sole basis for treatment. Nasal washings and  aspirates are unacceptable for Xpert Xpress SARS-CoV-2/FLU/RSV  testing.  Fact Sheet for Patients: PinkCheek.be  Fact Sheet for Healthcare Providers: GravelBags.it  This test is not yet approved or cleared by the [place] FDA and  has been authorized for detection and/or diagnosis of SARS-CoV-2 by  FDA under an Emergency Use Authorization (EUA). This EUA will remain  in effect (meaning this test can be used) for the duration of the  Covid-19 declaration under  Section 564(b)(1) of the Act, 21  U.S.C. section 360bbb-3(b)(1), unless the authorization is  terminated or revoked. Performed at [facility], [address] [place], [place] [address]   Culture, blood (routine x 2)     Status: None (Preliminary result)   Collection Time: 08/12/20  9:46 AM   Specimen: BLOOD  Result Value Ref Range Status   Specimen Description BLOOD LEFT ANTECUBITAL  Final   Special Requests   Final    BOTTLES DRAWN AEROBIC AND ANAEROBIC Blood Culture results may not be optimal due to an excessive volume of blood received in culture bottles   Culture   Final    NO GROWTH 3 DAYS Performed at [facility], [address], [place], [place] [address]    Report Status PENDING  Incomplete  Culture, blood (routine x 2)     Status: None (Preliminary result)   Collection Time: 08/12/20 10:09 AM   Specimen: BLOOD  Result Value Ref Range Status   Specimen Description BLOOD BLOOD RIGHT FOREARM  Final   Special Requests   Final    BOTTLES DRAWN AEROBIC AND ANAEROBIC Blood Culture adequate volume   Culture   Final    NO GROWTH 3 DAYS Performed at [facility], [address], [place], [place] [address]    Report Status PENDING  Incomplete      Radiology Studies: No results found.   Scheduled Meds: . amLODipine  5 mg Oral Daily  . aspirin EC  81 mg Oral Daily  . azithromycin  500 mg Oral Daily  . enoxaparin (LOVENOX) injection  30 mg Subcutaneous Q24H  . famotidine  20 mg Oral QHS  . feeding supplement  237 mL Oral BID BM  . furosemide  40 mg Intravenous Daily  . sodium chloride flush  3 mL Intravenous Q12H  . thyroid  30 mg Oral Q0600   Continuous Infusions: . sodium chloride    . cefTRIAXone (ROCEPHIN)  IV 2 g (08/15/20 1026)     LOS: 3 days     Enzo Bi, MD [facility] If 7PM-7AM, please contact night-coverage 08/15/2020, 10:33 AM

## 2020-08-15 DIAGNOSIS — J189 Pneumonia, unspecified organism: Secondary | ICD-10-CM | POA: Diagnosis not present

## 2020-08-15 DIAGNOSIS — J9601 Acute respiratory failure with hypoxia: Secondary | ICD-10-CM | POA: Diagnosis not present

## 2020-08-15 LAB — CBC
HCT: 32.6 % — ABNORMAL LOW (ref 36.0–46.0)
Hemoglobin: 11 g/dL — ABNORMAL LOW (ref 12.0–15.0)
MCH: 30 pg (ref 26.0–34.0)
MCHC: 33.7 g/dL (ref 30.0–36.0)
MCV: 88.8 fL (ref 80.0–100.0)
Platelets: 315 10*3/uL (ref 150–400)
RBC: 3.67 MIL/uL — ABNORMAL LOW (ref 3.87–5.11)
RDW: 12.9 % (ref 11.5–15.5)
WBC: 10.8 10*3/uL — ABNORMAL HIGH (ref 4.0–10.5)
nRBC: 0 % (ref 0.0–0.2)

## 2020-08-15 LAB — BASIC METABOLIC PANEL
Anion gap: 12 (ref 5–15)
BUN: 22 mg/dL (ref 8–23)
CO2: 28 mmol/L (ref 22–32)
Calcium: 9.2 mg/dL (ref 8.9–10.3)
Chloride: 97 mmol/L — ABNORMAL LOW (ref 98–111)
Creatinine, Ser: 1.44 mg/dL — ABNORMAL HIGH (ref 0.44–1.00)
GFR, Estimated: 32 mL/min — ABNORMAL LOW (ref >60)
Glucose, Bld: 91 mg/dL (ref 70–99)
Potassium: 4 mmol/L (ref 3.5–5.1)
Sodium: 137 mmol/L (ref 135–145)

## 2020-08-15 LAB — MAGNESIUM: Magnesium: 1.9 mg/dL (ref 1.7–2.4)

## 2020-08-15 MED ORDER — QUETIAPINE FUMARATE 25 MG PO TABS
12.5000 mg | ORAL_TABLET | Freq: Every day | ORAL | Status: DC
  Administered 2020-08-16: 12.5 mg via ORAL
  Filled 2020-08-15: qty 1

## 2020-08-15 NOTE — Progress Notes (Signed)
PROGRESS NOTE    Courtney Manning  TDD:[id number] DOB: [birth year] DOA: 08/12/2020 PCP: Idelle Crouch, MD    Assessment & Plan:   Principal Problem:   Acute respiratory failure [facility]) Active Problems:   Hypothyroidism   CKD stage 4 secondary to hypertension ([place])   CAP (community acquired pneumonia)    Courtney Manning is a 86 y.o. female with medical history significant for hypertension, spinal stenosis, thyroid disease, glaucoma, GERD, stage IV chronic kidney disease who was brought into the ER by EMS for evaluation of shortness of breath which has progressively worsened over the last couple of days.  When EMS arrived she was noted to have room air pulse oximetry of about 80% and she was placed on a nonrebreather mask with improvement in her pulse oximetry.   # Sepsis, POA --temp 96.1, tachycardic, WBC 18.3 on presentation.  Source is PNA. --Tx as below  # Acute respiratory failure 2/2 # bilateral bacterial pneumonia  # acute on chronic diastolic CHF  Patient was hypoxic in the field with room air pulse oximetry of 80% and initially required BiPAP to maintain pulse oximetry greater than 92% --weaned down to RA today PLAN: --cont tx for both PNA and CHF  Community-acquired pneumonia Patient presented for evaluation of worsening shortness of breath and was noted to be hypoxic in the field as well as hypothermic requiring a bear hugger Chest x-ray shows bibasilar infiltrates COVID-19 PCR test is negative --started on Rocephin and Zithromax PLAN: --cont ceftriaxone and azithromycin  Acute on chronic diastolic dysfunction CHF Bilateral pleural effusion Chest x-ray shows bilateral pleural effusions --started on Lasix 40 mg IV daily on admission --Cr has been stable  PLAN: --cont IV lasix 40 mg daily --Strict I/O --Monitor Cr while diuresing  Hypothyroidism --cont home [employer]   Stage IV chronic kidney disease Renal function is stable  Hypertension --cont  amlodipine --on IV lasix  Developing hospital delirium --daughter to help with orientation --avoid sedatives during the day --low dose seroquel 12.5 mg nightly to help with sleep   DVT prophylaxis: Lovenox SQ Code Status: Full code  Family Communication: daughter updated at the bedside today Status is: inpatient Dispo:   The patient is from: home Anticipated d/c is to: home Anticipated d/c date is: 1-2 days Patient currently is not medically stable to d/c due to: being treated for bilateral PNA and CHF exacerbation with IV abx and IV lasix.     Subjective and Interval History:  Courtney Manning was more lethargic today, reported not sleeping well.  Courtney Manning noted to be more confused and pulling out her IV's.     Objective: Vitals:   08/15/20 0425 08/15/20 0757 08/15/20 1100 08/15/20 1700  BP: (!) 145/66 132/77 130/82 140/88  Pulse: 91 83 90 88  Resp: 16 16 16 16   Temp: 97.7 F (36.5 C) 98.5 F (36.9 C) 97.9 F (36.6 C) 98 F (36.7 C)  TempSrc: Oral Oral Oral Oral  SpO2: 95% 96% 92% 91%  Weight: 49.1 kg     Height:        Intake/Output Summary (Last 24 hours) at 08/15/2020 1715 Last data filed at 08/15/2020 1500 Gross per 24 hour  Intake 220 ml  Output 1400 ml  Net -1180 ml   Filed Weights   08/13/20 0403 08/14/20 0419 08/15/20 0425  Weight: 50.1 kg 50.1 kg 49.1 kg    Examination:   Constitutional: NAD, lethargic today HEENT: conjunctivae and lids normal, EOMI CV: RRR, systolic murmur.  No cyanosis.  RESP: normal respiratory effort, on RA Extremities: No effusions, edema in BLE SKIN: warm, dry and intact Neuro: II - XII grossly intact.     Data Reviewed: I have personally reviewed following labs and imaging studies  CBC: Recent Labs  Lab 08/12/20 0946 08/13/20 0438 08/14/20 0452 08/15/20 0443  WBC 18.3* 11.3* 11.1* 10.8*  NEUTROABS 16.7*  --   --   --   HGB 10.6* 9.7* 9.8* 11.0*  HCT 32.8* 29.7* 29.3* 32.6*  MCV 91.6 90.3 89.1 88.8  PLT 339 299 295 627    Basic Metabolic Panel: Recent Labs  Lab 08/12/20 0946 08/13/20 0438 08/14/20 0452 08/15/20 0443  NA 136 139 138 137  K 4.5 3.8 3.6 4.0  CL 102 104 101 97*  CO2 22 24 25 28   GLUCOSE 95 95 94 91  BUN 37* 31* 30* 22  CREATININE 1.80* 1.63* 1.52* 1.44*  CALCIUM 9.0 9.0 8.9 9.2  MG  --   --  1.9 1.9   GFR: Estimated Creatinine Clearance: 20.5 mL/min (A) (by C-G formula based on SCr of 1.44 mg/dL (H)). Liver Function Tests: Recent Labs  Lab 08/12/20 0946  AST 33  ALT 30  ALKPHOS 56  BILITOT 0.7  PROT 7.0  ALBUMIN 3.9   No results for input(s): LIPASE, AMYLASE in the last 168 hours. No results for input(s): AMMONIA in the last 168 hours. Coagulation Profile: Recent Labs  Lab 08/12/20 0946  INR 1.0   Cardiac Enzymes: No results for input(s): CKTOTAL, CKMB, CKMBINDEX, TROPONINI in the last 168 hours. BNP (last 3 results) No results for input(s): PROBNP in the last 8760 hours. HbA1C: No results for input(s): HGBA1C in the last 72 hours. CBG: Recent Labs  Lab 08/12/20 2014  GLUCAP 109*   Lipid Profile: No results for input(s): CHOL, HDL, LDLCALC, TRIG, CHOLHDL, LDLDIRECT in the last 72 hours. Thyroid Function Tests: No results for input(s): TSH, T4TOTAL, FREET4, T3FREE, THYROIDAB in the last 72 hours. Anemia Panel: No results for input(s): VITAMINB12, FOLATE, FERRITIN, TIBC, IRON, RETICCTPCT in the last 72 hours. Sepsis Labs: Recent Labs  Lab 08/12/20 1009 08/12/20 1509  PROCALCITON  --  0.15  LATICACIDVEN 1.7 1.1    Recent Results (from the past 240 hour(s))  Respiratory Panel by RT PCR (Flu A&B, Covid) - Nasopharyngeal Swab     Status: None   Collection Time: 08/12/20  9:46 AM   Specimen: Nasopharyngeal Swab  Result Value Ref Range Status   SARS Coronavirus 2 by RT PCR NEGATIVE NEGATIVE Final    Comment: (NOTE) SARS-CoV-2 target nucleic acids are NOT DETECTED.  The SARS-CoV-2 RNA is generally detectable in upper respiratoy specimens during the  acute phase of infection. The lowest concentration of SARS-CoV-2 viral copies this assay can detect is 131 copies/mL. A negative result does not preclude SARS-Cov-2 infection and should not be used as the sole basis for treatment or other patient management decisions. A negative result may occur with  improper specimen collection/handling, submission of specimen other than nasopharyngeal swab, presence of viral mutation(s) within the areas targeted by this assay, and inadequate number of viral copies (<131 copies/mL). A negative result must be combined with clinical observations, patient history, and epidemiological information. The expected result is Negative.  Fact Sheet for Patients:  PinkCheek.be  Fact Sheet for Healthcare Providers:  GravelBags.it  This test is no t yet approved or cleared by the [place] FDA and  has been authorized for detection and/or diagnosis of SARS-CoV-2 by FDA under  an Emergency Use Authorization (EUA). This EUA will remain  in effect (meaning this test can be used) for the duration of the COVID-19 declaration under Section 564(b)(1) of the Act, 21 U.S.C. section 360bbb-3(b)(1), unless the authorization is terminated or revoked sooner.     Influenza A by PCR NEGATIVE NEGATIVE Final   Influenza B by PCR NEGATIVE NEGATIVE Final    Comment: (NOTE) The Xpert Xpress SARS-CoV-2/FLU/RSV assay is intended as an aid in  the diagnosis of influenza from Nasopharyngeal swab specimens and  should not be used as a sole basis for treatment. Nasal washings and  aspirates are unacceptable for Xpert Xpress SARS-CoV-2/FLU/RSV  testing.  Fact Sheet for Patients: PinkCheek.be  Fact Sheet for Healthcare Providers: GravelBags.it  This test is not yet approved or cleared by the [place] FDA and  has been authorized for detection and/or diagnosis  of SARS-CoV-2 by  FDA under an Emergency Use Authorization (EUA). This EUA will remain  in effect (meaning this test can be used) for the duration of the  Covid-19 declaration under Section 564(b)(1) of the Act, 21  U.S.C. section 360bbb-3(b)(1), unless the authorization is  terminated or revoked. Performed at [facility], [address] [place], [place] [address]   Culture, blood (routine x 2)     Status: None (Preliminary result)   Collection Time: 08/12/20  9:46 AM   Specimen: BLOOD  Result Value Ref Range Status   Specimen Description BLOOD LEFT ANTECUBITAL  Final   Special Requests   Final    BOTTLES DRAWN AEROBIC AND ANAEROBIC Blood Culture results may not be optimal due to an excessive volume of blood received in culture bottles   Culture   Final    NO GROWTH 3 DAYS Performed at [facility], [address], [place], [place] [address]    Report Status PENDING  Incomplete  Culture, blood (routine x 2)     Status: None (Preliminary result)   Collection Time: 08/12/20 10:09 AM   Specimen: BLOOD  Result Value Ref Range Status   Specimen Description BLOOD BLOOD RIGHT FOREARM  Final   Special Requests   Final    BOTTLES DRAWN AEROBIC AND ANAEROBIC Blood Culture adequate volume   Culture   Final    NO GROWTH 3 DAYS Performed at [facility], [address], [place], [place] [address]    Report Status PENDING  Incomplete      Radiology Studies: No results found.   Scheduled Meds: . amLODipine  5 mg Oral Daily  . aspirin EC  81 mg Oral Daily  . azithromycin  500 mg Oral Daily  . enoxaparin (LOVENOX) injection  30 mg Subcutaneous Q24H  . famotidine  20 mg Oral QHS  . feeding supplement  237 mL Oral BID BM  . furosemide  40 mg Intravenous Daily  . QUEtiapine  12.5 mg Oral QHS  . sodium chloride flush  3 mL Intravenous Q12H  . thyroid  30 mg Oral Q0600   Continuous Infusions: . sodium chloride    . cefTRIAXone (ROCEPHIN)  IV 200 mL/hr  at 08/15/20 1100     LOS: 3 days     Enzo Bi, MD [facility] If 7PM-7AM, please contact night-coverage 08/15/2020, 5:15 PM

## 2020-08-15 NOTE — Progress Notes (Signed)
Mobility Specialist - Progress Note   08/15/20 1600  Mobility  Activity Ambulated in room  Level of Assistance Modified independent, requires aide device or extra time  Assistive Device Front wheel walker  Distance Ambulated (ft) 120 ft  Mobility Response Tolerated well  Mobility performed by Mobility specialist  $Mobility charge 1 Mobility    Pre-mobility: 94 HR, 92% SpO2 During mobility: 109 HR, 96% SpO2 Post-mobility: 104 HR, 97% SpO2   Pt was sitting EOB upon arrival. Pt agreed to session. Pt had just finished getting to [facility] with NT. Pt denied any pain, nausea, or fatigue. Pt was able to stand to RW with no physical assistance. Pt ambulated 120' in room with no LOB. Pt presented with steady gait/speed this date. Pt denied SOB. After ~75', pt c/o slight back pain but was motivated to complete a few more laps around room. Once returning to bed, pt c/o light SOB, O2 levels remained above 95% during and post session. Overall, pt tolerated session well. Pt remains in bed with all needs in reach and alarm set. Nurse was notified of performance.    Kathee Delton Mobility Specialist 08/15/20, 4:17 PM

## 2020-08-15 NOTE — Evaluation (Signed)
Physical Therapy Evaluation Patient Details Name: Courtney Manning MRN: [id number] DOB: [birth year] Today's Date: 08/15/2020   History of Present Illness  86 yo female with onset of acute respiratory failure and SOB was admitted and placed on nonrebreather.  Now diagnosed with PNA, pleural effusion, and PVC's noted.  PMHx:  pacemaker, atherosclerosis, HTN, L BBB, CHF, glaucoma, spinal stenosis, thyroid disease, CKD 4,   Clinical Impression  Pt was seen for mobility on RW with initially an unsafe presentation, stumbling and trying to carry the walker.  Her plan is to walk with RW with independence, but after corrections was Supervision level.  Pt continues to maintain O2 sats with mobility, but is requiring supervised cues for hand placement to transfer, to control the walker and to set up to sit down.  Follow acutely for these needs, and will continue on with home therapy when discharged.  Recommend she walk with staff as tolerated to increase her strength and safety awareness prior to discharge.    Follow Up Recommendations Home health PT;Supervision for mobility/OOB;Supervision/Assistance - 24 hour    Equipment Recommendations  Rolling walker with 5" wheels    Recommendations for Other Services       Precautions / Restrictions Precautions Precautions: Fall Precaution Comments: monitor O2 sats Restrictions Weight Bearing Restrictions: No      Mobility  Bed Mobility Overal bed mobility: Needs Assistance Bed Mobility: Supine to Sit;Sit to Supine     Supine to sit: Min assist Sit to supine: Min assist   General bed mobility comments: min assist to support trunk and to lift legs as needed  Transfers Overall transfer level: Needs assistance Equipment used: Rolling walker (2 wheeled);1 person hand held assist Transfers: Sit to/from Stand Sit to Stand: Min assist         General transfer comment: pt requires min assist to power up and steady  Ambulation/Gait Ambulation/Gait  assistance: Min guard;Min assist Gait Distance (Feet): 50 Feet (25 x 2) Assistive device: Rolling walker (2 wheeled);1 person hand held assist Gait Pattern/deviations: Step-through pattern;Decreased stride length;Trunk flexed;Wide base of support Gait velocity: reduced Gait velocity interpretation: <1.31 ft/sec, indicative of household ambulator General Gait Details: pt has spinal stiffness related to chronic degerative changes probably  Stairs            Wheelchair Mobility    Modified Rankin (Stroke Patients Only)       Balance Overall balance assessment: Needs assistance Sitting-balance support: Feet supported Sitting balance-Leahy Scale: Fair     Standing balance support: Bilateral upper extremity supported;During functional activity Standing balance-Leahy Scale: Poor Standing balance comment: better balance with walker                             Pertinent Vitals/Pain Pain Assessment: No/denies pain    Home Living Family/patient expects to be discharged to:: Private residence Living Arrangements: Spouse/significant other Available Help at Discharge: Family;Available 24 hours/day Type of Home: House Home Access: Stairs to enter Entrance Stairs-Rails: Right;Left;Can reach both Entrance Stairs-Number of Steps: 2 Home Layout: One level Home Equipment: Walker - 2 wheels;Cane - single point;Grab bars - toilet;Shower seat Additional Comments: husband is not well, unable to assist her but family there    Prior Function Level of Independence: Needs assistance   Gait / Transfers Assistance Needed: I on hurry cane but now on RW  ADL's / Homemaking Assistance Needed: family help with cooking and cleaning  Hand Dominance   Dominant Hand: Right    Extremity/Trunk Assessment   Upper Extremity Assessment Upper Extremity Assessment: Generalized weakness (B hips 4-)    Lower Extremity Assessment Lower Extremity Assessment: Generalized  weakness;RLE deficits/detail RLE Deficits / Details: RLE is 4- to4 + RLE Coordination: decreased gross motor    Cervical / Trunk Assessment Cervical / Trunk Assessment: Kyphotic  Communication   Communication: No difficulties  Cognition Arousal/Alertness: Awake/alert Behavior During Therapy: WFL for tasks assessed/performed Overall Cognitive Status: Within Functional Limits for tasks assessed                                 General Comments: daughter is providing some history details      General Comments General comments (skin integrity, edema, etc.): pt is on side of bed after sats measured at 96% in bed, sitting 97% and standing with gait were 98%    Exercises     Assessment/Plan    PT Assessment Patient needs continued PT services  PT Problem List Decreased strength;Decreased range of motion;Decreased activity tolerance;Decreased balance;Decreased mobility;Decreased coordination;Cardiopulmonary status limiting activity;Decreased safety awareness       PT Treatment Interventions DME instruction;Gait training;Stair training;Functional mobility training;Therapeutic activities;Therapeutic exercise;Balance training;Neuromuscular re-education;Patient/family education    PT Goals (Current goals can be found in the Care Plan section)  Acute Rehab PT Goals Patient Stated Goal: to go home and feel better PT Goal Formulation: With patient/family Time For Goal Achievement: 08/22/20 Potential to Achieve Goals: Good    Frequency Min 2X/week   Barriers to discharge Inaccessible home environment has stairs and is expected to walk more independently    Co-evaluation               AM-PAC PT "6 Clicks" Mobility  Outcome Measure Help needed turning from your back to your side while in a flat bed without using bedrails?: A Little Help needed moving from lying on your back to sitting on the side of a flat bed without using bedrails?: A Little Help needed moving to  and from a bed to a chair (including a wheelchair)?: A Little Help needed standing up from a chair using your arms (e.g., wheelchair or bedside chair)?: A Little Help needed to walk in hospital room?: A Little Help needed climbing 3-5 steps with a railing? : A Lot 6 Click Score: 17    End of Session Equipment Utilized During Treatment: Gait belt Activity Tolerance: Patient limited by fatigue;Treatment limited secondary to medical complications (Comment) Patient left: in bed;with call bell/phone within reach;with bed alarm set;with family/visitor present Nurse Communication: Mobility status PT Visit Diagnosis: Unsteadiness on feet (R26.81);Muscle weakness (generalized) (M62.81);Difficulty in walking, not elsewhere classified (R26.2)    Time: 2563-8937 PT Time Calculation (min) (ACUTE ONLY): 23 min   Charges:   PT Evaluation $PT Eval Moderate Complexity: 1 Mod PT Treatments $Gait Training: 8-22 mins       Ramond Dial 08/15/2020, 3:24 PM  Mee Hives, PT MS Acute Rehab Dept. Number: [place]

## 2020-08-15 NOTE — Plan of Care (Signed)
  Problem: Education: Goal: Knowledge of General Education information will improve Description: Including pain rating scale, medication(s)/side effects and non-pharmacologic comfort measures Outcome: Progressing   Problem: Health Behavior/Discharge Planning: Goal: Ability to manage health-related needs will improve Outcome: Progressing   Problem: Clinical Measurements: Goal: Respiratory complications will improve Outcome: Progressing   Problem: Elimination: Goal: Will not experience complications related to bowel motility Outcome: Progressing   Problem: Skin Integrity: Goal: Risk for impaired skin integrity will decrease Outcome: Progressing

## 2020-08-16 LAB — CBC
HCT: 32.9 % — ABNORMAL LOW (ref 36.0–46.0)
Hemoglobin: 11 g/dL — ABNORMAL LOW (ref 12.0–15.0)
MCH: 29.8 pg (ref 26.0–34.0)
MCHC: 33.4 g/dL (ref 30.0–36.0)
MCV: 89.2 fL (ref 80.0–100.0)
Platelets: 305 10*3/uL (ref 150–400)
RBC: 3.69 MIL/uL — ABNORMAL LOW (ref 3.87–5.11)
RDW: 13 % (ref 11.5–15.5)
WBC: 9.9 10*3/uL (ref 4.0–10.5)
nRBC: 0 % (ref 0.0–0.2)

## 2020-08-16 LAB — MAGNESIUM: Magnesium: 1.8 mg/dL (ref 1.7–2.4)

## 2020-08-16 LAB — BASIC METABOLIC PANEL
Anion gap: 12 (ref 5–15)
BUN: 23 mg/dL (ref 8–23)
CO2: 27 mmol/L (ref 22–32)
Calcium: 8.8 mg/dL — ABNORMAL LOW (ref 8.9–10.3)
Chloride: 98 mmol/L (ref 98–111)
Creatinine, Ser: 1.51 mg/dL — ABNORMAL HIGH (ref 0.44–1.00)
GFR, Estimated: 30 mL/min — ABNORMAL LOW (ref >60)
Glucose, Bld: 97 mg/dL (ref 70–99)
Potassium: 3.4 mmol/L — ABNORMAL LOW (ref 3.5–5.1)
Sodium: 137 mmol/L (ref 135–145)

## 2020-08-16 MED ORDER — POTASSIUM CHLORIDE CRYS ER 20 MEQ PO TBCR
40.0000 meq | EXTENDED_RELEASE_TABLET | Freq: Once | ORAL | Status: AC
  Administered 2020-08-16: 40 meq via ORAL
  Filled 2020-08-16: qty 2

## 2020-08-16 NOTE — Plan of Care (Signed)
Patient discharged to home accompanied with daughter via w/c by volunteer transportation services with all belongings. PIV removed by Probation officer. Daughter verbalized understanding of discharge instructions. NAD noted. Problem: Education: Goal: Ability to demonstrate management of disease process will improve 08/16/2020 1729 by Mayford Knife, RN Outcome: Completed/Met 08/16/2020 1729 by Mayford Knife, RN Outcome: Not Progressing Goal: Ability to verbalize understanding of medication therapies will improve 08/16/2020 1729 by Mayford Knife, RN Outcome: Completed/Met 08/16/2020 1729 by Mayford Knife, RN Outcome: Not Progressing Goal: Individualized Educational Video(s) 08/16/2020 1729 by Mayford Knife, RN Outcome: Completed/Met 08/16/2020 1729 by Mayford Knife, RN Outcome: Not Progressing   Problem: Activity: Goal: Capacity to carry out activities will improve 08/16/2020 1729 by Mayford Knife, RN Outcome: Completed/Met 08/16/2020 1729 by Mayford Knife, RN Outcome: Not Progressing   Problem: Cardiac: Goal: Ability to achieve and maintain adequate cardiopulmonary perfusion will improve 08/16/2020 1729 by Mayford Knife, RN Outcome: Completed/Met 08/16/2020 1729 by Mayford Knife, RN Outcome: Not Progressing   Problem: Education: Goal: Knowledge of General Education information will improve Description: Including pain rating scale, medication(s)/side effects and non-pharmacologic comfort measures 08/16/2020 1729 by Mayford Knife, RN Outcome: Completed/Met 08/16/2020 1729 by Mayford Knife, RN Outcome: Not Progressing   Problem: Health Behavior/Discharge Planning: Goal: Ability to manage health-related needs will improve 08/16/2020 1729 by Mayford Knife, RN Outcome: Completed/Met 08/16/2020 1729 by Mayford Knife, RN Outcome: Not Progressing   Problem: Clinical Measurements: Goal: Ability to maintain clinical measurements within  normal limits will improve 08/16/2020 1729 by Mayford Knife, RN Outcome: Completed/Met 08/16/2020 1729 by Mayford Knife, RN Outcome: Not Progressing Goal: Will remain free from infection 08/16/2020 1729 by Mayford Knife, RN Outcome: Completed/Met 08/16/2020 1729 by Mayford Knife, RN Outcome: Not Progressing Goal: Diagnostic test results will improve 08/16/2020 1729 by Mayford Knife, RN Outcome: Completed/Met 08/16/2020 1729 by Mayford Knife, RN Outcome: Not Progressing Goal: Respiratory complications will improve 08/16/2020 1729 by Mayford Knife, RN Outcome: Completed/Met 08/16/2020 1729 by Mayford Knife, RN Outcome: Not Progressing Goal: Cardiovascular complication will be avoided 08/16/2020 1729 by Mayford Knife, RN Outcome: Completed/Met 08/16/2020 1729 by Mayford Knife, RN Outcome: Not Progressing   Problem: Activity: Goal: Risk for activity intolerance will decrease 08/16/2020 1729 by Mayford Knife, RN Outcome: Completed/Met 08/16/2020 1729 by Mayford Knife, RN Outcome: Not Progressing   Problem: Nutrition: Goal: Adequate nutrition will be maintained 08/16/2020 1729 by Mayford Knife, RN Outcome: Completed/Met 08/16/2020 1729 by Mayford Knife, RN Outcome: Not Progressing   Problem: Coping: Goal: Level of anxiety will decrease 08/16/2020 1729 by Mayford Knife, RN Outcome: Completed/Met 08/16/2020 1729 by Mayford Knife, RN Outcome: Not Progressing   Problem: Elimination: Goal: Will not experience complications related to bowel motility 08/16/2020 1729 by Mayford Knife, RN Outcome: Completed/Met 08/16/2020 1729 by Mayford Knife, RN Outcome: Not Progressing Goal: Will not experience complications related to urinary retention 08/16/2020 1729 by Mayford Knife, RN Outcome: Completed/Met 08/16/2020 1729 by Mayford Knife, RN Outcome: Not Progressing   Problem: Pain Managment: Goal:  General experience of comfort will improve 08/16/2020 1729 by Mayford Knife, RN Outcome: Completed/Met 08/16/2020 1729 by Mayford Knife, RN Outcome: Not Progressing   Problem: Safety: Goal: Ability to remain free from injury will improve 08/16/2020 1729 by Mayford Knife, RN Outcome: Completed/Met 08/16/2020 1729 by Mayford Knife, RN Outcome: Not Progressing  Problem: Skin Integrity: Goal: Risk for impaired skin integrity will decrease 08/16/2020 1729 by Mayford Knife, RN Outcome: Completed/Met 08/16/2020 1729 by Mayford Knife, RN Outcome: Not Progressing

## 2020-08-16 NOTE — Care Management Important Message (Signed)
Important Message  Patient Details  Name: Courtney Manning MRN: [id number] Date of Birth: [birth year]   Medicare Important Message Given:  Yes     Dannette Barbara 08/16/2020, 3:18 PM

## 2020-08-16 NOTE — Discharge Summary (Signed)
Physician Discharge Summary   Courtney Manning  female DOB: Apr 13, 1929  VCB:[id number]  PCP: Idelle Crouch, MD  Admit date: 08/12/2020 Discharge date: 08/16/2020  Admitted From: home Disposition:  home CODE STATUS: Full code  Discharge Instructions    Discharge instructions   Complete by: As directed    You have completed 5 days of IV antibiotic for your pneumonia.  And you are now able to walk around without supplemental oxygen.  You can go home to continue to recover.     Dr. Enzo Bi [facility] Course:  For full details, please see H&P, progress notes, consult notes and ancillary notes.  Briefly,  Courtney Manning a 86 y.o.femalewith medical history significant forhypertension, spinal stenosis, thyroid disease, glaucoma, GERD,stage IV chronic kidney diseasewho was brought into the ER by EMS for evaluation ofshortness of breath which had progressively worsened over the last couple of days.  When EMS arrived she was noted to have room air pulse oximetry of about 80% and she was placed on a nonrebreather mask with improvement in her pulse oximetry.   # Sepsis, POA temp 96.1, tachycardic, WBC 18.3 on presentation.  Source is PNA.  # Acute respiratory failure 2/2 # bilateral bacterial pneumonia  # acuteon chronic diastolicCHF  Patient was hypoxic in the field with room air pulse oximetry of80% andinitially required BiPAP to maintain pulse oximetry greater than 92%  Pt was weaned down to RA prior to discharge.  Pt was treated for both PNA and CHF as below.  Community-acquired pneumonia Pt was noted to be hypoxic in the field as well as hypothermic requiring a bear hugger.  Chest x-ray shows bibasilar infiltrates.   COVID-19 PCR test was negative.  Pt was started on Rocephin and Zithromax on presentation and completed a 5-day course.  Acuteon chronic diastolic dysfunctionCHF Bilateral pleural effusion Chest x-ray shows bilateral pleural  effusions.  Pt received Lasix 40 mg IV daily during hospitalization with stable Cr.  Pt was discharged on home Lasix 20 mg daily PRN.  Hypothyroidism Continued home Armour   Stage IV chronic kidney disease Renal function is stable  Hypertension Continued home amlodipine.  Received IV lasix while inpatient.  Developing hospital delirium daughter to help with orientation.  avoided sedatives during the day.  low dose seroquel 12.5 mg nightly to help with sleep   Discharge Diagnoses:  Principal Problem:   Acute respiratory failure ([place]) Active Problems:   Hypothyroidism   CKD stage 4 secondary to hypertension ([place])   CAP (community acquired pneumonia)    Discharge Instructions:  Allergies as of 08/16/2020      Reactions   Codeine Anaphylaxis   Not Specified   Brimonidine Tartrate Itching   Fluoxetine Other (See Comments)   Reaction: Unknown   Iodine    Lodine [etodolac]    Morphine Itching   Omeprazole Nausea And Vomiting   Other Other (See Comments)   Propoxyphene Other (See Comments)   Brimonidine Itching, Other (See Comments)   Ciprofloxacin Rash   Gabapentin Rash      Medication List    TAKE these medications   amLODipine 5 MG tablet Commonly known as: NORVASC Take 1 tablet (5 mg total) by mouth daily.   aspirin 81 MG EC tablet Take 81 mg by mouth daily.   famotidine 40 MG tablet Commonly known as: PEPCID Take 40 mg by mouth at bedtime.   furosemide 20 MG tablet Commonly known as: Lasix Take  1 tablet (20 mg total) by mouth daily as needed for fluid or edema.   oxyCODONE 5 MG immediate release tablet Commonly known as: Oxy IR/ROXICODONE Take 5 mg by mouth at bedtime.   thyroid 30 MG tablet Commonly known as: ARMOUR Take 30 mg by mouth daily before breakfast.        Follow-up Information    Idelle Crouch, MD. Schedule an appointment as soon as possible for a visit in 1 week(s).   Specialty: Internal Medicine Contact information: [place] [address] [phone]               Allergies  Allergen Reactions  . Codeine Anaphylaxis    Not Specified  . Brimonidine Tartrate Itching  . Fluoxetine Other (See Comments)    Reaction: Unknown  . Iodine   . Lodine [Etodolac]   . Morphine Itching  . Omeprazole Nausea And Vomiting  . Other Other (See Comments)  . Propoxyphene Other (See Comments)  . Brimonidine Itching and Other (See Comments)  . Ciprofloxacin Rash  . Gabapentin Rash     The results of significant diagnostics from this hospitalization (including imaging, microbiology, ancillary and laboratory) are listed below for reference.   Consultations:   Procedures/Studies: DG Chest 2 View  Result Date: 07/31/2020 CLINICAL DATA:  Pneumonia, acute CHF EXAM: CHEST - 2 VIEW COMPARISON:  07/30/2020 FINDINGS: Patchy bilateral lower lobe opacities, likely atelectasis, pneumonia not excluded. Mild interstitial edema. Small right and small to moderate left pleural effusions. No pneumothorax. Cardiomegaly. Right subclavian pacemaker. Thoracic aortic atherosclerosis. Lower thoracic vertebral augmentation. IMPRESSION: Cardiomegaly with mild interstitial edema. Small right and small to moderate left pleural effusions. Patchy bilateral lower lobe opacities, likely atelectasis, pneumonia not excluded. Electronically Signed   By: Julian Hy M.D.   On: 07/31/2020 17:48   CT Chest Wo Contrast  Result Date: 08/12/2020 CLINICAL DATA:  Respiratory illness.  Hypoxia. EXAM: CT CHEST WITHOUT CONTRAST TECHNIQUE: Multidetector CT imaging of the chest was performed following the standard protocol without IV contrast. COMPARISON:  X-ray earlier today.  CT chest 10/15/2013 FINDINGS: Cardiovascular: Heart size upper normal. No substantial pericardial effusion. Coronary artery calcification is evident. Atherosclerotic calcification is noted in the wall of the thoracic aorta. Right permanent  pacemaker noted. Mediastinum/Nodes: Upper normal lymph nodes are identified in the mediastinum measuring 10 mm short axis in the precarinal space and 11 mm short axis in the subcarinal station. No evidence for gross hilar lymphadenopathy although assessment is limited by the lack of intravenous contrast on today's study. Moderate hiatal hernia. The esophagus has normal imaging features. There is no axillary lymphadenopathy. Lungs/Pleura: Collapse/consolidative opacity is seen in both lower lobes, right greater than left with associated air bronchograms. Patchy ground-glass attenuation in the lung apices is slightly more prominent on the right. Nodular thickening noted in the right major fissure. Small to moderate bilateral pleural effusions. Upper Abdomen: Subtle micro nodular liver contour raises the question of cirrhosis. Musculoskeletal: No worrisome lytic or sclerotic osseous abnormality. Status post lower thoracic vertebral augmentation. Healed sternal fracture again noted. IMPRESSION: 1. Collapse/consolidative opacity in both lower lobes, right greater than left, with associated air bronchograms. Imaging features suspicious for bibasilar pneumonia. Given subtle nodularity of the right major fissure, follow-up recommended to ensure resolution. 2. Mild mediastinal lymphadenopathy, presumably reactive. Attention on follow-up recommended. 3. Small to moderate bilateral pleural effusions. 4. Subtle micro nodular liver contour raises the question of cirrhosis. 5. Moderate hiatal hernia. 6. Aortic  Atherosclerosis (ICD10-I70.0). Electronically Signed   By: Misty Stanley M.D.   On: 08/12/2020 12:21   DG Chest Portable 1 View  Result Date: 08/12/2020 CLINICAL DATA:  Shortness of breath and hypoxia. EXAM: PORTABLE CHEST 1 VIEW COMPARISON:  07/31/2020 FINDINGS: 1020 hours. The cardio pericardial silhouette is enlarged. Bibasilar airspace disease compatible with atelectasis or pneumonia is similar to prior. Small  bilateral pleural effusions again noted. Interstitial markings are diffusely coarsened with chronic features. Right permanent pacemaker remains in place. Bones are diffusely demineralized with lower thoracic thoracic augmentation. IMPRESSION: No substantial interval change. Bibasilar airspace disease and small bilateral pleural effusions. Electronically Signed   By: Misty Stanley M.D.   On: 08/12/2020 10:29   DG Chest Portable 1 View  Result Date: 07/30/2020 CLINICAL DATA:  Shortness of breath. EXAM: PORTABLE CHEST 1 VIEW COMPARISON:  October 02, 2016. FINDINGS: Stable cardiomediastinal silhouette. No pneumothorax or pleural effusion is noted. Right-sided pacemaker is unchanged in position. Increased interstitial opacities are noted throughout both lungs concerning for pneumonia or edema. Bony thorax is unremarkable. IMPRESSION: Increased bilateral interstitial opacities are noted concerning for pneumonia or edema. Aortic Atherosclerosis (ICD10-I70.0). Electronically Signed   By: Marijo Conception M.D.   On: 07/30/2020 08:49   ECHOCARDIOGRAM COMPLETE  Result Date: 07/30/2020    ECHOCARDIOGRAM REPORT   Patient Name:   Courtney Manning Date of Exam: 07/30/2020 Medical Rec #:  [id number]     Height:       64.0 in Accession #:    [id number]    Weight:       112.0 lb Date of Birth:  1928/11/12      BSA:          1.529 m Patient Age:    19 years      BP:           119/60 mmHg Patient Gender: F             HR:           56 bpm. Exam Location:  [facility] Procedure: 2D Echo, Cardiac Doppler and Color Doppler Indications:     CHF- acute diastolic 470.96  History:         Patient has no prior history of Echocardiogram examinations.                  Risk Factors:Hypertension. Anemia.  Sonographer:     Sherrie Sport RDCS (AE) Referring Phys:  [place] Diagnosing Phys: Ida Rogue MD IMPRESSIONS  1. Left ventricular ejection fraction, by estimation, is 55 to 60%. The left ventricle has normal function. The left ventricle  has no regional wall motion abnormalities. Left ventricular diastolic parameters are consistent with Grade I diastolic dysfunction (impaired relaxation).  2. Right ventricular systolic function is normal. The right ventricular size is normal. There is moderately elevated pulmonary artery systolic pressure. The estimated right ventricular systolic pressure is 28.3 mmHg.  3. Moderate to severe mitral valve regurgitation.  4. Tricuspid valve regurgitation is moderate.  5. Mild aortic valve stenosis.  6. Left atrial size was mildly dilated.  7. Left pleural effusion. FINDINGS  Left Ventricle: Left ventricular ejection fraction, by estimation, is 55 to 60%. The left ventricle has normal function. The left ventricle has no regional wall motion abnormalities. The left ventricular internal cavity size was normal in size. There is  no left ventricular hypertrophy. Left ventricular diastolic parameters are consistent with Grade I diastolic dysfunction (impaired relaxation). Right Ventricle: The right  ventricular size is normal. No increase in right ventricular wall thickness. Right ventricular systolic function is normal. There is moderately elevated pulmonary artery systolic pressure. The tricuspid regurgitant velocity is 3.41 m/s, and with an assumed right atrial pressure of 5 mmHg, the estimated right ventricular systolic pressure is 03.4 mmHg. Left Atrium: Left atrial size was mildly dilated. Right Atrium: Right atrial size was normal in size. Pericardium: There is no evidence of pericardial effusion. Mitral Valve: The mitral valve is normal in structure. Moderate to severe mitral valve regurgitation. No evidence of mitral valve stenosis. Tricuspid Valve: The tricuspid valve is normal in structure. Tricuspid valve regurgitation is moderate . No evidence of tricuspid stenosis. Aortic Valve: The aortic valve is normal in structure. Aortic valve regurgitation is not visualized. Mild aortic stenosis is present. Aortic valve  mean gradient measures 9.3 mmHg. Aortic valve peak gradient measures 17.0 mmHg. Aortic valve area, by VTI measures 1.21 cm. Pulmonic Valve: The pulmonic valve was normal in structure. Pulmonic valve regurgitation is not visualized. No evidence of pulmonic stenosis. Aorta: The aortic root is normal in size and structure. Venous: The inferior vena cava is normal in size with greater than 50% respiratory variability, suggesting right atrial pressure of 3 mmHg. IAS/Shunts: No atrial level shunt detected by color flow Doppler. Additional Comments: A pacer wire is visualized.  LEFT VENTRICLE PLAX 2D LVIDd:         3.88 cm  Diastology LVIDs:         2.49 cm  LV e' medial:  4.90 cm/s LV PW:         1.08 cm  LV e' lateral: 5.11 cm/s LV IVS:        1.06 cm LVOT diam:     2.00 cm LV SV:         46 LV SV Index:   30 LVOT Area:     3.14 cm  RIGHT VENTRICLE RV Basal diam:  3.32 cm RV S prime:     15.10 cm/s TAPSE (M-mode): 2.7 cm LEFT ATRIUM             Index       RIGHT ATRIUM           Index LA diam:        3.90 cm 2.55 cm/m  RA Area:     16.70 cm LA Vol (A2C):   64.4 ml 42.12 ml/m RA Volume:   42.70 ml  27.93 ml/m LA Vol (A4C):   59.9 ml 39.17 ml/m LA Biplane Vol: 63.4 ml 41.46 ml/m  AORTIC VALVE                    PULMONIC VALVE AV Area (Vmax):    1.10 cm     PV Vmax:        0.68 m/s AV Area (Vmean):   1.15 cm     PV Peak grad:   1.8 mmHg AV Area (VTI):     1.21 cm     RVOT Peak grad: 2 mmHg AV Vmax:           206.33 cm/s AV Vmean:          137.667 cm/s AV VTI:            0.384 m AV Peak Grad:      17.0 mmHg AV Mean Grad:      9.3 mmHg LVOT Vmax:         72.40 cm/s LVOT Vmean:  50.300 cm/s LVOT VTI:          0.148 m LVOT/AV VTI ratio: 0.39  AORTA Ao Root diam: 2.40 cm TRICUSPID VALVE TR Peak grad:   46.5 mmHg TR Vmax:        341.00 cm/s  SHUNTS Systemic VTI:  0.15 m Systemic Diam: 2.00 cm Ida Rogue MD Electronically signed by Ida Rogue MD Signature Date/Time: 07/30/2020/3:08:09 PM    Final        Labs: BNP (last 3 results) Recent Labs    07/30/20 0835  BNP 081.4*   Basic Metabolic Panel: Recent Labs  Lab 08/12/20 0946 08/13/20 0438 08/14/20 0452 08/15/20 0443 08/16/20 0403  NA 136 139 138 137 137  K 4.5 3.8 3.6 4.0 3.4*  CL 102 104 101 97* 98  CO2 22 24 25 28 27   GLUCOSE 95 95 94 91 97  BUN 37* 31* 30* 22 23  CREATININE 1.80* 1.63* 1.52* 1.44* 1.51*  CALCIUM 9.0 9.0 8.9 9.2 8.8*  MG  --   --  1.9 1.9 1.8   Liver Function Tests: Recent Labs  Lab 08/12/20 0946  AST 33  ALT 30  ALKPHOS 56  BILITOT 0.7  PROT 7.0  ALBUMIN 3.9   No results for input(s): LIPASE, AMYLASE in the last 168 hours. No results for input(s): AMMONIA in the last 168 hours. CBC: Recent Labs  Lab 08/12/20 0946 08/13/20 0438 08/14/20 0452 08/15/20 0443 08/16/20 0403  WBC 18.3* 11.3* 11.1* 10.8* 9.9  NEUTROABS 16.7*  --   --   --   --   HGB 10.6* 9.7* 9.8* 11.0* 11.0*  HCT 32.8* 29.7* 29.3* 32.6* 32.9*  MCV 91.6 90.3 89.1 88.8 89.2  PLT 339 299 295 315 305   Cardiac Enzymes: No results for input(s): CKTOTAL, CKMB, CKMBINDEX, TROPONINI in the last 168 hours. BNP: Invalid input(s): POCBNP CBG: Recent Labs  Lab 08/12/20 2014  GLUCAP 109*   D-Dimer No results for input(s): DDIMER in the last 72 hours. Hgb A1c No results for input(s): HGBA1C in the last 72 hours. Lipid Profile No results for input(s): CHOL, HDL, LDLCALC, TRIG, CHOLHDL, LDLDIRECT in the last 72 hours. Thyroid function studies No results for input(s): TSH, T4TOTAL, T3FREE, THYROIDAB in the last 72 hours.  Invalid input(s): FREET3 Anemia work up No results for input(s): VITAMINB12, FOLATE, FERRITIN, TIBC, IRON, RETICCTPCT in the last 72 hours. Urinalysis    Component Value Date/Time   COLORURINE YELLOW (A) 07/30/2020 0835   APPEARANCEUR HAZY (A) 07/30/2020 0835   APPEARANCEUR Cloudy 12/26/2013 1622   LABSPEC 1.013 07/30/2020 0835   LABSPEC 1.011 12/26/2013 1622   PHURINE 5.0 07/30/2020 0835    GLUCOSEU 150 (A) 07/30/2020 0835   GLUCOSEU Negative 12/26/2013 1622   HGBUR NEGATIVE 07/30/2020 0835   BILIRUBINUR NEGATIVE 07/30/2020 0835   BILIRUBINUR Negative 12/26/2013 1622   KETONESUR NEGATIVE 07/30/2020 0835   PROTEINUR NEGATIVE 07/30/2020 0835   NITRITE NEGATIVE 07/30/2020 0835   LEUKOCYTESUR NEGATIVE 07/30/2020 0835   LEUKOCYTESUR 3+ 12/26/2013 1622   Sepsis Labs Invalid input(s): PROCALCITONIN,  WBC,  LACTICIDVEN Microbiology Recent Results (from the past 240 hour(s))  Respiratory Panel by RT PCR (Flu A&B, Covid) - Nasopharyngeal Swab     Status: None   Collection Time: 08/12/20  9:46 AM   Specimen: Nasopharyngeal Swab  Result Value Ref Range Status   SARS Coronavirus 2 by RT PCR NEGATIVE NEGATIVE Final    Comment: (NOTE) SARS-CoV-2 target nucleic acids are NOT DETECTED.  The SARS-CoV-2 RNA  is generally detectable in upper respiratoy specimens during the acute phase of infection. The lowest concentration of SARS-CoV-2 viral copies this assay can detect is 131 copies/mL. A negative result does not preclude SARS-Cov-2 infection and should not be used as the sole basis for treatment or other patient management decisions. A negative result may occur with  improper specimen collection/handling, submission of specimen other than nasopharyngeal swab, presence of viral mutation(s) within the areas targeted by this assay, and inadequate number of viral copies (<131 copies/mL). A negative result must be combined with clinical observations, patient history, and epidemiological information. The expected result is Negative.  Fact Sheet for Patients:  PinkCheek.be  Fact Sheet for Healthcare Providers:  GravelBags.it  This test is no t yet approved or cleared by the [place] FDA and  has been authorized for detection and/or diagnosis of SARS-CoV-2 by FDA under an Emergency Use Authorization (EUA). This EUA will  remain  in effect (meaning this test can be used) for the duration of the COVID-19 declaration under Section 564(b)(1) of the Act, 21 U.S.C. section 360bbb-3(b)(1), unless the authorization is terminated or revoked sooner.     Influenza A by PCR NEGATIVE NEGATIVE Final   Influenza B by PCR NEGATIVE NEGATIVE Final    Comment: (NOTE) The Xpert Xpress SARS-CoV-2/FLU/RSV assay is intended as an aid in  the diagnosis of influenza from Nasopharyngeal swab specimens and  should not be used as a sole basis for treatment. Nasal washings and  aspirates are unacceptable for Xpert Xpress SARS-CoV-2/FLU/RSV  testing.  Fact Sheet for Patients: PinkCheek.be  Fact Sheet for Healthcare Providers: GravelBags.it  This test is not yet approved or cleared by the [place] FDA and  has been authorized for detection and/or diagnosis of SARS-CoV-2 by  FDA under an Emergency Use Authorization (EUA). This EUA will remain  in effect (meaning this test can be used) for the duration of the  Covid-19 declaration under Section 564(b)(1) of the Act, 21  U.S.C. section 360bbb-3(b)(1), unless the authorization is  terminated or revoked. Performed at [facility], [address] [place], [place] [address]   Culture, blood (routine x 2)     Status: None (Preliminary result)   Collection Time: 08/12/20  9:46 AM   Specimen: BLOOD  Result Value Ref Range Status   Specimen Description BLOOD LEFT ANTECUBITAL  Final   Special Requests   Final    BOTTLES DRAWN AEROBIC AND ANAEROBIC Blood Culture results may not be optimal due to an excessive volume of blood received in culture bottles   Culture   Final    NO GROWTH 4 DAYS Performed at [facility], [address] [place], [place] [address]    Report Status PENDING  Incomplete  Culture, blood (routine x 2)     Status: None (Preliminary result)   Collection Time: 08/12/20 10:09 AM    Specimen: BLOOD  Result Value Ref Range Status   Specimen Description BLOOD BLOOD RIGHT FOREARM  Final   Special Requests   Final    BOTTLES DRAWN AEROBIC AND ANAEROBIC Blood Culture adequate volume   Culture   Final    NO GROWTH 4 DAYS Performed at [facility], [address], [place], [place] [address]    Report Status PENDING  Incomplete     Total time spend on discharging this patient, including the last patient exam, discussing the hospital stay, instructions for ongoing care as it relates to all pertinent caregivers, as well as preparing  the medical discharge records, prescriptions, and/or referrals as applicable, is 35 minutes.    Enzo Bi, MD  [facility] 08/16/2020, 9:48 AM  If 7PM-7AM, please contact night-coverage

## 2020-08-17 LAB — CULTURE, BLOOD (ROUTINE X 2)
Culture: NO GROWTH
Culture: NO GROWTH
Special Requests: ADEQUATE

## 2020-09-02 ENCOUNTER — Emergency Department: Payer: Medicare Other

## 2020-09-02 ENCOUNTER — Other Ambulatory Visit: Payer: Self-pay

## 2020-09-02 ENCOUNTER — Inpatient Hospital Stay: Payer: Medicare Other

## 2020-09-02 ENCOUNTER — Inpatient Hospital Stay
Admission: EM | Admit: 2020-09-02 | Discharge: 2020-09-04 | DRG: 291 | Disposition: A | Discharge status: Skilled Nursing Facility | Payer: Medicare Other | Attending: Internal Medicine | Admitting: Internal Medicine

## 2020-09-02 DIAGNOSIS — Z885 Allergy status to narcotic agent status: Secondary | ICD-10-CM | POA: Diagnosis not present

## 2020-09-02 DIAGNOSIS — R778 Other specified abnormalities of plasma proteins: Secondary | ICD-10-CM | POA: Diagnosis not present

## 2020-09-02 DIAGNOSIS — Z8542 Personal history of malignant neoplasm of other parts of uterus: Secondary | ICD-10-CM

## 2020-09-02 DIAGNOSIS — H409 Unspecified glaucoma: Secondary | ICD-10-CM | POA: Diagnosis present

## 2020-09-02 DIAGNOSIS — Z66 Do not resuscitate: Secondary | ICD-10-CM | POA: Diagnosis present

## 2020-09-02 DIAGNOSIS — I10 Essential (primary) hypertension: Secondary | ICD-10-CM | POA: Diagnosis not present

## 2020-09-02 DIAGNOSIS — I130 Hypertensive heart and chronic kidney disease with heart failure and stage 1 through stage 4 chronic kidney disease, or unspecified chronic kidney disease: Secondary | ICD-10-CM | POA: Diagnosis not present

## 2020-09-02 DIAGNOSIS — Z888 Allergy status to other drugs, medicaments and biological substances status: Secondary | ICD-10-CM | POA: Diagnosis not present

## 2020-09-02 DIAGNOSIS — Z833 Family history of diabetes mellitus: Secondary | ICD-10-CM

## 2020-09-02 DIAGNOSIS — M79669 Pain in unspecified lower leg: Secondary | ICD-10-CM

## 2020-09-02 DIAGNOSIS — Z7982 Long term (current) use of aspirin: Secondary | ICD-10-CM | POA: Diagnosis not present

## 2020-09-02 DIAGNOSIS — Z9109 Other allergy status, other than to drugs and biological substances: Secondary | ICD-10-CM | POA: Diagnosis not present

## 2020-09-02 DIAGNOSIS — K219 Gastro-esophageal reflux disease without esophagitis: Secondary | ICD-10-CM | POA: Diagnosis present

## 2020-09-02 DIAGNOSIS — N179 Acute kidney failure, unspecified: Secondary | ICD-10-CM | POA: Diagnosis present

## 2020-09-02 DIAGNOSIS — Z8249 Family history of ischemic heart disease and other diseases of the circulatory system: Secondary | ICD-10-CM

## 2020-09-02 DIAGNOSIS — N184 Chronic kidney disease, stage 4 (severe): Secondary | ICD-10-CM | POA: Diagnosis present

## 2020-09-02 DIAGNOSIS — I248 Other forms of acute ischemic heart disease: Secondary | ICD-10-CM | POA: Diagnosis present

## 2020-09-02 DIAGNOSIS — I509 Heart failure, unspecified: Secondary | ICD-10-CM

## 2020-09-02 DIAGNOSIS — Z681 Body mass index (BMI) 19 or less, adult: Secondary | ICD-10-CM

## 2020-09-02 DIAGNOSIS — Z950 Presence of cardiac pacemaker: Secondary | ICD-10-CM | POA: Diagnosis not present

## 2020-09-02 DIAGNOSIS — Z881 Allergy status to other antibiotic agents status: Secondary | ICD-10-CM | POA: Diagnosis not present

## 2020-09-02 DIAGNOSIS — I5033 Acute on chronic diastolic (congestive) heart failure: Secondary | ICD-10-CM | POA: Diagnosis present

## 2020-09-02 DIAGNOSIS — D649 Anemia, unspecified: Secondary | ICD-10-CM | POA: Diagnosis present

## 2020-09-02 DIAGNOSIS — Z79899 Other long term (current) drug therapy: Secondary | ICD-10-CM | POA: Diagnosis not present

## 2020-09-02 DIAGNOSIS — E039 Hypothyroidism, unspecified: Secondary | ICD-10-CM | POA: Diagnosis present

## 2020-09-02 DIAGNOSIS — Z96649 Presence of unspecified artificial hip joint: Secondary | ICD-10-CM | POA: Diagnosis present

## 2020-09-02 DIAGNOSIS — J9601 Acute respiratory failure with hypoxia: Secondary | ICD-10-CM

## 2020-09-02 DIAGNOSIS — K5900 Constipation, unspecified: Secondary | ICD-10-CM | POA: Diagnosis present

## 2020-09-02 DIAGNOSIS — R0603 Acute respiratory distress: Secondary | ICD-10-CM

## 2020-09-02 DIAGNOSIS — E440 Moderate protein-calorie malnutrition: Secondary | ICD-10-CM | POA: Diagnosis present

## 2020-09-02 DIAGNOSIS — Z79890 Hormone replacement therapy (postmenopausal): Secondary | ICD-10-CM

## 2020-09-02 DIAGNOSIS — Z20822 Contact with and (suspected) exposure to covid-19: Secondary | ICD-10-CM | POA: Diagnosis present

## 2020-09-02 LAB — BLOOD CULTURE ID PANEL (REFLEXED) - BCID2

## 2020-09-02 LAB — CBC WITH DIFFERENTIAL/PLATELET
Abs Immature Granulocytes: 0.03 10*3/uL (ref 0.00–0.07)
Basophils Absolute: 0.1 10*3/uL (ref 0.0–0.1)
Basophils Relative: 1 %
Eosinophils Absolute: 0.6 10*3/uL — ABNORMAL HIGH (ref 0.0–0.5)
Eosinophils Relative: 7 %
HCT: 38.8 % (ref 36.0–46.0)
Hemoglobin: 12.2 g/dL (ref 12.0–15.0)
Immature Granulocytes: 0 %
Lymphocytes Relative: 19 %
Lymphs Abs: 1.7 10*3/uL (ref 0.7–4.0)
MCH: 29.7 pg (ref 26.0–34.0)
MCHC: 31.4 g/dL (ref 30.0–36.0)
MCV: 94.4 fL (ref 80.0–100.0)
Monocytes Absolute: 0.6 10*3/uL (ref 0.1–1.0)
Monocytes Relative: 6 %
Neutro Abs: 5.7 10*3/uL (ref 1.7–7.7)
Neutrophils Relative %: 67 %
Platelets: 302 10*3/uL (ref 150–400)
RBC: 4.11 MIL/uL (ref 3.87–5.11)
RDW: 13.7 % (ref 11.5–15.5)
WBC: 8.6 10*3/uL (ref 4.0–10.5)
nRBC: 0 % (ref 0.0–0.2)

## 2020-09-02 LAB — COMPREHENSIVE METABOLIC PANEL
ALT: 20 U/L (ref 0–44)
AST: 34 U/L (ref 15–41)
Albumin: 4.2 g/dL (ref 3.5–5.0)
Alkaline Phosphatase: 62 U/L (ref 38–126)
Anion gap: 9 (ref 5–15)
BUN: 27 mg/dL — ABNORMAL HIGH (ref 8–23)
CO2: 22 mmol/L (ref 22–32)
Calcium: 9 mg/dL (ref 8.9–10.3)
Chloride: 104 mmol/L (ref 98–111)
Creatinine, Ser: 1.96 mg/dL — ABNORMAL HIGH (ref 0.44–1.00)
GFR, Estimated: 24 mL/min — ABNORMAL LOW (ref >60)
Glucose, Bld: 327 mg/dL — ABNORMAL HIGH (ref 70–99)
Potassium: 5 mmol/L (ref 3.5–5.1)
Sodium: 135 mmol/L (ref 135–145)
Total Bilirubin: 1 mg/dL (ref 0.3–1.2)
Total Protein: 7.5 g/dL (ref 6.5–8.1)

## 2020-09-02 LAB — BLOOD GAS, ARTERIAL
Acid-base deficit: 4.3 mmol/L — ABNORMAL HIGH (ref 0.0–2.0)
Bicarbonate: 22.1 mmol/L (ref 20.0–28.0)
Delivery systems: POSITIVE
Expiratory PAP: 7
FIO2: 1
Inspiratory PAP: 14
O2 Saturation: 96.8 %
Patient temperature: 37
pCO2 arterial: 45 mmHg (ref 32.0–48.0)
pH, Arterial: 7.3 — ABNORMAL LOW (ref 7.350–7.450)
pO2, Arterial: 97 mmHg (ref 83.0–108.0)

## 2020-09-02 LAB — TROPONIN I (HIGH SENSITIVITY)
Troponin I (High Sensitivity): 15 ng/L (ref <18)
Troponin I (High Sensitivity): 18 ng/L — ABNORMAL HIGH (ref <18)
Troponin I (High Sensitivity): 24 ng/L — ABNORMAL HIGH (ref <18)
Troponin I (High Sensitivity): 25 ng/L — ABNORMAL HIGH (ref <18)

## 2020-09-02 LAB — LACTIC ACID, PLASMA
Lactic Acid, Venous: 2.4 mmol/L (ref 0.5–1.9)
Lactic Acid, Venous: 2 mmol/L (ref 0.5–1.9)

## 2020-09-02 LAB — RESPIRATORY PANEL BY RT PCR (FLU A&B, COVID)
Influenza A by PCR: NEGATIVE
Influenza B by PCR: NEGATIVE
SARS Coronavirus 2 by RT PCR: NEGATIVE

## 2020-09-02 LAB — BRAIN NATRIURETIC PEPTIDE: B Natriuretic Peptide: 693.1 pg/mL — ABNORMAL HIGH (ref 0.0–100.0)

## 2020-09-02 LAB — CBG MONITORING, ED: Glucose-Capillary: 80 mg/dL (ref 70–99)

## 2020-09-02 LAB — PROCALCITONIN: Procalcitonin: <0.1 ng/mL

## 2020-09-02 MED ORDER — SODIUM CHLORIDE 0.9 % IV SOLN: 250.0000 mL | INTRAVENOUS | Status: DC | PRN

## 2020-09-02 MED ORDER — FUROSEMIDE 10 MG/ML IJ SOLN
20.0000 mg | Freq: Every day | INTRAMUSCULAR | Status: DC
  Administered 2020-09-03 – 2020-09-04 (×2): 20 mg via INTRAVENOUS
  Filled 2020-09-02 (×2): qty 2

## 2020-09-02 MED ORDER — ASPIRIN EC 81 MG PO TBEC
81.0000 mg | DELAYED_RELEASE_TABLET | Freq: Every day | ORAL | Status: DC
  Administered 2020-09-02 – 2020-09-04 (×3): 81 mg via ORAL
  Filled 2020-09-02 (×3): qty 1

## 2020-09-02 MED ORDER — ACETAMINOPHEN 325 MG PO TABS
650.0000 mg | ORAL_TABLET | Freq: Four times a day (QID) | ORAL | Status: DC | PRN
  Administered 2020-09-02: 650 mg via ORAL
  Filled 2020-09-02: qty 2

## 2020-09-02 MED ORDER — SODIUM CHLORIDE 0.9% FLUSH
3.0000 mL | Freq: Two times a day (BID) | INTRAVENOUS | Status: DC
  Administered 2020-09-02 – 2020-09-04 (×4): 3 mL via INTRAVENOUS

## 2020-09-02 MED ORDER — SODIUM CHLORIDE 0.9% FLUSH: 3.0000 mL | INTRAVENOUS | Status: DC | PRN

## 2020-09-02 MED ORDER — FUROSEMIDE 10 MG/ML IJ SOLN
20.0000 mg | Freq: Once | INTRAMUSCULAR | Status: AC
  Administered 2020-09-02: 20 mg via INTRAVENOUS
  Filled 2020-09-02: qty 4

## 2020-09-02 MED ORDER — DM-GUAIFENESIN ER 30-600 MG PO TB12: 1.0000 | ORAL_TABLET | Freq: Two times a day (BID) | ORAL | Status: DC | PRN

## 2020-09-02 MED ORDER — HYDRALAZINE HCL 20 MG/ML IJ SOLN: 5.0000 mg | INTRAMUSCULAR | Status: DC | PRN

## 2020-09-02 MED ORDER — ONDANSETRON HCL 4 MG/2ML IJ SOLN: 4.0000 mg | Freq: Three times a day (TID) | INTRAMUSCULAR | Status: DC | PRN

## 2020-09-02 MED ORDER — IPRATROPIUM BROMIDE 0.02 % IN SOLN: 0.5000 mg | RESPIRATORY_TRACT | Status: DC | PRN

## 2020-09-02 MED ORDER — AMLODIPINE BESYLATE 5 MG PO TABS
5.0000 mg | ORAL_TABLET | Freq: Every day | ORAL | Status: DC
  Administered 2020-09-02 – 2020-09-04 (×3): 5 mg via ORAL
  Filled 2020-09-02 (×3): qty 1

## 2020-09-02 MED ORDER — HEPARIN SODIUM (PORCINE) 5000 UNIT/ML IJ SOLN
5000.0000 [IU] | Freq: Three times a day (TID) | INTRAMUSCULAR | Status: DC
  Administered 2020-09-02 – 2020-09-04 (×6): 5000 [IU] via SUBCUTANEOUS
  Filled 2020-09-02 (×6): qty 1

## 2020-09-02 MED ORDER — TIMOLOL MALEATE 0.25 % OP SOLN
1.0000 [drp] | Freq: Two times a day (BID) | OPHTHALMIC | Status: DC
  Administered 2020-09-02 – 2020-09-04 (×5): 1 [drp] via OPHTHALMIC
  Filled 2020-09-02 (×2): qty 5

## 2020-09-02 MED ORDER — FAMOTIDINE 20 MG PO TABS
40.0000 mg | ORAL_TABLET | Freq: Every day | ORAL | Status: DC
  Administered 2020-09-02: 40 mg via ORAL
  Filled 2020-09-02: qty 2

## 2020-09-02 MED ORDER — THYROID 30 MG PO TABS
30.0000 mg | ORAL_TABLET | Freq: Every day | ORAL | Status: DC
  Administered 2020-09-02 – 2020-09-04 (×3): 30 mg via ORAL
  Filled 2020-09-02 (×4): qty 1

## 2020-09-02 MED ORDER — OXYCODONE HCL 5 MG PO TABS
5.0000 mg | ORAL_TABLET | Freq: Every day | ORAL | Status: DC
  Administered 2020-09-02 – 2020-09-03 (×2): 5 mg via ORAL
  Filled 2020-09-02 (×2): qty 1

## 2020-09-02 NOTE — Evaluation (Signed)
Occupational Therapy Evaluation Patient Details Name: Courtney Manning MRN: [id number] DOB: May 18, 1929 86 y.o. female brought to the ED via EMS from home with a chief complaint of respiratory distress.    History of Present Illness 86 y.o. female brought to the ED via EMS from home with a chief complaint of respiratory distress.  Patient with a history of CHF not on home oxygen, CKD, hypertension, GERD whose husband died last evening.  Overnight patient became progressively short of breath.  EMS reports room air saturations upon their arrival in the 50%'s.  Arrives to the ED on CPAP.  Given 1 albuterol and 1 DuoNeb en route to the ED.  EMS reports family initiated paperwork for DNR/DNI yesterday.   Clinical Impression   Upon entering the room,pt supine in bed with no c/o pain. Pt is HOH but very pleasant. A &O x4. Pt performing bed mobility at mod I level and performing self care tasks with supervision for safety awareness. Pt reports her husband passed away before being brought to ED and she is concerned about returning home soon for the funeral. Pt reports being mod I with use of RW and living with family who are present 24/7. Pt has all needed equipment. No further OT intervention needed at this time. OT will SIGN OFF.     Follow Up Recommendations  No OT follow up;Supervision - Intermittent    Equipment Recommendations  None recommended by OT       Precautions / Restrictions Precautions Precautions: Fall Precaution Comments: monitor O2 sats      Mobility Bed Mobility Overal bed mobility: Modified Independent Bed Mobility: Supine to Sit;Sit to Supine;Rolling Rolling: Independent   Supine to sit: Modified independent (Device/Increase time) Sit to supine: Modified independent (Device/Increase time)   General bed mobility comments: no assistance needed and pt showing good safety awareness/technique    Transfers Overall transfer level: Needs assistance Equipment used: None Transfers: Sit to/from Stand Sit to Stand: Supervision        Balance Overall  balance assessment: Needs assistance Sitting-balance support: Feet supported Sitting balance-Leahy Scale: Good     Standing balance support: Bilateral upper extremity supported;During functional activity Standing balance-Leahy Scale: Fair          ADL either performed or assessed with clinical judgement   ADL Overall ADL's : Needs assistance/impaired Eating/Feeding: Modified independent   Grooming: Wash/dry hands;Wash/dry face;Oral care;Modified independent   Upper Body Bathing: Modified independent   Lower Body Bathing: Supervison/ safety;Sit to/from stand   Upper Body Dressing : Modified independent;Sitting   Lower Body Dressing: Supervision/safety;Sit to/from stand   Toilet Transfer: Supervision/safety;RW   Toileting- Water quality scientist and Hygiene: Supervision/safety;Sit to/from stand        Vision Patient Visual Report: No change from baseline              Pertinent Vitals/Pain Pain Assessment: No/denies pain     Hand Dominance Right   Extremity/Trunk Assessment Upper Extremity Assessment Upper Extremity Assessment: Overall WFL for tasks assessed   Lower Extremity Assessment Lower Extremity Assessment: Defer to PT evaluation   Cervical / Trunk Assessment Cervical / Trunk Assessment: Kyphotic   Communication Communication Communication: HOH   Cognition Arousal/Alertness: Awake/alert Behavior During Therapy: WFL for tasks assessed/performed Overall Cognitive Status: Within Functional Limits for tasks assessed                     Home Living Family/patient expects to be discharged to:: Private residence Living Arrangements: Children;Other (Comment) (lives with son and other family members. Pt's husband recently passes at time of this admission)  Available Help at Discharge: Family;Available 24 hours/day Type of Home: House Home Access: Stairs to enter [employer] of Steps: 2 Entrance Stairs-Rails: Right;Left;Can reach  both Home Layout: One level     Bathroom Shower/Tub: Occupational psychologist: Standard     Home Equipment: Environmental consultant - 2 wheels;Cane - single point;Grab bars - toilet;Shower seat;Bedside commode;Wheelchair - manual   Additional Comments: Pt does not use wheelchair ot 3 in1 but has from spouse      Prior Functioning/Environment Level of Independence: Independent with assistive device(s)  Gait / Transfers Assistance Needed: Pt uses RW for functional transfers/mobility. ADL's / Homemaking Assistance Needed: family help with cooking and cleaning but pt reports mod I with self care tasks and bathing while seated on shower chair                     OT Goals(Current goals can be found in the care plan section) Acute Rehab OT Goals Patient Stated Goal: to go home and feel better OT Goal Formulation: With patient Time For Goal Achievement: 09/16/20 Potential to Achieve Goals: Good  OT Frequency:      AM-PAC OT "6 Clicks" Daily Activity     Outcome Measure Help from another person eating meals?: None Help from another person taking care of personal grooming?: None Help from another person toileting, which includes using toliet, bedpan, or urinal?: None Help from another person bathing (including washing, rinsing, drying)?: None Help from another person to put on and taking off regular upper body clothing?: None Help from another person to put on and taking off regular lower body clothing?: None 6 Click Score: 24   End of Session    Activity Tolerance: Patient tolerated treatment well Patient left: in bed;with call bell/phone within reach                   Time: 1430-1445 OT Time Calculation (min): 15 min Charges:  OT General Charges $OT Visit: 1 Visit OT Evaluation $OT Eval Low Complexity: 1 Low OT Treatments $Self Care/Home Management : 8-22 mins  Darleen Crocker, MS, OTR/L , CBIS ascom [phone]  09/02/20, 3:00 PM

## 2020-09-02 NOTE — Evaluation (Signed)
Physical Therapy Evaluation Patient Details Name: Courtney Manning MRN: [id number] DOB: [birth year] Today's Date: 09/02/2020   History of Present Illness  Pt is an 86 y.o. female brought to the ED via EMS from home with a chief complaint of respiratory distress.  Patient with a history of CHF not on home oxygen, CKD, hypertension, GERD whose husband died last evening.  Overnight patient became progressively short of breath.  EMS reports room air saturations upon their arrival in the 50%'s.  MD assessment includes: Acute on chronic diastolic CHF, elevated troponin likely due to demand ischemia, hypothyroidism, HTN, and acute on chronic renal failure.    Clinical Impression  Pt was pleasant and motivated to participate during the session.  Pt required no physical assistance with any functional task and was steady throughout.  Pt was Ind with bed mobility and transfers and was given SBA during amb but was steady and safe throughout.   Pt was able to perform multiple standing tasks requiring reaching outside of BOS without UE support with good stability throughout.  Pt's SpO2 remained in the low 90s during the session with HR WNL.  Pt reported no adverse symptoms during the session other than general fatigue.  No skilled PT needs identified at this time.  Will complete PT orders at this time but will reassess pt pending a change in status upon receipt of new PT orders.       Follow Up Recommendations No PT follow up    Equipment Recommendations  None recommended by PT    Recommendations for Other Services       Precautions / Restrictions Precautions Precautions: Fall Precaution Comments: monitor O2 sats Restrictions Weight Bearing Restrictions: No      Mobility  Bed Mobility Overal bed mobility: Modified Independent Bed Mobility: Supine to Sit;Sit to Supine;Rolling Rolling: Independent   Supine to sit: Modified independent (Device/Increase time) Sit to supine: Modified independent  (Device/Increase time)   General bed mobility comments: Min increased time with bed mobility tasks but did not require the use of a rail    Transfers Overall transfer level: Independent Equipment used: None Transfers: Sit to/from Stand Sit to Stand: Supervision         General transfer comment: Pt able to perform multiple sit to stands from the EOB without an AD with good control and stability  Ambulation/Gait Ambulation/Gait assistance: Supervision Gait Distance (Feet): 30 Feet Assistive device: Rolling walker (2 wheeled);None Gait Pattern/deviations: Step-through pattern;Decreased step length - right;Decreased step length - left Gait velocity: min decreased   General Gait Details: Pt able to amb 30 in room with a combination of a RW and no AD with good stability throughout  Stairs            Wheelchair Mobility    Modified Rankin (Stroke Patients Only)       Balance Overall balance assessment: No apparent balance deficits (not formally assessed) Sitting-balance support: Feet supported Sitting balance-Leahy Scale: Good     Standing balance support: Bilateral upper extremity supported;During functional activity Standing balance-Leahy Scale: Fair                               Pertinent Vitals/Pain Pain Assessment: No/denies pain    Home Living Family/patient expects to be discharged to:: Private residence Living Arrangements: Children Available Help at Discharge: Family;Available 24 hours/day Type of Home: House Home Access: Stairs to enter Entrance Stairs-Rails: Right;Left;Can reach both Entrance Stairs-Number of  Steps: 2 Home Layout: One level Home Equipment: Walker - 2 wheels;Cane - single point;Grab bars - toilet;Shower seat;Bedside commode;Wheelchair - manual Additional Comments: Pt does not use wheelchair ot 3 in1 but has from spouse    Prior Function Level of Independence: Independent with assistive device(s)   Gait / Transfers  Assistance Needed: Mod Ind amb limited community distances with a RW, no fall history, w/c for [place] distances, Ind with ADLs  ADL's / Homemaking Assistance Needed: family help with cooking and cleaning but pt reports mod I with self care tasks and bathing while seated on shower chair        Hand Dominance   Dominant Hand: Right    Extremity/Trunk Assessment   Upper Extremity Assessment Upper Extremity Assessment: Overall WFL for tasks assessed    Lower Extremity Assessment Lower Extremity Assessment: Overall WFL for tasks assessed    Cervical / Trunk Assessment Cervical / Trunk Assessment: Kyphotic  Communication   Communication: HOH  Cognition Arousal/Alertness: Awake/alert Behavior During Therapy: WFL for tasks assessed/performed Overall Cognitive Status: No family/caregiver present to determine baseline cognitive functioning                                 General Comments: Pt HOH with some issues providing details regarding hiistory/PLOF, daughter Malachy Mood contacted by phone and assisted with verifying/providing information      General Comments      Exercises     Assessment/Plan    PT Assessment Patent does not need any further PT services  PT Problem List         PT Treatment Interventions      PT Goals (Current goals can be found in the Care Plan section)  Acute Rehab PT Goals Patient Stated Goal: to go home and feel better PT Goal Formulation: All assessment and education complete, DC therapy    Frequency     Barriers to discharge        Co-evaluation               AM-PAC PT "6 Clicks" Mobility  Outcome Measure Help needed turning from your back to your side while in a flat bed without using bedrails?: None Help needed moving from lying on your back to sitting on the side of a flat bed without using bedrails?: None Help needed moving to and from a bed to a chair (including a wheelchair)?: None Help needed standing  up from a chair using your arms (e.g., wheelchair or bedside chair)?: None Help needed to walk in hospital room?: None Help needed climbing 3-5 steps with a railing? : None 6 Click Score: 24    End of Session Equipment Utilized During Treatment: Gait belt;Oxygen Activity Tolerance: Patient tolerated treatment well Patient left: in bed;with call bell/phone within reach;Other (comment) (Bilateral rails up in ED as pt was found) Nurse Communication: Mobility status PT Visit Diagnosis: Muscle weakness (generalized) (M62.81)    Time: 1610-9604 PT Time Calculation (min) (ACUTE ONLY): 38 min   Charges:   PT Evaluation $PT Eval Low Complexity: 1 Low          D. Royetta Asal PT, DPT 09/02/20, 3:31 PM

## 2020-09-02 NOTE — ED Notes (Signed)
Pt with O2 sats dropping to 89% on 3 L [place] while pt is talking. Pt anxious, pt given verbal reassurance. O 2 increased to 4 L [place]

## 2020-09-02 NOTE — ED Notes (Signed)
Date and time results received: 09/02/20 5:16 AM  (use smartphrase ".now" to insert current time)  Test: lactic Critical Value: 2.4  Name of Provider Notified: sung  Orders Received? Or Actions Taken?: n/a

## 2020-09-02 NOTE — Plan of Care (Signed)

## 2020-09-02 NOTE — H&P (Signed)
History and Physical    Courtney Manning DOB: February 07, 1929 DOA: 09/02/2020  Referring MD/NP/PA:   PCP: Idelle Crouch, MD   Patient coming from:  The patient is coming from home.  At baseline, pt is partially dependent for most of ADL.        Chief Complaint: SOB  HPI: Courtney Manning is a 86 y.o. female with medical history significant of hypertension, dCHF, CKD stage IV, uterine cancer, anemia, GERD, hypothyroidism, who presents with shortness of breath.  Patient was recently hospitalized from 10/14-10/18 due to CAP and CHF exacerbation. Patient states that she developed worsening shortness of breath in the past several days, which has been progressively worsening. Patient denies chest pain, cough, fever or chills. No nausea vomiting, diarrhea or abdominal pain, no symptoms of UTI. Patient states that she has left lower leg pain. No recent fall. Patient was found to have oxygen desaturation to 49% on room air, BiPAP is started in ED. Per report, pt's husband passed away last evening. EMS reports family initiated paperwork for DNR/DNI yesterday.    ED Course: pt was found to have trop 15 -->18, BNP 693, lactic acid 2.4, troponin 0, negative Covid PCR, worsening renal function, temperature normal, blood pressure 128/99, heart rate 69, RR 24, chest x-ray showed pulmonary edema with small bilateral pleural effusion. Patient is admitted to progressive bed as inpatient.  Review of Systems:   General: no fevers, chills, no body weight gain, has poor appetite, has fatigue HEENT: no blurry vision, hearing changes or sore throat Respiratory: has dyspnea, no coughing, wheezing CV: no chest pain, no palpitations GI: no nausea, vomiting, abdominal pain, diarrhea, constipation GU: no dysuria, burning on urination, increased urinary frequency, hematuria  Ext: has mild leg edema Neuro: no unilateral weakness, numbness, or tingling, no vision change or hearing loss Skin: no rash, no skin  tear. MSK: No muscle spasm, no deformity, no limitation of range of movement in spin. Has left lower leg pain. Heme: No easy bruising.  Travel history: No recent long distant travel.  Allergy:  Allergies  Allergen Reactions  . Codeine Anaphylaxis    Not Specified  . Brimonidine Tartrate Itching  . Fluoxetine Other (See Comments)    Reaction: Unknown  . Iodine   . Lodine [Etodolac]   . Morphine Itching  . Omeprazole Nausea And Vomiting  . Other Other (See Comments)  . Propoxyphene Other (See Comments)  . Brimonidine Itching and Other (See Comments)  . Ciprofloxacin Rash  . Gabapentin Rash    Past Medical History:  Diagnosis Date  . Anemia   . Cervical cancer ([place])    Uterine Cancer  . CHF (congestive heart failure) ([place])   . Chronic kidney disease   . Constipation   . GERD (gastroesophageal reflux disease)   . Glaucoma   . Hypertension   . Hypothyroidism   . Insomnia   . Spinal stenosis   . Thyroid disease     Past Surgical History:  Procedure Laterality Date  . ABDOMINAL HYSTERECTOMY    . BACK SURGERY    . COLONOSCOPY WITH PROPOFOL N/A 09/07/2016   Procedure: COLONOSCOPY WITH PROPOFOL;  Surgeon: Lollie Sails, MD;  Location: [facility] ENDOSCOPY;  Service: Endoscopy;  Laterality: N/A;  . ESOPHAGOGASTRODUODENOSCOPY (EGD) WITH PROPOFOL N/A 09/07/2016   Procedure: ESOPHAGOGASTRODUODENOSCOPY (EGD) WITH PROPOFOL;  Surgeon: Lollie Sails, MD;  Location: [facility] ENDOSCOPY;  Service: Endoscopy;  Laterality: N/A;  . HIP SURGERY    . JOINT REPLACEMENT  Hip Replacement  . KYPHOPLASTY N/A 06/17/2015   Procedure: KYPHOPLASTY;  Surgeon: Hessie Knows, MD;  Location: [facility] ORS;  Service: Orthopedics;  Laterality: N/A;  . PACEMAKER INSERTION    . SPINE SURGERY      Social History:  reports that she has never smoked. She has never used smokeless tobacco. She reports that she does not drink alcohol and does not use drugs.  Family History:  Family History  Problem Relation Age  of Onset  . Diabetes Mellitus II Mother   . Diabetes Mellitus II Father   . Hypertension Father      Prior to Admission medications   Medication Sig Start Date End Date Taking? Authorizing Provider  amLODipine (NORVASC) 5 MG tablet Take 1 tablet (5 mg total) by mouth daily. 08/03/20  Yes Lorella Nimrod, MD  aspirin 81 MG EC tablet Take 81 mg by mouth daily.    Yes [provider]  famotidine (PEPCID) 40 MG tablet Take 40 mg by mouth at bedtime. 05/04/20  Yes [provider]  furosemide (LASIX) 20 MG tablet Take 1 tablet (20 mg total) by mouth daily as needed for fluid or edema. 08/02/20 08/02/21 Yes Lorella Nimrod, MD  oxyCODONE (OXY IR/ROXICODONE) 5 MG immediate release tablet Take 5 mg by mouth at bedtime.  08/01/15  Yes [provider]  thyroid (ARMOUR) 30 MG tablet Take 30 mg by mouth daily before breakfast.  03/07/17  Yes [provider]  timolol (TIMOPTIC) 0.25 % ophthalmic solution Place 1 drop into both eyes 2 (two) times daily. 07/20/20  Yes [provider]    Physical Exam: Vitals:   09/02/20 9147 09/02/20 0756 09/02/20 0807 09/02/20 0959  BP: (!) 128/99     Pulse: 69     Resp: 19     Temp:      TempSrc:      SpO2: 100% 100% 100% 95%  Weight:      Height:       General: Not in acute distress HEENT:       Eyes: PERRL, EOMI, no scleral icterus.       ENT: No discharge from the ears and nose, no pharynx injection, no tonsillar enlargement.        Neck: postive JVD, no bruit, no mass felt. Heme: No neck lymph node enlargement. Cardiac: S1/S2, RRR, No murmurs, No gallops or rubs. Respiratory: Has fine crackles bilaterally GI: Soft, nondistended, nontender, no rebound pain, no organomegaly, BS present. GU: No hematuria Ext:  Has trace leg edema bilaterally. 1+DP/PT pulse bilaterally. Musculoskeletal: No joint deformities, No joint redness or warmth, no limitation of ROM in spin. Skin: No rashes.  Neuro: Alert, oriented X3, cranial nerves  II-XII grossly intact, moves all extremities normally Psych: Patient is not psychotic, no suicidal or hemocidal ideation.  Labs on Admission: I have personally reviewed following labs and imaging studies  CBC: Recent Labs  Lab 09/02/20 0439  WBC 8.6  NEUTROABS 5.7  HGB 12.2  HCT 38.8  MCV 94.4  PLT 829   Basic Metabolic Panel: Recent Labs  Lab 09/02/20 0439  NA 135  K 5.0  CL 104  CO2 22  GLUCOSE 327*  BUN 27*  CREATININE 1.96*  CALCIUM 9.0   GFR: Estimated Creatinine Clearance: 15.1 mL/min (A) (by C-G formula based on SCr of 1.96 mg/dL (H)). Liver Function Tests: Recent Labs  Lab 09/02/20 0439  AST 34  ALT 20  ALKPHOS 62  BILITOT 1.0  PROT 7.5  ALBUMIN 4.2  No results for input(s): LIPASE, AMYLASE in the last 168 hours. No results for input(s): AMMONIA in the last 168 hours. Coagulation Profile: No results for input(s): INR, PROTIME in the last 168 hours. Cardiac Enzymes: No results for input(s): CKTOTAL, CKMB, CKMBINDEX, TROPONINI in the last 168 hours. BNP (last 3 results) No results for input(s): PROBNP in the last 8760 hours. HbA1C: No results for input(s): HGBA1C in the last 72 hours. CBG: Recent Labs  Lab 09/02/20 0903  GLUCAP 80   Lipid Profile: No results for input(s): CHOL, HDL, LDLCALC, TRIG, CHOLHDL, LDLDIRECT in the last 72 hours. Thyroid Function Tests: No results for input(s): TSH, T4TOTAL, FREET4, T3FREE, THYROIDAB in the last 72 hours. Anemia Panel: No results for input(s): VITAMINB12, FOLATE, FERRITIN, TIBC, IRON, RETICCTPCT in the last 72 hours. Urine analysis:    Component Value Date/Time   COLORURINE YELLOW (A) 07/30/2020 0835   APPEARANCEUR HAZY (A) 07/30/2020 0835   APPEARANCEUR Cloudy 12/26/2013 1622   LABSPEC 1.013 07/30/2020 0835   LABSPEC 1.011 12/26/2013 1622   PHURINE 5.0 07/30/2020 0835   GLUCOSEU 150 (A) 07/30/2020 0835   GLUCOSEU Negative 12/26/2013 1622   HGBUR NEGATIVE 07/30/2020 0835   BILIRUBINUR NEGATIVE  07/30/2020 0835   BILIRUBINUR Negative 12/26/2013 1622   KETONESUR NEGATIVE 07/30/2020 0835   PROTEINUR NEGATIVE 07/30/2020 0835   NITRITE NEGATIVE 07/30/2020 0835   LEUKOCYTESUR NEGATIVE 07/30/2020 0835   LEUKOCYTESUR 3+ 12/26/2013 1622   Sepsis Labs: @LABRCNTIP (procalcitonin:4,lacticidven:4) ) Recent Results (from the past 240 hour(s))  Respiratory Panel by RT PCR (Flu A&B, Covid) - Nasopharyngeal Swab     Status: None   Collection Time: 09/02/20  4:40 AM   Specimen: Nasopharyngeal Swab  Result Value Ref Range Status   SARS Coronavirus 2 by RT PCR NEGATIVE NEGATIVE Final    Comment: (NOTE) SARS-CoV-2 target nucleic acids are NOT DETECTED.  The SARS-CoV-2 RNA is generally detectable in upper respiratoy specimens during the acute phase of infection. The lowest concentration of SARS-CoV-2 viral copies this assay can detect is 131 copies/mL. A negative result does not preclude SARS-Cov-2 infection and should not be used as the sole basis for treatment or other patient management decisions. A negative result may occur with  improper specimen collection/handling, submission of specimen other than nasopharyngeal swab, presence of viral mutation(s) within the areas targeted by this assay, and inadequate number of viral copies (<131 copies/mL). A negative result must be combined with clinical observations, patient history, and epidemiological information. The expected result is Negative.  Fact Sheet for Patients:  PinkCheek.be  Fact Sheet for Healthcare Providers:  GravelBags.it  This test is no t yet approved or cleared by the [place] FDA and  has been authorized for detection and/or diagnosis of SARS-CoV-2 by FDA under an Emergency Use Authorization (EUA). This EUA will remain  in effect (meaning this test can be used) for the duration of the COVID-19 declaration under Section 564(b)(1) of the Act, 21 U.S.C. section  360bbb-3(b)(1), unless the authorization is terminated or revoked sooner.     Influenza A by PCR NEGATIVE NEGATIVE Final   Influenza B by PCR NEGATIVE NEGATIVE Final    Comment: (NOTE) The Xpert Xpress SARS-CoV-2/FLU/RSV assay is intended as an aid in  the diagnosis of influenza from Nasopharyngeal swab specimens and  should not be used as a sole basis for treatment. Nasal washings and  aspirates are unacceptable for Xpert Xpress SARS-CoV-2/FLU/RSV  testing.  Fact Sheet for Patients: PinkCheek.be  Fact Sheet for Healthcare Providers: GravelBags.it  This test is not yet approved or cleared by the [place] and  has been authorized for detection and/or diagnosis of SARS-CoV-2 by  FDA under an Emergency Use Authorization (EUA). This EUA will remain  in effect (meaning this test can be used) for the duration of the  Covid-19 declaration under Section 564(b)(1) of the Act, 21  U.S.C. section 360bbb-3(b)(1), unless the authorization is  terminated or revoked. Performed at [facility], [address] [place], [place] [address]   Culture, blood (routine x 2)     Status: None (Preliminary result)   Collection Time: 09/02/20  4:45 AM   Specimen: BLOOD  Result Value Ref Range Status   Specimen Description BLOOD BLOOD RIGHT FOREARM  Final   Special Requests   Final    BOTTLES DRAWN AEROBIC AND ANAEROBIC Blood Culture adequate volume   Culture   Final    NO GROWTH <12 HOURS Performed at [facility], [address] [place], [place] [address]    Report Status PENDING  Incomplete     Radiological Exams on Admission: DG Chest Port 1 View  Result Date: 09/02/2020 CLINICAL DATA:  Shortness of breath EXAM: PORTABLE CHEST 1 VIEW COMPARISON:  08/12/2020 FINDINGS: Pleural effusions, interstitial opacity, and cephalized blood flow. Pleural effusions have decreased from prior. Normal heart size. Stable  dual-chamber pacer positioning. No pneumothorax. IMPRESSION: CHF appearance. Bilateral pleural effusion are small and have decreased from study 2 weeks ago. Electronically Signed   By: Monte Fantasia M.D.   On: 09/02/2020 05:00     EKG: I have personally reviewed. Sinus rhythm, QTC 472, LAD, anteroseptal infarction pattern.  Assessment/Plan Principal Problem:   Acute on chronic diastolic CHF (congestive heart failure) (HCC) Active Problems:   Elevated troponin   Hypothyroidism   Hypertension   GERD (gastroesophageal reflux disease)   Acute respiratory failure with hypoxia (HCC)   Acute renal failure superimposed on stage 4 chronic kidney disease (HCC)  Acute respiratory failure with hypoxia due to acute on chronic diastolic CHF (congestive heart failure) ([place]): Patient has positive JVD, elevated BNP 693, chest x-ray showed pulmonary edema, clinically consistent with CHF exacerbation.  2D echo on 07/30/2020 showed EF of 55-60% with grade 1 diastolic dysfunction.  -Will admit to progressive unit as inpatient - BiPAP -Lasix 20 mg IV daily -will not get 2d echo since pt had 2d echo on 07/30/20 -Daily weights -strict I/O's -Low salt diet -Fluid restriction -Obtain REDs Vest reading -f/u LE doppler to r/o DVT due to lower leg pain  Elevated troponin: trop 15 -->18. No CP, likely due to demand ischemia. -Continue aspirin -Trend troponin -Check A1c, FLP -Repeat EKG pneumonia  Hypothyroidism -Armour  Hypertension -IV hydralazine as needed -Patient is on IV Lasix -Amlodipine  GERD (gastroesophageal reflux disease) -Pepcid  Acute renal failure superimposed on stage 4 chronic kidney disease ([place]): Recent baseline creatinine 1.4-1.6.  Her creatinine is 1.96, BUN twenty-seven, may be due to cardiorenal syndrome -Avoid using renal toxic medications -Gentle IV diuretics as above -Follow-up renal function with BMP     DVT ppx: SQ Heparin     Code Status: DNR per her  daughter Family Communication:  Yes, patient's daughter by phone Disposition Plan:  Anticipate discharge back to previous environment Consults called:  none Admission status:   progressive unit  as inpt     Status is: Inpatient  Remains inpatient appropriate because:Inpatient level of care appropriate due to severity of illness. Patient has multiple comorbidities, now presents  with acute respiratory failure with hypoxia due to acute on chronic diastolic CHF exacerbation. Patient also has elevated troponin and worsening renal function. Her presentation is highly complicated. Patient is at high risk for deteriorating kidney old age. Patient need to be treated in hospital for at least 2 days.   Dispo: The patient is from: Home              Anticipated d/c is to: Home              Anticipated d/c date is: 2 days              Patient currently is not medically stable to d/c.          Date of Service 09/02/2020    Ivor Costa [facility]   If 7PM-7AM, please contact night-coverage [email or web address] 09/02/2020, 10:16 AM

## 2020-09-02 NOTE — ED Provider Notes (Signed)
[facility] Emergency Department Provider Note   ____________________________________________   First MD Initiated Contact with Patient 09/02/20 [phone]     (approximate)  I have reviewed the triage vital signs and the nursing notes.   HISTORY  Chief Complaint Respiratory Distress  Level of V caveat: Limited by respiratory distress  HPI Courtney Manning is a 86 y.o. female brought to the ED via EMS from home with a chief complaint of respiratory distress.  Patient with a history of CHF not on home oxygen, CKD, hypertension, GERD whose husband died last evening.  Overnight patient became progressively short of breath.  EMS reports room air saturations upon their arrival in the 50%'s.  Arrives to the ED on CPAP.  Given 1 albuterol and 1 DuoNeb en route to the ED.  EMS reports family initiated paperwork for DNR/DNI yesterday.     Past Medical History:  Diagnosis Date  . Anemia   . Cervical cancer ([place])    Uterine Cancer  . CHF (congestive heart failure) ([place])   . Chronic kidney disease   . Constipation   . GERD (gastroesophageal reflux disease)   . Glaucoma   . Hypertension   . Hypothyroidism   . Insomnia   . Spinal stenosis   . Thyroid disease     Patient Active Problem List   Diagnosis Date Noted  . CAP (community acquired pneumonia) 08/12/2020  . Elevated troponin 07/30/2020  . Acute respiratory failure ([place]) 07/30/2020  . Acute CHF (congestive heart failure) ([place]) 07/30/2020  . Hypothyroidism   . Hypertension   . GERD (gastroesophageal reflux disease)   . CKD stage 4 secondary to hypertension ([place])   . Pain due to onychomycosis of toenails of both feet 06/21/2020  . Hyponatremia 10/02/2016  . Iron deficiency anemia due to chronic blood loss 05/23/2016  . Anemia due to other cause 05/16/2016    Past Surgical History:  Procedure Laterality Date  . ABDOMINAL HYSTERECTOMY    . BACK SURGERY    . COLONOSCOPY WITH PROPOFOL N/A 09/07/2016    Procedure: COLONOSCOPY WITH PROPOFOL;  Surgeon: Lollie Sails, MD;  Location: [facility] ENDOSCOPY;  Service: Endoscopy;  Laterality: N/A;  . ESOPHAGOGASTRODUODENOSCOPY (EGD) WITH PROPOFOL N/A 09/07/2016   Procedure: ESOPHAGOGASTRODUODENOSCOPY (EGD) WITH PROPOFOL;  Surgeon: Lollie Sails, MD;  Location: [facility] ENDOSCOPY;  Service: Endoscopy;  Laterality: N/A;  . HIP SURGERY    . JOINT REPLACEMENT     Hip Replacement  . KYPHOPLASTY N/A 06/17/2015   Procedure: KYPHOPLASTY;  Surgeon: Hessie Knows, MD;  Location: [facility] ORS;  Service: Orthopedics;  Laterality: N/A;  . PACEMAKER INSERTION    . SPINE SURGERY      Prior to Admission medications   Medication Sig Start Date End Date Taking? Authorizing Provider  amLODipine (NORVASC) 5 MG tablet Take 1 tablet (5 mg total) by mouth daily. 08/03/20   Lorella Nimrod, MD  aspirin 81 MG EC tablet Take 81 mg by mouth daily.     [provider]  famotidine (PEPCID) 40 MG tablet Take 40 mg by mouth at bedtime. 05/04/20   [provider]  furosemide (LASIX) 20 MG tablet Take 1 tablet (20 mg total) by mouth daily as needed for fluid or edema. 08/02/20 08/02/21  Lorella Nimrod, MD  oxyCODONE (OXY IR/ROXICODONE) 5 MG immediate release tablet Take 5 mg by mouth at bedtime.  08/01/15   [provider]  thyroid (ARMOUR) 30 MG tablet Take 30 mg by mouth daily before breakfast.  03/07/17   [provider]    Allergies Codeine, Brimonidine tartrate, Fluoxetine, Iodine, Lodine [etodolac], Morphine, Omeprazole, Other, Propoxyphene, Brimonidine, Ciprofloxacin, and Gabapentin  Family History  Problem Relation Age of Onset  . Diabetes Mellitus II Mother   . Diabetes Mellitus II Father   . Hypertension Father     Social History Social History   Tobacco Use  . Smoking status: Never Smoker  . Smokeless tobacco: Never Used  Vaping Use  . Vaping Use: Never used  Substance Use Topics  . Alcohol use: No  . Drug use: No    Review of  Systems  Constitutional: No fever/chills Eyes: No visual changes. ENT: No sore throat. Cardiovascular: Denies chest pain. Respiratory: Positive for shortness of breath. Gastrointestinal: No abdominal pain.  No nausea, no vomiting.  No diarrhea.  No constipation. Genitourinary: Negative for dysuria. Musculoskeletal: Negative for back pain. Skin: Negative for rash. Neurological: Negative for headaches, focal weakness or numbness.   ____________________________________________   PHYSICAL EXAM:  VITAL SIGNS: ED Triage Vitals [09/02/20 0438]  Enc Vitals Group     BP      Pulse Rate 96     Resp 18     Temp      Temp src      SpO2 (!) 66 %     Weight 108 lb 0.4 oz (49 kg)     Height 5\' 6"  (1.676 m)     Head Circumference      Peak Flow      Pain Score 0     Pain Loc      Pain Edu?      Excl. in [place]?     Constitutional: Alert and oriented.  Ill appearing and in moderate to severe acute distress. Eyes: Conjunctivae are normal. PERRL. EOMI. Head: Atraumatic. Nose: No congestion/rhinnorhea. Mouth/Throat: Mucous membranes are mildly dry.   Neck: No stridor.   Cardiovascular: Normal rate, regular rhythm. Grossly normal heart sounds.  Good peripheral circulation. Respiratory: Increased respiratory effort.  No retractions. Lungs diminished with scattered wheezing and rales. Gastrointestinal: Soft and nontender. No distention. No abdominal bruits. No CVA tenderness. Musculoskeletal: No lower extremity tenderness nor edema.  No joint effusions. Neurologic:  Normal speech and language. No gross focal neurologic deficits are appreciated. MAEx4. Skin:  Skin is warm, dry and intact. No rash noted. Psychiatric: Mood and affect are normal. Speech and behavior are normal.  ____________________________________________   LABS (all labs ordered are listed, but only abnormal results are displayed)  Labs Reviewed  CBC WITH DIFFERENTIAL/PLATELET - Abnormal; Notable for the following  components:      Result Value   Eosinophils Absolute 0.6 (*)    All other components within normal limits  BRAIN NATRIURETIC PEPTIDE - Abnormal; Notable for the following components:   B Natriuretic Peptide 693.1 (*)    All other components within normal limits  LACTIC ACID, PLASMA - Abnormal; Notable for the following components:   Lactic Acid, Venous 2.4 (*)    All other components within normal limits  BLOOD GAS, ARTERIAL - Abnormal; Notable for the following components:   pH, Arterial 7.30 (*)    Acid-base deficit 4.3 (*)    All other components within normal limits  RESPIRATORY PANEL BY RT PCR (FLU A&B, COVID)  CULTURE, BLOOD (ROUTINE X 2)  CULTURE, BLOOD (ROUTINE X 2)  COMPREHENSIVE METABOLIC PANEL  LACTIC ACID, PLASMA  PROCALCITONIN  TROPONIN I (HIGH SENSITIVITY)  TROPONIN I (HIGH SENSITIVITY)   ____________________________________________  EKG  ED ECG REPORT I, Gerrad Welker J, the attending physician, personally viewed and interpreted this ECG.   Date: 09/02/2020  EKG Time: 0440  Rate: 97  Rhythm: normal EKG, normal sinus rhythm  Axis: LAD  Intervals:nonspecific intraventricular conduction delay  ST&T Change: Nonspecific  ____________________________________________  RADIOLOGY I, Cieara Stierwalt J, personally viewed and evaluated these images (plain radiographs) as part of my medical decision making, as well as reviewing the written report by the radiologist.  ED MD interpretation: CHF  Official radiology report(s): DG Chest Port 1 View  Result Date: 09/02/2020 CLINICAL DATA:  Shortness of breath EXAM: PORTABLE CHEST 1 VIEW COMPARISON:  08/12/2020 FINDINGS: Pleural effusions, interstitial opacity, and cephalized blood flow. Pleural effusions have decreased from prior. Normal heart size. Stable dual-chamber pacer positioning. No pneumothorax. IMPRESSION: CHF appearance. Bilateral pleural effusion are small and have decreased from study 2 weeks ago. Electronically Signed    By: Monte Fantasia M.D.   On: 09/02/2020 05:00    ____________________________________________   PROCEDURES  Procedure(s) performed (including Critical Care):  .1-3 Lead EKG Interpretation Performed by: Paulette Blanch, MD Authorized by: Paulette Blanch, MD     Interpretation: normal     ECG rate:  96   ECG rate assessment: normal     Rhythm: sinus rhythm     Ectopy: none     Conduction: normal   Comments:     Placed on cardiac monitor to evaluate for arrhythmias   CRITICAL CARE Performed by: Paulette Blanch   Total critical care time: 45 minutes  Critical care time was exclusive of separately billable procedures and treating other patients.  Critical care was necessary to treat or prevent imminent or life-threatening deterioration.  Critical care was time spent personally by me on the following activities: development of treatment plan with patient and/or surrogate as well as nursing, discussions with consultants, evaluation of patient's response to treatment, examination of patient, obtaining history from patient or surrogate, ordering and performing treatments and interventions, ordering and review of laboratory studies, ordering and review of radiographic studies, pulse oximetry and re-evaluation of patient's condition.   ____________________________________________   INITIAL IMPRESSION / ASSESSMENT AND PLAN / ED COURSE  As part of my medical decision making, I reviewed the following data within the [place] History obtained from family, Nursing notes reviewed and incorporated, Labs reviewed, EKG interpreted, Old chart reviewed, Radiograph reviewed, Discussed with admitting physician and Notes from prior ED visits     86 year old female with a history of CHF presenting with shortness of breath Differential includes, but is not limited to, viral syndrome, bronchitis including COPD exacerbation, pneumonia, reactive airway disease including asthma, CHF  including exacerbation with or without pulmonary/interstitial edema, pneumothorax, ACS, thoracic trauma, and pulmonary embolism.  Will obtaining cardiac panel, chest x-ray.  Patient switched to BiPAP upon arrival to the treatment room.  Anticipate need for IV Lasix for diuresis for pulmonary edema.  Anticipate hospitalization.   Clinical Course as of Sep 02 612  Thu Sep 02, 2020  1975 Elevated lactic acid noted.  Feel this is most likely secondary to respiratory acidosis.  Will add procalcitonin.  Daughter at bedside who is patient's POA.  Daughter states patient signed DNR/DNI papers yesterday.  Would not want chest compressions.  Would not want resuscitative medications.   [JS]    Clinical Course User Index [JS] Paulette Blanch, MD     ____________________________________________   FINAL CLINICAL IMPRESSION(S) / ED DIAGNOSES  Final diagnoses:  Respiratory distress  Acute respiratory failure with hypoxia (HCC)  Acute on chronic congestive heart failure, unspecified heart failure type [facility])     ED Discharge Orders    None      *Please note:  Courtney Manning was evaluated in Emergency Department on 09/02/2020 for the symptoms described in the history of present illness. She was evaluated in the context of the global COVID-19 pandemic, which necessitated consideration that the patient might be at risk for infection with the SARS-CoV-2 virus that causes COVID-19. Institutional protocols and algorithms that pertain to the evaluation of patients at risk for COVID-19 are in a state of rapid change based on information released by regulatory bodies including the CDC and federal and state organizations. These policies and algorithms were followed during the patient's care in the ED.  Some ED evaluations and interventions may be delayed as a result of limited staffing during and the pandemic.*   Note:  This document was prepared using Dragon voice recognition software and may include  unintentional dictation errors.   Paulette Blanch, MD 09/02/20 [phone]

## 2020-09-02 NOTE — TOC Initial Note (Signed)
Transition of Care [facility]) - Initial/Assessment Note    Patient Details  Name: Courtney Manning MRN: [id number] Date of Birth: December 30, 1928  Transition of Care [facility]) CM/SW Contact:    Ova Freshwater Phone Number: [phone] 09/02/2020, 3:28 PM  Clinical Narrative:                  CSW spoke with patient and explained role of TOC inpatient care. Patient stated her husband Juanda Crumble is recently deceased and the burial has been put on hold until she returns home from the hospital.  Patient is obviously grieving and stated she wishes she would have died before her husband.  CSW listened and supported the patient, while she spoke.  Patient stated she lives at home with her son and her daughter Elizabeth Sauer (daughter) [phone], Diana Davenport (son) [phone].  Patient stated she would like to return home as soon as possible.    CSW spoke with PT and he stated the patient was able to ambulated and transfer without difficulties.  Expected Discharge Plan: Skilled Nursing Facility Barriers to Discharge: Continued Medical Work up, SNF Pending bed offer   Patient Goals and CMS Choice Patient states their goals for this hospitalization and ongoing recovery are:: Patient would like to return home.  Husband Juanda Crumble was recently desaced.  Burial put on hold for patient to return home. CMS Medicare.gov Compare Post Acute Care list provided to:: Patient Choice offered to / list presented to : Patient  Expected Discharge Plan and Services Expected Discharge Plan: [place] In-house Referral: Clinical Social Work   Post Acute Care Choice: [place] arrangements for the past 2 months: [place]                                      Prior Living Arrangements/Services Living arrangements for the past 2 months: Single Family Home Lives with:: Adult Children, Spouse Patient language and need for interpreter reviewed:: Yes Do you feel safe going back to the  place where you live?: Yes      Need for Family Participation in Patient Care: Yes (Comment) Care giver support system in place?: Yes (comment)   Criminal Activity/Legal Involvement Pertinent to Current Situation/Hospitalization: No - Comment as needed  Activities of Daily Living      Permission Sought/Granted Permission sought to share information with : Family Supports Permission granted to share information with : Yes, Verbal Permission Granted  Share Information with NAME: Elizabeth Sauer (daughter) [phone], Panzy Bubeck (son) [phone]           Emotional Assessment Appearance:: Appears stated age Attitude/Demeanor/Rapport: Grieving Affect (typically observed): Grieving Orientation: : Oriented to Self, Oriented to Place, Oriented to Situation Alcohol / Substance Use: Illicit Drugs Psych Involvement: No (comment)  Admission diagnosis:  Acute respiratory failure ([place]) [J96.00] Acute on chronic diastolic CHF (congestive heart failure) ([place]) [I50.33] Patient Active Problem List   Diagnosis Date Noted  . Acute on chronic diastolic CHF (congestive heart failure) ([place]) 09/02/2020  . Acute renal failure superimposed on stage 4 chronic kidney disease ([place]) 09/02/2020  . CAP (community acquired pneumonia) 08/12/2020  . Elevated troponin 07/30/2020  . Acute respiratory failure with hypoxia ([place]) 07/30/2020  . Acute CHF (congestive heart failure) ([place]) 07/30/2020  . Hypothyroidism   . Hypertension   . GERD (gastroesophageal reflux disease)   . CKD stage 4 secondary to hypertension ([place])   .  Pain due to onychomycosis of toenails of both feet 06/21/2020  . Hyponatremia 10/02/2016  . Iron deficiency anemia due to chronic blood loss 05/23/2016  . Anemia due to other cause 05/16/2016   PCP:  Idelle Crouch, MD Pharmacy:   [id number] [address], [place] [address] Phone: [phone] Fax: [phone]     Social  Determinants of Health (SDOH) Interventions    Readmission Risk Interventions No flowsheet data found.

## 2020-09-02 NOTE — ED Notes (Signed)
US at bedside

## 2020-09-02 NOTE — Progress Notes (Signed)
PHARMACY - PHYSICIAN COMMUNICATION CRITICAL VALUE ALERT - BLOOD CULTURE IDENTIFICATION (BCID)  Courtney Manning is an 86 y.o. female who presented to [facility] on 09/02/2020 with a chief complaint of CHF exacerbation   Assessment:  Staph species in 1 of 4 bottles (no resistance detected)  (include suspected source if known)  Name of physician (or Provider) Contacted: B Morrison   Current antibiotics: none   Changes to prescribed antibiotics recommended:  no need to start abx, most likely a contaminant  Results for orders placed or performed during the hospital encounter of 09/02/20  Blood Culture ID Panel (Reflexed) (Collected: 09/02/2020  4:45 AM)  Result Value Ref Range   Enterococcus faecalis NOT DETECTED NOT DETECTED   Enterococcus Faecium NOT DETECTED NOT DETECTED   Listeria monocytogenes NOT DETECTED NOT DETECTED   Staphylococcus species DETECTED (A) NOT DETECTED   Staphylococcus aureus (BCID) NOT DETECTED NOT DETECTED   Staphylococcus epidermidis NOT DETECTED NOT DETECTED   Staphylococcus lugdunensis NOT DETECTED NOT DETECTED   Streptococcus species NOT DETECTED NOT DETECTED   Streptococcus agalactiae NOT DETECTED NOT DETECTED   Streptococcus pneumoniae NOT DETECTED NOT DETECTED   Streptococcus pyogenes NOT DETECTED NOT DETECTED   A.calcoaceticus-baumannii NOT DETECTED NOT DETECTED   Bacteroides fragilis NOT DETECTED NOT DETECTED   Enterobacterales NOT DETECTED NOT DETECTED   Enterobacter cloacae complex NOT DETECTED NOT DETECTED   Escherichia coli NOT DETECTED NOT DETECTED   Klebsiella aerogenes NOT DETECTED NOT DETECTED   Klebsiella oxytoca NOT DETECTED NOT DETECTED   Klebsiella pneumoniae NOT DETECTED NOT DETECTED   Proteus species NOT DETECTED NOT DETECTED   Salmonella species NOT DETECTED NOT DETECTED   Serratia marcescens NOT DETECTED NOT DETECTED   Haemophilus influenzae NOT DETECTED NOT DETECTED   Neisseria meningitidis NOT DETECTED NOT DETECTED   Pseudomonas  aeruginosa NOT DETECTED NOT DETECTED   Stenotrophomonas maltophilia NOT DETECTED NOT DETECTED   Candida albicans NOT DETECTED NOT DETECTED   Candida auris NOT DETECTED NOT DETECTED   Candida glabrata NOT DETECTED NOT DETECTED   Candida krusei NOT DETECTED NOT DETECTED   Candida parapsilosis NOT DETECTED NOT DETECTED   Candida tropicalis NOT DETECTED NOT DETECTED   Cryptococcus neoformans/gattii NOT DETECTED NOT DETECTED    Syeda Prickett D 09/02/2020  10:43 PM

## 2020-09-02 NOTE — ED Triage Notes (Signed)
Pt to ED via EMS from home. Pt called out for sob, upon arrival pts sats 49%, ems put pt on cpap with sats to 71%, pt given 1 albuterol and 1 duo. Pt doesn't normally wear o2 at home. Pt found out husband passed last night.

## 2020-09-02 NOTE — Progress Notes (Signed)
Report called to [place] on 2A

## 2020-09-03 DIAGNOSIS — I5033 Acute on chronic diastolic (congestive) heart failure: Secondary | ICD-10-CM

## 2020-09-03 DIAGNOSIS — N179 Acute kidney failure, unspecified: Secondary | ICD-10-CM

## 2020-09-03 DIAGNOSIS — N184 Chronic kidney disease, stage 4 (severe): Secondary | ICD-10-CM

## 2020-09-03 DIAGNOSIS — J9601 Acute respiratory failure with hypoxia: Secondary | ICD-10-CM

## 2020-09-03 LAB — CBC
HCT: 29.8 % — ABNORMAL LOW (ref 36.0–46.0)
Hemoglobin: 9.9 g/dL — ABNORMAL LOW (ref 12.0–15.0)
MCH: 29.6 pg (ref 26.0–34.0)
MCHC: 33.2 g/dL (ref 30.0–36.0)
MCV: 89 fL (ref 80.0–100.0)
Platelets: 229 10*3/uL (ref 150–400)
RBC: 3.35 MIL/uL — ABNORMAL LOW (ref 3.87–5.11)
RDW: 13.7 % (ref 11.5–15.5)
WBC: 8.3 10*3/uL (ref 4.0–10.5)
nRBC: 0 % (ref 0.0–0.2)

## 2020-09-03 LAB — BASIC METABOLIC PANEL
Anion gap: 11 (ref 5–15)
BUN: 25 mg/dL — ABNORMAL HIGH (ref 8–23)
CO2: 23 mmol/L (ref 22–32)
Calcium: 9.1 mg/dL (ref 8.9–10.3)
Chloride: 101 mmol/L (ref 98–111)
Creatinine, Ser: 1.83 mg/dL — ABNORMAL HIGH (ref 0.44–1.00)
GFR, Estimated: 26 mL/min — ABNORMAL LOW (ref >60)
Glucose, Bld: 90 mg/dL (ref 70–99)
Potassium: 3.7 mmol/L (ref 3.5–5.1)
Sodium: 135 mmol/L (ref 135–145)

## 2020-09-03 LAB — LIPID PANEL
Cholesterol: 252 mg/dL — ABNORMAL HIGH (ref 0–200)
HDL: 63 mg/dL (ref >40)
LDL Cholesterol: 173 mg/dL — ABNORMAL HIGH (ref 0–99)
Total CHOL/HDL Ratio: 4 RATIO
Triglycerides: 81 mg/dL (ref <150)
VLDL: 16 mg/dL (ref 0–40)

## 2020-09-03 LAB — HEMOGLOBIN A1C
Hgb A1c MFr Bld: 5.4 % (ref 4.8–5.6)
Mean Plasma Glucose: 108.28 mg/dL

## 2020-09-03 LAB — LACTIC ACID, PLASMA
Lactic Acid, Venous: 1.5 mmol/L (ref 0.5–1.9)
Lactic Acid, Venous: 1.6 mmol/L (ref 0.5–1.9)

## 2020-09-03 LAB — MAGNESIUM: Magnesium: 2.1 mg/dL (ref 1.7–2.4)

## 2020-09-03 MED ORDER — FAMOTIDINE 20 MG PO TABS
20.0000 mg | ORAL_TABLET | Freq: Every day | ORAL | Status: DC
  Administered 2020-09-03: 20 mg via ORAL
  Filled 2020-09-03: qty 1

## 2020-09-03 MED ORDER — ADULT MULTIVITAMIN W/MINERALS CH
1.0000 | ORAL_TABLET | Freq: Every day | ORAL | Status: DC
  Administered 2020-09-04: 1 via ORAL
  Filled 2020-09-03: qty 1

## 2020-09-03 MED ORDER — ENSURE ENLIVE PO LIQD
237.0000 mL | Freq: Two times a day (BID) | ORAL | Status: DC
  Administered 2020-09-03 – 2020-09-04 (×2): 237 mL via ORAL

## 2020-09-03 NOTE — Progress Notes (Signed)
  Heart Failure Nurse Navigator Note  HFpEF 82-99%, Grade I Diastolic Dysfunction  She presented with complaints of worsening SOB.    Co morbidities:  Hypertension CKD stage IV Anemia   Medications;  Norvasc 5 mg daily ASA 81 mg daily Lasix 20 mg IV daily   Labs:  Sodium 135, potassium 3.7, BUN 25, creatinine 1.83, troponin 15, 18, BNP 693,magnesium 2.1. Total cholesterol 252, triglycerides 81, Hdl 63, Ldl 173.  CXR revealed pulmonary edema.  Weight- 46 kg (53.9)  Intake and output not documented.  BMI 17 BP 113/68 pulse 81   Assessment:  General- she is awake and alert, sitting up in chair at bedside.  She denies any increasing SOB.  HEENT- non icteric, pupils equal, normocephalic,HOH  Cardiac- heart tones are regular.  Chest- to posterior auscultation, fine crackles in the bases that does not clear with deep breathes and coughing.  Abdomen- soft, non tender  Musculoskeletal- no lower extremity edema.  Psych- is pleasant and appropriate, makes eye contact.  Neuro- speech is clear, moves all extremities.    Initial visit with patient. She denies any sob at this time. States she needs to get home as her husband just died.    She states that she weighs herself daily and had not noted a weight gain.  Her son lives with her and takes care of the house and does the cooking.  Once in a while they do eat prepared chicken noodle soup.  Also discussed fluid restriction.  She knows that she has heart failure and has read about it but can not remember what it was called.  Secure chatted with staff about need for accurate I and O.   Pricilla Riffle RN, CHFN

## 2020-09-03 NOTE — Progress Notes (Signed)
Initial Nutrition Assessment  DOCUMENTATION CODES:   Non-severe (moderate) malnutrition in context of chronic illness  INTERVENTION:   Ensure Enlive po BID, each supplement provides 350 kcal and 20 grams of protein  MVI daily   Liberalize diet   NUTRITION DIAGNOSIS:   Moderate Malnutrition related to chronic illness (CHF, advanced age) as evidenced by moderate fat depletion, moderate muscle depletion, severe muscle depletion.  GOAL:   Patient will meet greater than or equal to 90% of their needs  MONITOR:   PO intake, Supplement acceptance, Labs, Weight trends, Skin, I & O's  REASON FOR ASSESSMENT:   Other (Comment) (Low BMI)    ASSESSMENT:   86 y.o. female with medical history significant of hypertension, dCHF, CKD stage IV, uterine cancer, anemia, GERD and hypothyroidism who presents with CHF exacerbation   Met with pt in room today. Pt reports fair appetite and oral intake pta; pt reports that she is not a big eater at baseline. Pt reports that her son lives with her and prepares all of her meals. Pt reports that she does have Ensure at home but that she does not drink it regularly. Pt reports eating ~50% of her breakfast this morning. Pt is willing to drink vanilla Ensure. RD will add supplements and MVI to help pt meet her estimated needs. RD will also liberalize the heart healthy portion of pt's diet as this is restrictive of protein. Per chart, pt is down 7lbs(6%) over the past 2 weeks; RD unsure how much weight loss is r/t fluid changes.   Medications reviewed and include: aspirin, pepcid, lasix, heparin, oxycodone  Labs reviewed: BUN 25(H), creat 1.83(H) Hgb 9.9(L), Hct 29.8(L)  NUTRITION - FOCUSED PHYSICAL EXAM:    Most Recent Value  Orbital Region Moderate depletion  Upper Arm Region Mild depletion  Thoracic and Lumbar Region Mild depletion  Buccal Region Moderate depletion  Temple Region Moderate depletion  Clavicle Bone Region Severe depletion  Clavicle  and Acromion Bone Region Severe depletion  Scapular Bone Region Moderate depletion  Dorsal Hand Severe depletion  Patellar Region Severe depletion  Anterior Thigh Region Severe depletion  Posterior Calf Region Severe depletion  Edema (RD Assessment) None  Hair Reviewed  Eyes Reviewed  Mouth Reviewed  Skin Reviewed  Nails Reviewed     Diet Order:   Diet Order            Diet 2 gram sodium Room service appropriate? Yes; Fluid consistency: Thin; Fluid restriction: 1500 mL Fluid  Diet effective now                EDUCATION NEEDS:   Education needs have been addressed  Skin:  Skin Assessment: Reviewed RN Assessment (ecchymosis)  Last BM:  11/2  Height:   Ht Readings from Last 1 Encounters:  09/02/20 '5\' 4"'  (1.626 m)    Weight:   Wt Readings from Last 1 Encounters:  09/03/20 46 kg    Ideal Body Weight:  54.5 kg  BMI:  Body mass index is 17.41 kg/m.  Estimated Nutritional Needs:   Kcal:  1200-1400kcal/day  Protein:  60-70g/day  Fluid:  1.2-1.4L/day  Koleen Distance MS, RD, LDN Please refer to [facility] for RD and/or RD on-call/weekend/after hours pager

## 2020-09-03 NOTE — Progress Notes (Addendum)
PROGRESS NOTE    Courtney Manning  WNI:[id number] DOB: 03-Feb-1929 DOA: 09/02/2020 PCP: Idelle Crouch, MD   Assessment & Plan:   Principal Problem:   Acute on chronic diastolic CHF (congestive heart failure) ([place]) Active Problems:   Elevated troponin   Hypothyroidism   Hypertension   GERD (gastroesophageal reflux disease)   Acute respiratory failure with hypoxia ([facility])   Acute renal failure superimposed on stage 4 chronic kidney disease ([place])   Acute hypoxic respiratory failure: secondary to CHF exacerbation. Echo on 07/30/2020 showed EF of 55-60% with grade 1 diastolic dysfunction. Weaned from supplemental oxygen today   Acute on chronic diastolic CHF exacerbation: continue on IV lasix. Strict I/Os and daily weights. Fluid restriction.   Elevated troponin: likely secondary to demand ischemia  Hypothyroidism: continue on home dose of armour   Hypertension: continue on amlodipine. IV hydralazine prn   GERD: continue on pepcid   AKI on CKDIV: baseline Cr is 1.4-1.6. 1.4-1.6. Cr is trending down slightly from day prior. Will continue to monitor    DVT prophylaxis: heparin  Code Status: full  Family Communication: discussed pt's care w/ pt's daughter, Malachy Mood, and answered her questions Disposition Plan: likely d/c back home tomorrow  Status is: Inpatient  Remains inpatient appropriate because:IV treatments appropriate due to intensity of illness or inability to take PO   Dispo: The patient is from: Home              Anticipated d/c is to: Home              Anticipated d/c date is: 1 day              Patient currently is not medically stable to d/c.    Consultants:      Procedures:   Antimicrobials:   Subjective: Pt c/o fatigue but anxious to get home so she can help her children plan their father & her husband funeral.  Objective: Vitals:   09/02/20 1803 09/02/20 1934 09/03/20 0246 09/03/20 0412  BP:  121/79  (!) 121/50  Pulse:  96  83  Resp:  20  18   Temp:  (!) 97.5 F (36.4 C)  99.2 F (37.3 C)  TempSrc:  Oral  Oral  SpO2:  98%  94%  Weight: 53.9 kg  46 kg   Height: 5\' 4"  (1.626 m)      No intake or output data in the 24 hours ending 09/03/20 0728 Filed Weights   09/02/20 0438 09/02/20 1803 09/03/20 0246  Weight: 49 kg 53.9 kg 46 kg    Examination:  General exam: Appears calm and comfortable  Respiratory system: diminished breath sounds b/l  Cardiovascular system: S1 & S2 +. No rubs, gallops or clicks.  Gastrointestinal system: Abdomen is nondistended, soft and nontender.  Normal bowel sounds heard. Central nervous system: Alert and oriented. Moves all 4 extremities  Psychiatry: Judgement and insight appear normal.Flat mood and affect.     Data Reviewed: I have personally reviewed following labs and imaging studies  CBC: Recent Labs  Lab 09/02/20 0439 09/03/20 0426  WBC 8.6 8.3  NEUTROABS 5.7  --   HGB 12.2 9.9*  HCT 38.8 29.8*  MCV 94.4 89.0  PLT 302 381   Basic Metabolic Panel: Recent Labs  Lab 09/02/20 0439 09/03/20 0426  NA 135 135  K 5.0 3.7  CL 104 101  CO2 22 23  GLUCOSE 327* 90  BUN 27* 25*  CREATININE 1.96* 1.83*  CALCIUM 9.0 9.1  MG  --  2.1   GFR: Estimated Creatinine Clearance: 15.1 mL/min (A) (by C-G formula based on SCr of 1.83 mg/dL (H)). Liver Function Tests: Recent Labs  Lab 09/02/20 0439  AST 34  ALT 20  ALKPHOS 62  BILITOT 1.0  PROT 7.5  ALBUMIN 4.2   No results for input(s): LIPASE, AMYLASE in the last 168 hours. No results for input(s): AMMONIA in the last 168 hours. Coagulation Profile: No results for input(s): INR, PROTIME in the last 168 hours. Cardiac Enzymes: No results for input(s): CKTOTAL, CKMB, CKMBINDEX, TROPONINI in the last 168 hours. BNP (last 3 results) No results for input(s): PROBNP in the last 8760 hours. HbA1C: No results for input(s): HGBA1C in the last 72 hours. CBG: Recent Labs  Lab 09/02/20 0903  GLUCAP 80   Lipid Profile: Recent Labs     09/03/20 0426  CHOL 252*  HDL 63  LDLCALC 173*  TRIG 81  CHOLHDL 4.0   Thyroid Function Tests: No results for input(s): TSH, T4TOTAL, FREET4, T3FREE, THYROIDAB in the last 72 hours. Anemia Panel: No results for input(s): VITAMINB12, FOLATE, FERRITIN, TIBC, IRON, RETICCTPCT in the last 72 hours. Sepsis Labs: Recent Labs  Lab 09/02/20 0439 09/02/20 0639  PROCALCITON <0.10  --   LATICACIDVEN 2.4* 2.0*    Recent Results (from the past 240 hour(s))  Respiratory Panel by RT PCR (Flu A&B, Covid) - Nasopharyngeal Swab     Status: None   Collection Time: 09/02/20  4:40 AM   Specimen: Nasopharyngeal Swab  Result Value Ref Range Status   SARS Coronavirus 2 by RT PCR NEGATIVE NEGATIVE Final    Comment: (NOTE) SARS-CoV-2 target nucleic acids are NOT DETECTED.  The SARS-CoV-2 RNA is generally detectable in upper respiratoy specimens during the acute phase of infection. The lowest concentration of SARS-CoV-2 viral copies this assay can detect is 131 copies/mL. A negative result does not preclude SARS-Cov-2 infection and should not be used as the sole basis for treatment or other patient management decisions. A negative result may occur with  improper specimen collection/handling, submission of specimen other than nasopharyngeal swab, presence of viral mutation(s) within the areas targeted by this assay, and inadequate number of viral copies (<131 copies/mL). A negative result must be combined with clinical observations, patient history, and epidemiological information. The expected result is Negative.  Fact Sheet for Patients:  PinkCheek.be  Fact Sheet for Healthcare Providers:  GravelBags.it  This test is no t yet approved or cleared by the [place] FDA and  has been authorized for detection and/or diagnosis of SARS-CoV-2 by FDA under an Emergency Use Authorization (EUA). This EUA will remain  in effect (meaning  this test can be used) for the duration of the COVID-19 declaration under Section 564(b)(1) of the Act, 21 U.S.C. section 360bbb-3(b)(1), unless the authorization is terminated or revoked sooner.     Influenza A by PCR NEGATIVE NEGATIVE Final   Influenza B by PCR NEGATIVE NEGATIVE Final    Comment: (NOTE) The Xpert Xpress SARS-CoV-2/FLU/RSV assay is intended as an aid in  the diagnosis of influenza from Nasopharyngeal swab specimens and  should not be used as a sole basis for treatment. Nasal washings and  aspirates are unacceptable for Xpert Xpress SARS-CoV-2/FLU/RSV  testing.  Fact Sheet for Patients: PinkCheek.be  Fact Sheet for Healthcare Providers: GravelBags.it  This test is not yet approved or cleared by the [place] FDA and  has been authorized for detection and/or diagnosis of SARS-CoV-2 by  FDA under  an Emergency Use Authorization (EUA). This EUA will remain  in effect (meaning this test can be used) for the duration of the  Covid-19 declaration under Section 564(b)(1) of the Act, 21  U.S.C. section 360bbb-3(b)(1), unless the authorization is  terminated or revoked. Performed at [facility], [address] [place], [place] [address]   Culture, blood (routine x 2)     Status: None (Preliminary result)   Collection Time: 09/02/20  4:45 AM   Specimen: BLOOD  Result Value Ref Range Status   Specimen Description   Final    BLOOD BLOOD RIGHT FOREARM Performed at [facility], [address] [place], [place] [address]    Special Requests   Final    BOTTLES DRAWN AEROBIC AND ANAEROBIC Blood Culture adequate volume Performed at [facility], [address] [place], [place] [address]    Culture  Setup Time   Final    GRAM POSITIVE COCCI IN BOTH AEROBIC AND ANAEROBIC BOTTLES CRITICAL RESULT CALLED TO, READ BACK BY AND VERIFIED WITH: JASON ROBINS AT 2239 ON 09/02/20 BY SS     Culture GRAM POSITIVE COCCI  Final   Report Status PENDING  Incomplete  Blood Culture ID Panel (Reflexed)     Status: Abnormal   Collection Time: 09/02/20  4:45 AM  Result Value Ref Range Status   Enterococcus faecalis NOT DETECTED NOT DETECTED Final   Enterococcus Faecium NOT DETECTED NOT DETECTED Final   Listeria monocytogenes NOT DETECTED NOT DETECTED Final   Staphylococcus species DETECTED (A) NOT DETECTED Final    Comment: CRITICAL RESULT CALLED TO, READ BACK BY AND VERIFIED WITH: JASON ROBINS AT 2239 ON 09/02/20 BY SS    Staphylococcus aureus (BCID) NOT DETECTED NOT DETECTED Final   Staphylococcus epidermidis NOT DETECTED NOT DETECTED Final   Staphylococcus lugdunensis NOT DETECTED NOT DETECTED Final   Streptococcus species NOT DETECTED NOT DETECTED Final   Streptococcus agalactiae NOT DETECTED NOT DETECTED Final   Streptococcus pneumoniae NOT DETECTED NOT DETECTED Final   Streptococcus pyogenes NOT DETECTED NOT DETECTED Final   A.calcoaceticus-baumannii NOT DETECTED NOT DETECTED Final   Bacteroides fragilis NOT DETECTED NOT DETECTED Final   Enterobacterales NOT DETECTED NOT DETECTED Final   Enterobacter cloacae complex NOT DETECTED NOT DETECTED Final   Escherichia coli NOT DETECTED NOT DETECTED Final   Klebsiella aerogenes NOT DETECTED NOT DETECTED Final   Klebsiella oxytoca NOT DETECTED NOT DETECTED Final   Klebsiella pneumoniae NOT DETECTED NOT DETECTED Final   Proteus species NOT DETECTED NOT DETECTED Final   Salmonella species NOT DETECTED NOT DETECTED Final   Serratia marcescens NOT DETECTED NOT DETECTED Final   Haemophilus influenzae NOT DETECTED NOT DETECTED Final   Neisseria meningitidis NOT DETECTED NOT DETECTED Final   Pseudomonas aeruginosa NOT DETECTED NOT DETECTED Final   Stenotrophomonas maltophilia NOT DETECTED NOT DETECTED Final   Candida albicans NOT DETECTED NOT DETECTED Final   Candida auris NOT DETECTED NOT DETECTED Final   Candida glabrata NOT DETECTED  NOT DETECTED Final   Candida krusei NOT DETECTED NOT DETECTED Final   Candida parapsilosis NOT DETECTED NOT DETECTED Final   Candida tropicalis NOT DETECTED NOT DETECTED Final   Cryptococcus neoformans/gattii NOT DETECTED NOT DETECTED Final    Comment: Performed at [facility], [address] [place], [place] [address]  Culture, blood (routine x 2)     Status: None (Preliminary result)   Collection Time: 09/02/20  6:34 PM   Specimen: BLOOD  Result Value Ref Range Status  Specimen Description BLOOD RIGHT ANTECUBITAL  Final   Special Requests   Final    BOTTLES DRAWN AEROBIC AND ANAEROBIC Blood Culture adequate volume   Culture   Final    NO GROWTH < 12 HOURS Performed at [facility], [address], [place], [place] [address]    Report Status PENDING  Incomplete         Radiology Studies: US Venous Img Lower Bilateral (DVT)  Result Date: 09/02/2020 CLINICAL DATA:  Leg pain. EXAM: LEFT LOWER EXTREMITY VENOUS DOPPLER ULTRASOUND TECHNIQUE: Gray-scale sonography with compression, as well as color and duplex ultrasound, were performed to evaluate the deep venous system(s) from the level of the common femoral vein through the popliteal and proximal calf veins. COMPARISON:  None. FINDINGS: VENOUS Normal compressibility of the common femoral, superficial femoral, and popliteal veins, as well as the visualized calf veins. Visualized portions of profunda femoral vein and great saphenous vein unremarkable. No filling defects to suggest DVT on grayscale or color Doppler imaging. Doppler waveforms show normal direction of venous flow, normal respiratory plasticity and response to augmentation. Limited views of the contralateral common femoral vein are unremarkable. IMPRESSION: Negative. Electronically Signed   By: Margaretha Sheffield MD   On: 09/02/2020 11:09   DG Chest Port 1 View  Result Date: 09/02/2020 CLINICAL DATA:  Shortness of breath EXAM: PORTABLE CHEST 1 VIEW  COMPARISON:  08/12/2020 FINDINGS: Pleural effusions, interstitial opacity, and cephalized blood flow. Pleural effusions have decreased from prior. Normal heart size. Stable dual-chamber pacer positioning. No pneumothorax. IMPRESSION: CHF appearance. Bilateral pleural effusion are small and have decreased from study 2 weeks ago. Electronically Signed   By: Monte Fantasia M.D.   On: 09/02/2020 05:00        Scheduled Meds: . amLODipine  5 mg Oral Daily  . aspirin EC  81 mg Oral Daily  . famotidine  40 mg Oral QHS  . furosemide  20 mg Intravenous Daily  . heparin  5,000 Units Subcutaneous Q8H  . oxyCODONE  5 mg Oral QHS  . sodium chloride flush  3 mL Intravenous Q12H  . thyroid  30 mg Oral QAC breakfast  . timolol  1 drop Both Eyes BID   Continuous Infusions: . sodium chloride       LOS: 1 day    Time spent: 33 mins     Wyvonnia Dusky, MD [facility] Pager [phone]  If 7PM-7AM, please contact night-coverage [email or web address] 09/03/2020, 7:28 AM

## 2020-09-03 NOTE — Evaluation (Signed)
Physical Therapy Re-Evaluation Patient Details Name: Courtney Manning MRN: [id number] DOB: 02-18-1929 Today's Date: 09/03/2020   History of Present Illness  Pt is an 86 y.o. female brought to the ED via EMS from home with a chief complaint of respiratory distress.  Patient with a history of CHF not on home oxygen, CKD, hypertension, GERD whose husband died last evening.  Overnight patient became progressively short of breath.  EMS reports room air saturations upon their arrival in the 50%'s.  MD assessment includes: Acute on chronic diastolic CHF, elevated troponin likely due to demand ischemia, hypothyroidism, HTN, and acute on chronic renal failure.  Clinical Impression  New PT consult received; PT re-evaluation performed.  Prior to hospital admission, pt was modified independent ambulating with RW; lives with family.  Pt on 4 L O2 via nasal cannula; trialed pt on room air during session per request of pt's nurse.  Currently pt is modified independent with bed mobility; modified independent with transfers; and SBA ambulating up to 200 feet with RW.  Pt's O2 sats initially 92% on room air at rest; pt's O2 sats decreased to 86-87% with short distances ambulating chair to/from bathroom but recovered to 91-92% with sitting rest and vc's for pursed lip breathing.  Pt then ambulated around nursing loop (200 feet) with RW and pt's O2 sats 90% or greater on room air; very mild SOB noted with activity; O2 sats 91-94% on room air at rest end of session (nursed cleared PT to keep pt on room air).  Pt appears to be at baseline level of functional mobility; no acute PT needs identified; will sign off.  Please re-consult PT if pt's status changes and acute PT needs are identified.    Follow Up Recommendations No PT follow up    Equipment Recommendations  None recommended by PT    Recommendations for Other Services       Precautions / Restrictions Precautions Precautions: Fall Precaution Comments: monitor O2  sats Restrictions Weight Bearing Restrictions: No      Mobility  Bed Mobility Overal bed mobility: Modified Independent Bed Mobility: Supine to Sit     Supine to sit: Modified independent (Device/Increase time)     General bed mobility comments: mild increased time to perform bed mobility on own    Transfers Overall transfer level: Modified independent Equipment used: Rolling walker (2 wheeled) Transfers: Sit to/from [facility] Sit to Stand: Modified independent (Device/Increase time) Stand pivot transfers: Modified independent (Device/Increase time) (stand step turn bed to recliner)       General transfer comment: steady safe transfers from bed x1 trial, from recliner x2 trials, and from toilet (using grab bar) x1 trial  Ambulation/Gait Ambulation/Gait assistance: Supervision (SBA for monitoring of O2 sats) Gait Distance (Feet):  (20 feet x2; 200 feet) Assistive device: Rolling walker (2 wheeled) Gait Pattern/deviations: Step-through pattern;Decreased step length - right;Decreased step length - left Gait velocity: mildly decreased   General Gait Details: steady ambulation  Science writer    Modified Rankin (Stroke Patients Only)       Balance Overall balance assessment: No apparent balance deficits (not formally assessed) Sitting-balance support: No upper extremity supported;Feet supported Sitting balance-Leahy Scale: Normal Sitting balance - Comments: steady sitting reaching outside BOS   Standing balance support: No upper extremity supported Standing balance-Leahy Scale: Good Standing balance comment: steady standing washing hands at sink  Pertinent Vitals/Pain Pain Assessment: No/denies pain  HR WFL during sessions activities.    Home Living Family/patient expects to be discharged to:: Private residence Living Arrangements: Children (Lives with son and other  family) Available Help at Discharge: Family;Available 24 hours/day Type of Home: House Home Access: Stairs to enter Entrance Stairs-Rails: Right;Left;Can reach both Entrance Stairs-Number of Steps: 2 Home Layout: One level Home Equipment: Walker - 2 wheels;Cane - single point;Grab bars - toilet;Shower seat;Bedside commode;Wheelchair - manual Additional Comments: Pt does not use wheelchair or 3 in1 but has from spouse    Prior Function Level of Independence: Independent with assistive device(s)   Gait / Transfers Assistance Needed: Modified independent ambulating with RW limited community distances; no h/o recent falls; w/c for  distances; independent with ADL's  ADL's / Homemaking Assistance Needed: Per OT eval "family help with cooking and cleaning but pt reports mod I with self care tasks and bathing while seated on shower chair"        Hand Dominance   Dominant Hand: Right    Extremity/Trunk Assessment   Upper Extremity Assessment Upper Extremity Assessment: Overall WFL for tasks assessed    Lower Extremity Assessment Lower Extremity Assessment: Generalized weakness    Cervical / Trunk Assessment Cervical / Trunk Assessment: Kyphotic  Communication   Communication: HOH  Cognition Arousal/Alertness: Awake/alert Behavior During Therapy: WFL for tasks assessed/performed Overall Cognitive Status: Within Functional Limits for tasks assessed                                        General Comments   Nursing cleared pt for participation in physical therapy.  Pt agreeable to PT session.  Pt's daughter present during session.    Exercises     Assessment/Plan    PT Assessment Patent does not need any further PT services  PT Problem List         PT Treatment Interventions      PT Goals (Current goals can be found in the Care Plan section)  Acute Rehab PT Goals Patient Stated Goal: to go home and feel better PT Goal Formulation: All  assessment and education complete, DC therapy Time For Goal Achievement: 09/03/20 Potential to Achieve Goals: Good    Frequency     Barriers to discharge        Co-evaluation               AM-PAC PT "6 Clicks" Mobility  Outcome Measure Help needed turning from your back to your side while in a flat bed without using bedrails?: None Help needed moving from lying on your back to sitting on the side of a flat bed without using bedrails?: None Help needed moving to and from a bed to a chair (including a wheelchair)?: None Help needed standing up from a chair using your arms (e.g., wheelchair or bedside chair)?: None Help needed to walk in hospital room?: None Help needed climbing 3-5 steps with a railing? : None 6 Click Score: 24    End of Session Equipment Utilized During Treatment: Gait belt Activity Tolerance: Patient tolerated treatment well Patient left: in chair;with call bell/phone within reach;with chair alarm set Nurse Communication: Mobility status;Precautions;Other (comment) (pt's O2 sats during session) PT Visit Diagnosis: Muscle weakness (generalized) (M62.81)    Time: 1000-1045 PT Time Calculation (min) (ACUTE ONLY): 45 min   Charges:   PT Evaluation $PT Re-evaluation: 1  Re-eval PT Treatments $Therapeutic Activity: 23-37 mins       Leitha Bleak, PT 09/03/20, 11:23 AM

## 2020-09-03 NOTE — Progress Notes (Signed)
Mobility Specialist - Progress Note   09/03/20 1200  Mobility  Activity Ambulated to bathroom  Level of Assistance Modified independent, requires aide device or extra time  Assistive Device Front wheel walker  Distance Ambulated (ft) 20 ft  Mobility Response Tolerated well  Mobility performed by Mobility specialist  $Mobility charge 1 Mobility    Pt was ambulating in room upon arrival. Pt agreed to session. Pt was modI in ambulation this date. No LOB noted, no heavy breathing. Mobility assisted with clearing path for safe ambulation. Pt independent in hygiene tasks. Pt was able to transfer EOB-supine with SBA. Overall, pt tolerated session well. Pt was left in bed with all needs in reach. Nurse-ST entered at the end of session.    Kathee Delton Mobility Specialist 09/03/20, 1:03 PM

## 2020-09-04 DIAGNOSIS — I10 Essential (primary) hypertension: Secondary | ICD-10-CM

## 2020-09-04 DIAGNOSIS — E440 Moderate protein-calorie malnutrition: Secondary | ICD-10-CM | POA: Insufficient documentation

## 2020-09-04 LAB — BASIC METABOLIC PANEL
Anion gap: 10 (ref 5–15)
BUN: 26 mg/dL — ABNORMAL HIGH (ref 8–23)
CO2: 24 mmol/L (ref 22–32)
Calcium: 9 mg/dL (ref 8.9–10.3)
Chloride: 101 mmol/L (ref 98–111)
Creatinine, Ser: 1.75 mg/dL — ABNORMAL HIGH (ref 0.44–1.00)
GFR, Estimated: 28 mL/min — ABNORMAL LOW (ref >60)
Glucose, Bld: 93 mg/dL (ref 70–99)
Potassium: 3.8 mmol/L (ref 3.5–5.1)
Sodium: 135 mmol/L (ref 135–145)

## 2020-09-04 NOTE — Plan of Care (Signed)

## 2020-09-04 NOTE — Discharge Summary (Signed)
Physician Discharge Summary  Courtney Manning GGY:[id number] DOB: [birth year] DOA: 09/02/2020  PCP: Idelle Crouch, MD  Admit date: 09/02/2020 Discharge date: 09/04/2020  Admitted From: home  Disposition: home  Recommendations for Outpatient Follow-up:  1. Follow up with PCP in 1-2 weeks 2. F/u cardio in 1-2 weeks   Home Health: no  Equipment/Devices:  Discharge Condition: stable  CODE STATUS: full  Diet recommendation: Heart Healthy  Brief/Interim Summary: HPI was taken Dr. Blaine Hamper: Courtney Manning is a 86 y.o. female with medical history significant of hypertension, dCHF, CKD stage IV, uterine cancer, anemia, GERD, hypothyroidism, who presents with shortness of breath.  Patient was recently hospitalized from 10/14-10/18 due to CAP and CHF exacerbation. Patient states that she developed worsening shortness of breath in the past several days, which has been progressively worsening. Patient denies chest pain, cough, fever or chills. No nausea vomiting, diarrhea or abdominal pain, no symptoms of UTI. Patient states that she has left lower leg pain. No recent fall. Patient was found to have oxygen desaturation to 49% on room air, BiPAP is started in ED. Per report, pt's husband passed away last evening. EMS reports family initiated paperwork for DNR/DNI yesterday.  ED Course: pt was found to have trop 15 -->18, BNP 693, lactic acid 2.4, troponin 0, negative Covid PCR, worsening renal function, temperature normal, blood pressure 128/99, heart rate 69, RR 24, chest x-ray showed pulmonary edema with small bilateral pleural effusion. Patient is admitted to progressive bed as inpatient.  Hospital Course from Dr. Lenise Herald 11/5-11/6/21: Pt presented w/ shortness of breath found to be secondary to CHF exacerbation. Pt was treated w/ IV lasix and fluid restriction. Pt tolerated the treatment very well. The HF nurse also evaluated the pt as well. Pt will f/u w/ cardiologist as an outpatient in 1-2 weeks  and pt and pt's daughter both verbalized their understanding.   Discharge Diagnoses:  Principal Problem:   Acute on chronic diastolic CHF (congestive heart failure) (HCC) Active Problems:   Elevated troponin   Hypothyroidism   Hypertension   GERD (gastroesophageal reflux disease)   Acute respiratory failure with hypoxia (HCC)   Acute renal failure superimposed on stage 4 chronic kidney disease (HCC)   Malnutrition of moderate degree  Acute hypoxic respiratory failure: secondary to CHF exacerbation. Echo on 07/30/2020 showed EF of 55-60% with grade 1 diastolic dysfunction. Resolved   Acute on chronic diastolic CHF exacerbation: continue on IV lasix. Strict I/Os and daily weights. Fluid restriction.   Elevated troponin: likely secondary to demand ischemia  Hypothyroidism: continue on home dose of armour   Hypertension: continue on amlodipine. IV hydralazine prn   GERD: continue on pepcid   AKI on CKDIV: baseline Cr is 1.4-1.6. 1.4-1.6. Cr is trending down slightly from day prior. Will continue to monitor    Discharge Instructions  Discharge Instructions    Diet - low sodium heart healthy   Complete by: As directed    Discharge instructions   Complete by: As directed    F/u PCP in 1 week. F/u cardio in 1-2 weeks   Increase activity slowly   Complete by: As directed      Allergies as of 09/04/2020      Reactions   Codeine Anaphylaxis   Not Specified   Brimonidine Tartrate Itching   Fluoxetine Other (See Comments)   Reaction: Unknown   Iodine    Lodine [etodolac]    Morphine Itching   Omeprazole Nausea And Vomiting   Other Other (  See Comments)   Propoxyphene Other (See Comments)   Brimonidine Itching, Other (See Comments)   Ciprofloxacin Rash   Gabapentin Rash      Medication List    TAKE these medications   amLODipine 5 MG tablet Commonly known as: NORVASC Take 1 tablet (5 mg total) by mouth daily.   aspirin 81 MG EC tablet Take 81 mg by mouth  daily.   famotidine 40 MG tablet Commonly known as: PEPCID Take 40 mg by mouth at bedtime.   furosemide 20 MG tablet Commonly known as: Lasix Take 1 tablet (20 mg total) by mouth daily as needed for fluid or edema.   oxyCODONE 5 MG immediate release tablet Commonly known as: Oxy IR/ROXICODONE Take 5 mg by mouth at bedtime.   thyroid 30 MG tablet Commonly known as: ARMOUR Take 30 mg by mouth daily before breakfast.   timolol 0.25 % ophthalmic solution Commonly known as: TIMOPTIC Place 1 drop into both eyes 2 (two) times daily.       Follow-up Information    Courtney Manning Follow up on 09/14/2020.   Specialty: Cardiology Why: at 11:30am. Enter through the [place] entrance Contact information: [place] [place] [address]             Allergies  Allergen Reactions  . Codeine Anaphylaxis    Not Specified  . Brimonidine Tartrate Itching  . Fluoxetine Other (See Comments)    Reaction: Unknown  . Iodine   . Lodine [Etodolac]   . Morphine Itching  . Omeprazole Nausea And Vomiting  . Other Other (See Comments)  . Propoxyphene Other (See Comments)  . Brimonidine Itching and Other (See Comments)  . Ciprofloxacin Rash  . Gabapentin Rash    Consultations:     Procedures/Studies: CT Chest Wo Contrast  Result Date: 08/12/2020 CLINICAL DATA:  Respiratory illness.  Hypoxia. EXAM: CT CHEST WITHOUT CONTRAST TECHNIQUE: Multidetector CT imaging of the chest was performed following the standard protocol without IV contrast. COMPARISON:  X-ray earlier today.  CT chest 10/15/2013 FINDINGS: Cardiovascular: Heart size upper normal. No substantial pericardial effusion. Coronary artery calcification is evident. Atherosclerotic calcification is noted in the wall of the thoracic aorta. Right permanent pacemaker noted. Mediastinum/Nodes: Upper normal lymph nodes are identified in the  mediastinum measuring 10 mm short axis in the precarinal space and 11 mm short axis in the subcarinal station. No evidence for gross hilar lymphadenopathy although assessment is limited by the lack of intravenous contrast on today's study. Moderate hiatal hernia. The esophagus has normal imaging features. There is no axillary lymphadenopathy. Lungs/Pleura: Collapse/consolidative opacity is seen in both lower lobes, right greater than left with associated air bronchograms. Patchy ground-glass attenuation in the lung apices is slightly more prominent on the right. Nodular thickening noted in the right major fissure. Small to moderate bilateral pleural effusions. Upper Abdomen: Subtle micro nodular liver contour raises the question of cirrhosis. Musculoskeletal: No worrisome lytic or sclerotic osseous abnormality. Status post lower thoracic vertebral augmentation. Healed sternal fracture again noted. IMPRESSION: 1. Collapse/consolidative opacity in both lower lobes, right greater than left, with associated air bronchograms. Imaging features suspicious for bibasilar pneumonia. Given subtle nodularity of the right major fissure, follow-up recommended to ensure resolution. 2. Mild mediastinal lymphadenopathy, presumably reactive. Attention on follow-up recommended. 3. Small to moderate bilateral pleural effusions. 4. Subtle micro nodular liver contour raises the question of cirrhosis. 5. Moderate hiatal hernia. 6. Aortic Atherosclerosis (ICD10-I70.0). Electronically  Signed   By: Misty Stanley M.D.   On: 08/12/2020 12:21   US Venous Img Lower Bilateral (DVT)  Result Date: 09/02/2020 CLINICAL DATA:  Leg pain. EXAM: LEFT LOWER EXTREMITY VENOUS DOPPLER ULTRASOUND TECHNIQUE: Gray-scale sonography with compression, as well as color and duplex ultrasound, were performed to evaluate the deep venous system(s) from the level of the common femoral vein through the popliteal and proximal calf veins. COMPARISON:  None. FINDINGS:  VENOUS Normal compressibility of the common femoral, superficial femoral, and popliteal veins, as well as the visualized calf veins. Visualized portions of profunda femoral vein and great saphenous vein unremarkable. No filling defects to suggest DVT on grayscale or color Doppler imaging. Doppler waveforms show normal direction of venous flow, normal respiratory plasticity and response to augmentation. Limited views of the contralateral common femoral vein are unremarkable. IMPRESSION: Negative. Electronically Signed   By: Margaretha Sheffield MD   On: 09/02/2020 11:09   DG Chest Port 1 View  Result Date: 09/02/2020 CLINICAL DATA:  Shortness of breath EXAM: PORTABLE CHEST 1 VIEW COMPARISON:  08/12/2020 FINDINGS: Pleural effusions, interstitial opacity, and cephalized blood flow. Pleural effusions have decreased from prior. Normal heart size. Stable dual-chamber pacer positioning. No pneumothorax. IMPRESSION: CHF appearance. Bilateral pleural effusion are small and have decreased from study 2 weeks ago. Electronically Signed   By: Monte Fantasia M.D.   On: 09/02/2020 05:00   DG Chest Portable 1 View  Result Date: 08/12/2020 CLINICAL DATA:  Shortness of breath and hypoxia. EXAM: PORTABLE CHEST 1 VIEW COMPARISON:  07/31/2020 FINDINGS: 1020 hours. The cardio pericardial silhouette is enlarged. Bibasilar airspace disease compatible with atelectasis or pneumonia is similar to prior. Small bilateral pleural effusions again noted. Interstitial markings are diffusely coarsened with chronic features. Right permanent pacemaker remains in place. Bones are diffusely demineralized with lower thoracic thoracic augmentation. IMPRESSION: No substantial interval change. Bibasilar airspace disease and small bilateral pleural effusions. Electronically Signed   By: Misty Stanley M.D.   On: 08/12/2020 10:29      Subjective: Pt states she ready to get home so she can go to her husband's funeral.    Discharge Exam: Vitals:    09/04/20 0435 09/04/20 0804  BP: (!) 135/48 (!) 148/62  Pulse: 86 78  Resp: 17 18  Temp: 98.1 F (36.7 C) 97.9 F (36.6 C)  SpO2: 91% 99%   Vitals:   09/03/20 1948 09/04/20 0435 09/04/20 0553 09/04/20 0804  BP: 139/60 (!) 135/48  (!) 148/62  Pulse: 89 86  78  Resp: 19 17  18   Temp: 98.7 F (37.1 C) 98.1 F (36.7 C)  97.9 F (36.6 C)  TempSrc: Oral Oral  Oral  SpO2: 92% 91%  99%  Weight:   45.6 kg   Height:        General: Pt is alert, awake, not in acute distress Cardiovascular: S1/S2 +, no rubs, no gallops Respiratory: CTA bilaterally, no wheezing, no rhonchi Abdominal: Soft, NT, ND, bowel sounds + Extremities: no edema, no cyanosis    The results of significant diagnostics from this hospitalization (including imaging, microbiology, ancillary and laboratory) are listed below for reference.     Microbiology: Recent Results (from the past 240 hour(s))  Respiratory Panel by RT PCR (Flu A&B, Covid) - Nasopharyngeal Swab     Status: None   Collection Time: 09/02/20  4:40 AM   Specimen: Nasopharyngeal Swab  Result Value Ref Range Status   SARS Coronavirus 2 by RT PCR NEGATIVE NEGATIVE Final  Comment: (NOTE) SARS-CoV-2 target nucleic acids are NOT DETECTED.  The SARS-CoV-2 RNA is generally detectable in upper respiratoy specimens during the acute phase of infection. The lowest concentration of SARS-CoV-2 viral copies this assay can detect is 131 copies/mL. A negative result does not preclude SARS-Cov-2 infection and should not be used as the sole basis for treatment or other patient management decisions. A negative result may occur with  improper specimen collection/handling, submission of specimen other than nasopharyngeal swab, presence of viral mutation(s) within the areas targeted by this assay, and inadequate number of viral copies (<131 copies/mL). A negative result must be combined with clinical observations, patient history, and epidemiological  information. The expected result is Negative.  Fact Sheet for Patients:  PinkCheek.be  Fact Sheet for Healthcare Providers:  GravelBags.it  This test is no t yet approved or cleared by the [place] FDA and  has been authorized for detection and/or diagnosis of SARS-CoV-2 by FDA under an Emergency Use Authorization (EUA). This EUA will remain  in effect (meaning this test can be used) for the duration of the COVID-19 declaration under Section 564(b)(1) of the Act, 21 U.S.C. section 360bbb-3(b)(1), unless the authorization is terminated or revoked sooner.     Influenza A by PCR NEGATIVE NEGATIVE Final   Influenza B by PCR NEGATIVE NEGATIVE Final    Comment: (NOTE) The Xpert Xpress SARS-CoV-2/FLU/RSV assay is intended as an aid in  the diagnosis of influenza from Nasopharyngeal swab specimens and  should not be used as a sole basis for treatment. Nasal washings and  aspirates are unacceptable for Xpert Xpress SARS-CoV-2/FLU/RSV  testing.  Fact Sheet for Patients: PinkCheek.be  Fact Sheet for Healthcare Providers: GravelBags.it  This test is not yet approved or cleared by the [place] FDA and  has been authorized for detection and/or diagnosis of SARS-CoV-2 by  FDA under an Emergency Use Authorization (EUA). This EUA will remain  in effect (meaning this test can be used) for the duration of the  Covid-19 declaration under Section 564(b)(1) of the Act, 21  U.S.C. section 360bbb-3(b)(1), unless the authorization is  terminated or revoked. Performed at [facility], [address] [place], [place] [address]   Culture, blood (routine x 2)     Status: None (Preliminary result)   Collection Time: 09/02/20  4:45 AM   Specimen: BLOOD RIGHT FOREARM  Result Value Ref Range Status   Specimen Description   Final    BLOOD RIGHT FOREARM Performed at  [facility], [address], [place], [place] [address]    Special Requests   Final    BOTTLES DRAWN AEROBIC AND ANAEROBIC Blood Culture adequate volume Performed at [facility], [address] [place], [place] [address]    Culture  Setup Time   Final    GRAM POSITIVE COCCI IN BOTH AEROBIC AND ANAEROBIC BOTTLES CRITICAL RESULT CALLED TO, READ BACK BY AND VERIFIED WITH: JASON ROBINS AT 2239 ON 09/02/20 BY SS    Culture GRAM POSITIVE COCCI  Final   Report Status PENDING  Incomplete  Blood Culture ID Panel (Reflexed)     Status: Abnormal   Collection Time: 09/02/20  4:45 AM  Result Value Ref Range Status   Enterococcus faecalis NOT DETECTED NOT DETECTED Final   Enterococcus Faecium NOT DETECTED NOT DETECTED Final   Listeria monocytogenes NOT DETECTED NOT DETECTED Final   Staphylococcus species DETECTED (A) NOT DETECTED Final    Comment: CRITICAL RESULT CALLED TO, READ BACK BY AND  VERIFIED WITH: JASON ROBINS AT 2239 ON 09/02/20 BY SS    Staphylococcus aureus (BCID) NOT DETECTED NOT DETECTED Final   Staphylococcus epidermidis NOT DETECTED NOT DETECTED Final   Staphylococcus lugdunensis NOT DETECTED NOT DETECTED Final   Streptococcus species NOT DETECTED NOT DETECTED Final   Streptococcus agalactiae NOT DETECTED NOT DETECTED Final   Streptococcus pneumoniae NOT DETECTED NOT DETECTED Final   Streptococcus pyogenes NOT DETECTED NOT DETECTED Final   A.calcoaceticus-baumannii NOT DETECTED NOT DETECTED Final   Bacteroides fragilis NOT DETECTED NOT DETECTED Final   Enterobacterales NOT DETECTED NOT DETECTED Final   Enterobacter cloacae complex NOT DETECTED NOT DETECTED Final   Escherichia coli NOT DETECTED NOT DETECTED Final   Klebsiella aerogenes NOT DETECTED NOT DETECTED Final   Klebsiella oxytoca NOT DETECTED NOT DETECTED Final   Klebsiella pneumoniae NOT DETECTED NOT DETECTED Final   Proteus species NOT DETECTED NOT DETECTED Final   Salmonella species NOT DETECTED NOT  DETECTED Final   Serratia marcescens NOT DETECTED NOT DETECTED Final   Haemophilus influenzae NOT DETECTED NOT DETECTED Final   Neisseria meningitidis NOT DETECTED NOT DETECTED Final   Pseudomonas aeruginosa NOT DETECTED NOT DETECTED Final   Stenotrophomonas maltophilia NOT DETECTED NOT DETECTED Final   Candida albicans NOT DETECTED NOT DETECTED Final   Candida auris NOT DETECTED NOT DETECTED Final   Candida glabrata NOT DETECTED NOT DETECTED Final   Candida krusei NOT DETECTED NOT DETECTED Final   Candida parapsilosis NOT DETECTED NOT DETECTED Final   Candida tropicalis NOT DETECTED NOT DETECTED Final   Cryptococcus neoformans/gattii NOT DETECTED NOT DETECTED Final    Comment: Performed at [facility], [address] [place], [place] [address]  Culture, blood (routine x 2)     Status: None (Preliminary result)   Collection Time: 09/02/20  6:34 PM   Specimen: BLOOD  Result Value Ref Range Status   Specimen Description BLOOD RIGHT ANTECUBITAL  Final   Special Requests   Final    BOTTLES DRAWN AEROBIC AND ANAEROBIC Blood Culture adequate volume   Culture   Final    NO GROWTH 2 DAYS Performed at [facility], [address] [place], [place] [address]    Report Status PENDING  Incomplete     Labs: BNP (last 3 results) Recent Labs    07/30/20 0835 09/02/20 0439  BNP 464.3* 742.5*   Basic Metabolic Panel: Recent Labs  Lab 09/02/20 0439 09/03/20 0426 09/04/20 0355  NA 135 135 135  K 5.0 3.7 3.8  CL 104 101 101  CO2 22 23 24   GLUCOSE 327* 90 93  BUN 27* 25* 26*  CREATININE 1.96* 1.83* 1.75*  CALCIUM 9.0 9.1 9.0  MG  --  2.1  --    Liver Function Tests: Recent Labs  Lab 09/02/20 0439  AST 34  ALT 20  ALKPHOS 62  BILITOT 1.0  PROT 7.5  ALBUMIN 4.2   No results for input(s): LIPASE, AMYLASE in the last 168 hours. No results for input(s): AMMONIA in the last 168 hours. CBC: Recent Labs  Lab 09/02/20 0439 09/03/20 0426  WBC 8.6 8.3   NEUTROABS 5.7  --   HGB 12.2 9.9*  HCT 38.8 29.8*  MCV 94.4 89.0  PLT 302 229   Cardiac Enzymes: No results for input(s): CKTOTAL, CKMB, CKMBINDEX, TROPONINI in the last 168 hours. BNP: Invalid input(s): POCBNP CBG: Recent Labs  Lab 09/02/20 0903  GLUCAP 80   D-Dimer No results for input(s): DDIMER in the last 72 hours.  Hgb A1c Recent Labs    09/03/20 0426  HGBA1C 5.4   Lipid Profile Recent Labs    09/03/20 0426  CHOL 252*  HDL 63  LDLCALC 173*  TRIG 81  CHOLHDL 4.0   Thyroid function studies No results for input(s): TSH, T4TOTAL, T3FREE, THYROIDAB in the last 72 hours.  Invalid input(s): FREET3 Anemia work up No results for input(s): VITAMINB12, FOLATE, FERRITIN, TIBC, IRON, RETICCTPCT in the last 72 hours. Urinalysis    Component Value Date/Time   COLORURINE YELLOW (A) 07/30/2020 0835   APPEARANCEUR HAZY (A) 07/30/2020 0835   APPEARANCEUR Cloudy 12/26/2013 1622   LABSPEC 1.013 07/30/2020 0835   LABSPEC 1.011 12/26/2013 1622   PHURINE 5.0 07/30/2020 0835   GLUCOSEU 150 (A) 07/30/2020 0835   GLUCOSEU Negative 12/26/2013 1622   HGBUR NEGATIVE 07/30/2020 0835   BILIRUBINUR NEGATIVE 07/30/2020 0835   BILIRUBINUR Negative 12/26/2013 1622   KETONESUR NEGATIVE 07/30/2020 0835   PROTEINUR NEGATIVE 07/30/2020 0835   NITRITE NEGATIVE 07/30/2020 0835   LEUKOCYTESUR NEGATIVE 07/30/2020 0835   LEUKOCYTESUR 3+ 12/26/2013 1622   Sepsis Labs Invalid input(s): PROCALCITONIN,  WBC,  LACTICIDVEN Microbiology Recent Results (from the past 240 hour(s))  Respiratory Panel by RT PCR (Flu A&B, Covid) - Nasopharyngeal Swab     Status: None   Collection Time: 09/02/20  4:40 AM   Specimen: Nasopharyngeal Swab  Result Value Ref Range Status   SARS Coronavirus 2 by RT PCR NEGATIVE NEGATIVE Final    Comment: (NOTE) SARS-CoV-2 target nucleic acids are NOT DETECTED.  The SARS-CoV-2 RNA is generally detectable in upper respiratoy specimens during the acute phase of  infection. The lowest concentration of SARS-CoV-2 viral copies this assay can detect is 131 copies/mL. A negative result does not preclude SARS-Cov-2 infection and should not be used as the sole basis for treatment or other patient management decisions. A negative result may occur with  improper specimen collection/handling, submission of specimen other than nasopharyngeal swab, presence of viral mutation(s) within the areas targeted by this assay, and inadequate number of viral copies (<131 copies/mL). A negative result must be combined with clinical observations, patient history, and epidemiological information. The expected result is Negative.  Fact Sheet for Patients:  PinkCheek.be  Fact Sheet for Healthcare Providers:  GravelBags.it  This test is no t yet approved or cleared by the [place] FDA and  has been authorized for detection and/or diagnosis of SARS-CoV-2 by FDA under an Emergency Use Authorization (EUA). This EUA will remain  in effect (meaning this test can be used) for the duration of the COVID-19 declaration under Section 564(b)(1) of the Act, 21 U.S.C. section 360bbb-3(b)(1), unless the authorization is terminated or revoked sooner.     Influenza A by PCR NEGATIVE NEGATIVE Final   Influenza B by PCR NEGATIVE NEGATIVE Final    Comment: (NOTE) The Xpert Xpress SARS-CoV-2/FLU/RSV assay is intended as an aid in  the diagnosis of influenza from Nasopharyngeal swab specimens and  should not be used as a sole basis for treatment. Nasal washings and  aspirates are unacceptable for Xpert Xpress SARS-CoV-2/FLU/RSV  testing.  Fact Sheet for Patients: PinkCheek.be  Fact Sheet for Healthcare Providers: GravelBags.it  This test is not yet approved or cleared by the [place] FDA and  has been authorized for detection and/or diagnosis of SARS-CoV-2  by  FDA under an Emergency Use Authorization (EUA). This EUA will remain  in effect (meaning this test can be used) for the duration of the  Covid-19 declaration  under Section 564(b)(1) of the Act, 21  U.S.C. section 360bbb-3(b)(1), unless the authorization is  terminated or revoked. Performed at [facility], [address] [place], [place] [address]   Culture, blood (routine x 2)     Status: None (Preliminary result)   Collection Time: 09/02/20  4:45 AM   Specimen: BLOOD RIGHT FOREARM  Result Value Ref Range Status   Specimen Description   Final    BLOOD RIGHT FOREARM Performed at [facility], [place] [address] [place], [place] [address]    Special Requests   Final    BOTTLES DRAWN AEROBIC AND ANAEROBIC Blood Culture adequate volume Performed at [facility], [address] [place], [place] [address]    Culture  Setup Time   Final    GRAM POSITIVE COCCI IN BOTH AEROBIC AND ANAEROBIC BOTTLES CRITICAL RESULT CALLED TO, READ BACK BY AND VERIFIED WITH: JASON ROBINS AT 2239 ON 09/02/20 BY SS    Culture GRAM POSITIVE COCCI  Final   Report Status PENDING  Incomplete  Blood Culture ID Panel (Reflexed)     Status: Abnormal   Collection Time: 09/02/20  4:45 AM  Result Value Ref Range Status   Enterococcus faecalis NOT DETECTED NOT DETECTED Final   Enterococcus Faecium NOT DETECTED NOT DETECTED Final   Listeria monocytogenes NOT DETECTED NOT DETECTED Final   Staphylococcus species DETECTED (A) NOT DETECTED Final    Comment: CRITICAL RESULT CALLED TO, READ BACK BY AND VERIFIED WITH: JASON ROBINS AT 2239 ON 09/02/20 BY SS    Staphylococcus aureus (BCID) NOT DETECTED NOT DETECTED Final   Staphylococcus epidermidis NOT DETECTED NOT DETECTED Final   Staphylococcus lugdunensis NOT DETECTED NOT DETECTED Final   Streptococcus species NOT DETECTED NOT DETECTED Final   Streptococcus agalactiae NOT DETECTED NOT DETECTED Final   Streptococcus pneumoniae NOT DETECTED  NOT DETECTED Final   Streptococcus pyogenes NOT DETECTED NOT DETECTED Final   A.calcoaceticus-baumannii NOT DETECTED NOT DETECTED Final   Bacteroides fragilis NOT DETECTED NOT DETECTED Final   Enterobacterales NOT DETECTED NOT DETECTED Final   Enterobacter cloacae complex NOT DETECTED NOT DETECTED Final   Escherichia coli NOT DETECTED NOT DETECTED Final   Klebsiella aerogenes NOT DETECTED NOT DETECTED Final   Klebsiella oxytoca NOT DETECTED NOT DETECTED Final   Klebsiella pneumoniae NOT DETECTED NOT DETECTED Final   Proteus species NOT DETECTED NOT DETECTED Final   Salmonella species NOT DETECTED NOT DETECTED Final   Serratia marcescens NOT DETECTED NOT DETECTED Final   Haemophilus influenzae NOT DETECTED NOT DETECTED Final   Neisseria meningitidis NOT DETECTED NOT DETECTED Final   Pseudomonas aeruginosa NOT DETECTED NOT DETECTED Final   Stenotrophomonas maltophilia NOT DETECTED NOT DETECTED Final   Candida albicans NOT DETECTED NOT DETECTED Final   Candida auris NOT DETECTED NOT DETECTED Final   Candida glabrata NOT DETECTED NOT DETECTED Final   Candida krusei NOT DETECTED NOT DETECTED Final   Candida parapsilosis NOT DETECTED NOT DETECTED Final   Candida tropicalis NOT DETECTED NOT DETECTED Final   Cryptococcus neoformans/gattii NOT DETECTED NOT DETECTED Final    Comment: Performed at [facility], [address] [place], [place] [address]  Culture, blood (routine x 2)     Status: None (Preliminary result)   Collection Time: 09/02/20  6:34 PM   Specimen: BLOOD  Result Value Ref Range Status   Specimen Description BLOOD RIGHT ANTECUBITAL  Final   Special Requests   Final    BOTTLES DRAWN AEROBIC AND ANAEROBIC Blood  Culture adequate volume   Culture   Final    NO GROWTH 2 DAYS Performed at [facility], [address] [place], [place] [address]    Report Status PENDING  Incomplete     Time coordinating discharge: Over 30 minutes  SIGNED:   Wyvonnia Dusky, MD  [facility] 09/04/2020, 12:15 PM Pager   If 7PM-7AM, please contact night-coverage [email or web address]

## 2020-09-04 NOTE — Discharge Instructions (Signed)
Heart Failure, Self Care Heart failure is a serious condition. This document explains the things you need to do to take care of yourself after a heart failure diagnosis. You may be asked to change your diet, take certain medicines, and make other lifestyle changes in order to stay as healthy as possible. Your health care provider may also give you more specific instructions. If you have problems or questions, contact your health care provider. What are the risks? Having heart failure puts you at higher risk for certain problems. These problems can get worse if you do not take good care of yourself. Problems may include:  Blood clotting problems. This may cause a stroke.  Damage to the kidneys, liver, or lungs.  Abnormal heart rhythms. Supplies needed:  Scale for monitoring weight.  Blood pressure monitor.  Notebook.  Medicines. How to care for yourself when you have heart failure Medicines Take over-the-counter and prescription medicines only as told by your health care provider. Medicines reduce the workload of your heart, slow the progression of heart failure, and improve symptoms. Take your medicines every day.  Do not stop taking your medicine unless your health care provider tells you to do so.  Do not skip any dose of medicine.  Refill your prescriptions before you run out of medicine. Eating and drinking   Eat heart-healthy foods. Talk with a dietitian to make an eating plan that is right for you. ? Choose foods that contain no trans fat and are low in saturated fat and cholesterol. Healthy choices include fresh or frozen fruits and vegetables, fish, lean meats, legumes, fat-free or low-fat dairy products, and whole-grain or high-fiber foods. ? Limit salt (sodium) if told by your health care provider. Sodium restriction may reduce symptoms of heart failure. Ask a dietitian to recommend heart-healthy seasonings. ? Use healthy cooking methods instead of frying. Healthy methods  include roasting, grilling, broiling, baking, poaching, steaming, and stir-frying.  Limit your fluid intake, if directed by your health care provider. Fluid restriction may reduce symptoms of heart failure. Alcohol use  Do not drink alcohol if: ? Your health care provider tells you not to drink. ? Your heart was damaged by alcohol, or you have severe heart failure. ? You are pregnant, may be pregnant, or are planning to become pregnant.  If you drink alcohol: ? Limit how much you use to:  0-1 drink a day for women.  0-2 drinks a day for men. ? Be aware of how much alcohol is in your drink. In the [place] one drink equals one 12 oz bottle of beer (355 mL), one 5 oz glass of wine (148 mL), or one 1 oz glass of hard liquor (44 mL). Lifestyle   Do not use any products that contain nicotine or tobacco, such as cigarettes, e-cigarettes, and chewing tobacco. If you need help quitting, ask your health care provider. ? Do not use nicotine gum or patches before talking to your health care provider.  Do not use illegal drugs.  Work with your health care provider to safely reach the right body weight.  Do physical activity if told by your health care provider. Talk to your health care provider before you begin an exercise if: ? You are an older adult. ? You have severe heart failure.  Learn to manage stress. If you need help to do this, ask your health care provider.  Participate in or seek rehabilitation as needed to keep or improve your independence and quality of life.  Plan  rest periods when you get tired. Monitoring important information   Weigh yourself every day. This will help you to notice if too much fluid is building up in your body. ? Weigh yourself every morning after you urinate and before you eat breakfast. ? Wear the same amount of clothing each time you weigh yourself. ? Record your daily weight. Provide your health care provider with your weight record.  Monitor and  record your pulse and blood pressure as told by your health care provider. Dealing with extreme temperatures  If the weather is extremely hot: ? Avoid vigorous physical activity. ? Use air conditioning or fans, or find a cooler location. ? Avoid caffeine and alcohol. ? Wear loose-fitting, lightweight, and light-colored clothing.  If the weather is extremely cold: ? Avoid vigorous activity. ? Layer your clothes. ? Wear mittens or gloves, a hat, and a scarf when you go outside. ? Avoid alcohol. Follow these instructions at home:  Stay up to date with vaccines. Pneumococcal and flu (influenza) vaccines are especially important in preventing infections of the airways.  Keep all follow-up visits as told by your health care provider. This is important. Contact a health care provider if you:  Have a rapid weight gain.  Have increasing shortness of breath.  Are unable to participate in your usual physical activities.  Get tired easily.  Cough more than normal, especially with physical activity.  Lose your appetite or feel nauseous.  Have any swelling or more swelling in areas such as your hands, feet, ankles, or abdomen.  Are unable to sleep because it is hard to breathe.  Feel like your heart is beating quickly (palpitations).  Become dizzy or light-headed when you stand up. Get help right away if you:  Have trouble breathing.  Notice or your family notices a change in your awareness, such as having trouble staying awake or concentrating.  Have pain or discomfort in your chest.  Have an episode of fainting (syncope). These symptoms may represent a serious problem that is an emergency. Do not wait to see if the symptoms will go away. Get medical help right away. Call your local emergency services (911 in the [place] Do not drive yourself to the hospital. Summary  Heart failure is a serious condition. To care for yourself, you may be asked to change your diet, take certain  medicines, and make other lifestyle changes.  Take your medicines every day. Do not stop taking them unless your health care provider tells you to do so.  Eat heart-healthy foods, such as fresh or frozen fruits and vegetables, fish, lean meats, legumes, fat-free or low-fat dairy products, and whole-grain or high-fiber foods.  Ask your health care provider if you have any alcohol restrictions. You may have to stop drinking alcohol if you have severe heart failure.  Contact your health care provider if you notice problems, such as rapid weight gain or a fast heartbeat. Get help right away if you faint, or have chest pain or trouble breathing. This information is not intended to replace advice given to you by your health care provider. Make sure you discuss any questions you have with your health care provider. Document Revised: 01/28/2019 Document Reviewed: 01/29/2019 [employer] Patient Education  2020 [employer]  

## 2020-09-05 LAB — CULTURE, BLOOD (ROUTINE X 2): Special Requests: ADEQUATE

## 2020-09-07 LAB — CULTURE, BLOOD (ROUTINE X 2)
Culture: NO GROWTH
Special Requests: ADEQUATE

## 2020-09-14 ENCOUNTER — Ambulatory Visit: Payer: Medicare Other | Admitting: Family

## 2020-09-27 ENCOUNTER — Ambulatory Visit: Payer: Medicare Other | Admitting: Podiatry

## 2020-09-29 DEATH — deceased
